# Patient Record
Sex: Male | Born: 1935
Health system: Southern US, Community
[De-identification: ages and names within clinical notes are randomized; demographics above are authoritative.]

## PROBLEM LIST (undated history)

## (undated) DIAGNOSIS — I5022 Chronic systolic (congestive) heart failure: Secondary | ICD-10-CM

## (undated) DIAGNOSIS — E785 Hyperlipidemia, unspecified: Secondary | ICD-10-CM

## (undated) DIAGNOSIS — C679 Malignant neoplasm of bladder, unspecified: Secondary | ICD-10-CM

## (undated) DIAGNOSIS — I959 Hypotension, unspecified: Secondary | ICD-10-CM

## (undated) DIAGNOSIS — R001 Bradycardia, unspecified: Secondary | ICD-10-CM

## (undated) DIAGNOSIS — I10 Essential (primary) hypertension: Secondary | ICD-10-CM

## (undated) DIAGNOSIS — I2109 ST elevation (STEMI) myocardial infarction involving other coronary artery of anterior wall: Secondary | ICD-10-CM

## (undated) DIAGNOSIS — R739 Hyperglycemia, unspecified: Secondary | ICD-10-CM

## (undated) DIAGNOSIS — I480 Paroxysmal atrial fibrillation: Secondary | ICD-10-CM

## (undated) DIAGNOSIS — M1990 Unspecified osteoarthritis, unspecified site: Secondary | ICD-10-CM

## (undated) DIAGNOSIS — I255 Ischemic cardiomyopathy: Secondary | ICD-10-CM

## (undated) DIAGNOSIS — I2510 Atherosclerotic heart disease of native coronary artery without angina pectoris: Secondary | ICD-10-CM

## (undated) DIAGNOSIS — F0390 Unspecified dementia without behavioral disturbance: Secondary | ICD-10-CM

## (undated) HISTORY — DX: Hypotension, unspecified: I95.9

## (undated) HISTORY — DX: Malignant neoplasm of bladder, unspecified: C67.9

## (undated) HISTORY — DX: Hyperlipidemia, unspecified: E78.5

## (undated) HISTORY — DX: Ischemic cardiomyopathy: I25.5

## (undated) HISTORY — DX: Chronic systolic (congestive) heart failure: I50.22

## (undated) HISTORY — PX: JOINT REPLACEMENT: SHX530

## (undated) HISTORY — DX: Bradycardia, unspecified: R00.1

## (undated) HISTORY — DX: Essential (primary) hypertension: I10

## (undated) HISTORY — PX: CORONARY ANGIOPLASTY WITH STENT PLACEMENT: SHX49

## (undated) HISTORY — PX: CORONARY ARTERY BYPASS GRAFT: SHX141

## (undated) HISTORY — DX: Paroxysmal atrial fibrillation: I48.0

## (undated) HISTORY — DX: Hyperglycemia, unspecified: R73.9

## (undated) HISTORY — PX: TOTAL HIP ARTHROPLASTY: SHX124

---
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
## 1988-10-18 HISTORY — PX: CATARACT EXTRACTION W/ INTRAOCULAR LENS  IMPLANT, BILATERAL: SHX1307

## 1990-02-17 HISTORY — PX: TOTAL HIP ARTHROPLASTY: SHX124

## 1997-12-30 ENCOUNTER — Inpatient Hospital Stay (HOSPITAL_COMMUNITY): Admission: EM | Admit: 1997-12-30 | Discharge: 1998-01-09 | Payer: Self-pay | Admitting: Emergency Medicine

## 1998-01-31 ENCOUNTER — Encounter: Admission: RE | Admit: 1998-01-31 | Discharge: 1998-01-31 | Payer: Self-pay | Admitting: Infectious Diseases

## 1998-03-05 ENCOUNTER — Encounter: Admission: RE | Admit: 1998-03-05 | Discharge: 1998-03-05 | Payer: Self-pay | Admitting: Infectious Diseases

## 1998-03-12 ENCOUNTER — Encounter: Admission: RE | Admit: 1998-03-12 | Discharge: 1998-03-28 | Payer: Self-pay | Admitting: Neurosurgery

## 1998-05-07 ENCOUNTER — Encounter: Admission: RE | Admit: 1998-05-07 | Discharge: 1998-05-07 | Payer: Self-pay | Admitting: Infectious Diseases

## 2001-09-20 ENCOUNTER — Encounter: Admission: RE | Admit: 2001-09-20 | Discharge: 2001-09-20 | Payer: Self-pay | Admitting: Orthopedic Surgery

## 2001-09-20 ENCOUNTER — Encounter: Payer: Self-pay | Admitting: Orthopedic Surgery

## 2002-03-21 ENCOUNTER — Encounter: Payer: Self-pay | Admitting: Orthopedic Surgery

## 2002-03-21 ENCOUNTER — Encounter: Admission: RE | Admit: 2002-03-21 | Discharge: 2002-03-21 | Payer: Self-pay | Admitting: Orthopedic Surgery

## 2002-07-19 ENCOUNTER — Encounter: Payer: Self-pay | Admitting: Orthopedic Surgery

## 2002-07-19 ENCOUNTER — Encounter: Admission: RE | Admit: 2002-07-19 | Discharge: 2002-07-19 | Payer: Self-pay | Admitting: Orthopedic Surgery

## 2002-08-30 ENCOUNTER — Encounter: Payer: Self-pay | Admitting: Emergency Medicine

## 2002-08-30 ENCOUNTER — Inpatient Hospital Stay (HOSPITAL_COMMUNITY): Admission: EM | Admit: 2002-08-30 | Discharge: 2002-09-02 | Payer: Self-pay | Admitting: Unknown Physician Specialty

## 2002-12-05 ENCOUNTER — Encounter: Admission: RE | Admit: 2002-12-05 | Discharge: 2002-12-05 | Payer: Self-pay | Admitting: Orthopedic Surgery

## 2002-12-05 ENCOUNTER — Encounter: Payer: Self-pay | Admitting: Orthopedic Surgery

## 2003-12-25 ENCOUNTER — Inpatient Hospital Stay (HOSPITAL_COMMUNITY): Admission: EM | Admit: 2003-12-25 | Discharge: 2003-12-27 | Payer: Self-pay | Admitting: Emergency Medicine

## 2005-02-17 ENCOUNTER — Inpatient Hospital Stay (HOSPITAL_COMMUNITY): Admission: EM | Admit: 2005-02-17 | Discharge: 2005-02-20 | Payer: Self-pay | Admitting: Emergency Medicine

## 2005-02-17 DIAGNOSIS — C679 Malignant neoplasm of bladder, unspecified: Secondary | ICD-10-CM

## 2005-02-17 HISTORY — DX: Malignant neoplasm of bladder, unspecified: C67.9

## 2005-11-25 ENCOUNTER — Encounter (INDEPENDENT_AMBULATORY_CARE_PROVIDER_SITE_OTHER): Payer: Self-pay | Admitting: *Deleted

## 2005-11-25 ENCOUNTER — Ambulatory Visit (HOSPITAL_BASED_OUTPATIENT_CLINIC_OR_DEPARTMENT_OTHER): Admission: RE | Admit: 2005-11-25 | Discharge: 2005-11-25 | Payer: Self-pay | Admitting: Urology

## 2005-12-04 ENCOUNTER — Encounter: Admission: RE | Admit: 2005-12-04 | Discharge: 2005-12-04 | Payer: Self-pay | Admitting: Orthopedic Surgery

## 2006-02-23 ENCOUNTER — Inpatient Hospital Stay (HOSPITAL_COMMUNITY): Admission: RE | Admit: 2006-02-23 | Discharge: 2006-02-26 | Payer: Self-pay | Admitting: Orthopedic Surgery

## 2006-09-18 HISTORY — PX: CARDIAC CATHETERIZATION: SHX172

## 2006-11-06 ENCOUNTER — Inpatient Hospital Stay (HOSPITAL_COMMUNITY): Admission: EM | Admit: 2006-11-06 | Discharge: 2006-11-07 | Payer: Self-pay | Admitting: Emergency Medicine

## 2006-11-18 ENCOUNTER — Inpatient Hospital Stay (HOSPITAL_COMMUNITY): Admission: EM | Admit: 2006-11-18 | Discharge: 2006-11-19 | Payer: Self-pay | Admitting: Emergency Medicine

## 2007-10-09 ENCOUNTER — Emergency Department (HOSPITAL_COMMUNITY): Admission: EM | Admit: 2007-10-09 | Discharge: 2007-10-09 | Payer: Self-pay | Admitting: Emergency Medicine

## 2007-11-16 ENCOUNTER — Inpatient Hospital Stay (HOSPITAL_COMMUNITY): Admission: EM | Admit: 2007-11-16 | Discharge: 2007-11-17 | Payer: Self-pay | Admitting: Emergency Medicine

## 2009-09-17 DIAGNOSIS — I2109 ST elevation (STEMI) myocardial infarction involving other coronary artery of anterior wall: Secondary | ICD-10-CM

## 2009-09-17 HISTORY — DX: ST elevation (STEMI) myocardial infarction involving other coronary artery of anterior wall: I21.09

## 2010-04-18 ENCOUNTER — Inpatient Hospital Stay (HOSPITAL_BASED_OUTPATIENT_CLINIC_OR_DEPARTMENT_OTHER)
Admission: RE | Admit: 2010-04-18 | Discharge: 2010-04-18 | Disposition: A | Discharge status: Home / Self Care | Payer: Medicare Other | Source: Ambulatory Visit | Attending: Interventional Cardiology | Admitting: Interventional Cardiology

## 2010-04-18 DIAGNOSIS — I25810 Atherosclerosis of coronary artery bypass graft(s) without angina pectoris: Secondary | ICD-10-CM | POA: Insufficient documentation

## 2010-04-18 DIAGNOSIS — T82897A Other specified complication of cardiac prosthetic devices, implants and grafts, initial encounter: Secondary | ICD-10-CM | POA: Insufficient documentation

## 2010-04-18 DIAGNOSIS — Y831 Surgical operation with implant of artificial internal device as the cause of abnormal reaction of the patient, or of later complication, without mention of misadventure at the time of the procedure: Secondary | ICD-10-CM | POA: Insufficient documentation

## 2010-04-18 DIAGNOSIS — I2582 Chronic total occlusion of coronary artery: Secondary | ICD-10-CM | POA: Insufficient documentation

## 2010-04-23 ENCOUNTER — Encounter (INDEPENDENT_AMBULATORY_CARE_PROVIDER_SITE_OTHER): Payer: Medicare Other | Admitting: Surgery

## 2010-04-23 DIAGNOSIS — I2510 Atherosclerotic heart disease of native coronary artery without angina pectoris: Secondary | ICD-10-CM

## 2010-04-25 ENCOUNTER — Telehealth: Payer: Self-pay | Admitting: Surgery

## 2010-04-26 NOTE — Consult Note (Signed)
NEW PATIENT CONSULTATION  Shawn [name], Shawn [name] DOB:  [birth year]                                        April 25, 2010 CHART #:  [id number]  REFERRING PHYSICIAN:  Lyn Records, M.D.  REASON FOR CONSULTATION:  Severe multivessel native coronary artery disease and saphenous vein graft disease status post coronary artery bypass graft surgery in 1992.  CLINICAL HISTORY:  I was asked to by Dr. Katrinka Blazing to evaluate the patient for consideration of redo coronary artery bypass graft surgery.  He is a 75 year old gentleman who underwent coronary artery bypass graft surgery in April 07, 1990, by Dr. Particia Lather.  He had a left internal mammary graft to the obtuse marginal as well as saphenous vein graft to the LAD and a saphenous vein graft to the right coronary artery.  I reviewed his previous operative note and it did not completely explain why his left internal mammary graft was placed to the obtuse marginal instead of the LAD, but I suspect it was due to an acute occlusion of the left circumflex and the fact that the LAD and right coronary artery has only had about 60% stenosis in them.  The patient subsequently had multiple further procedures on his heart.  He was noted to have an occluded left internal mammary graft to the obtuse marginal as well as an occluded saphenous vein graft to the right coronary patent IMA.  He underwent stenting of his left circumflex with a Cypher stent in 2004 and 2005.  He had a bare metal stent to the saphenous vein graft to the LAD in 2008.  He presented to [facility] in [place] in August 2011 with myocardial infarction and had 2 further stents placed in the saphenous vein graft to the LAD.  He was seen by Dr. Katrinka Blazing on April 11, 2010, for routine followup and it was felt that he should undergo repeat catheterization to reevaluate his coronaries and saphenous vein graft given his intervention in August and his  prior problems.  The patient denied any symptoms.  He did undergo cardiac catheterization on April 18, 2010.  The native coronary showed that the left main was widely patent.  The LAD was totally occluded after a large branching first septal perforator.  The left circumflex was a dominant vessel that trifurcated on the left lateral wall.  It contains stents in the mid body of the left circumflex as well as the obtuse marginal. There was diffuse disease throughout the entire midregion including a moderate degree of in-stent restenosis with obstruction of the 50%.  The right coronary artery was totally occluded in midvessel with some collaterals to the distal vessel.  The distal right coronary artery appeared fairly small in all shots.  It was unclear if this was due to underfilling.  The saphenous vein graft to the right coronary artery was totally occluded.  Saphenous vein graft to the LAD had multiple stents including overlapping stents and a segmental long stent in the distal graft.  Distally at the anastomosis, there was diffuse in-stent restenosis up to 70% and a focal eccentric 90% plus stenosis in the midbody of the saphenous vein graft in a previously unstented segment. The right internal mammary artery was widely patent.  REVIEW OF SYSTEMS:  GENERAL:  He denies any fever or chills.  He had no recent weight changes.  He denies fatigue and cough at least three times per week. EYES:  Negative. ENT:  He is hard of hearing.  He wears bilateral hearing aids. ENDOCRINE:  He denies diabetes and hypothyroidism. CARDIOVASCULAR:  He denies any chest pain or pressure.  He has had no PND or orthopnea.  He denies exertional dyspnea.  He does have a history of atrial flutter that he says he was diagnosed about 8 months ago and he has been on Coumadin since.  He denies peripheral edema. RESPIRATORY:  Denies cough and sputum production. GI:  He has had no nausea or vomiting.  Denies melena and  bright red blood per rectum. GU:  He denies dysuria and hematuria. MUSCULOSKELETAL:  He does have arthralgias. VASCULAR:  He denies claudication or phlebitis. NEUROLOGICAL:  He denies any focal weakness or numbness.  He denies dizziness and syncope.  He has never had TIA or stroke. HEMATOLOGICAL:  Negative.  ALLERGIES:  Penicillin, which causes a rash.  MEDICATIONS:  Colchicine 0.6 mg p.r.n., vitamin D 2000 units daily, Zocor 80 mg daily, atenolol 25 mg daily, Coumadin 5 mg daily, Plavix 75 mg daily, sublingual nitroglycerin p.r.n., and aspirin 81 mg daily.  PAST MEDICAL HISTORY:  Hypertension, hyperlipidemia.  He has history of coronary artery disease status post coronary artery bypass graft surgery in 1992.  As mentioned above, he is status post multiple percutaneous interventions.  He has history of bladder cancer in 2007 with no recurrence.  He has a history of vertebral osteomyelitis and diskitis in the past.  He has a history of impaired fasting glucose.  He has a history of hearing loss.  He has a history of bilateral total hip replacement.  He has a history of cataracts.  He is status post appendectomy.  FAMILY HISTORY:  His father died of stroke and high blood pressure. Mother died after surgery for brain tumor.  He has a brother who is alive with leukemia and congestive heart failure.  SOCIAL HISTORY:  He is married and retired.  He has 3 adult children. He is a previous smoker who quit age 87.  He denies alcohol use.  PHYSICAL EXAMINATION:  General:  He is a well-developed, elderly white male in no distress.  Vital Signs:  Blood pressure is 122/75, pulse is 45 and regular, respiratory rate is 16 and unlabored, oxygen saturation on room air is 99%.  HEENT:  Normocephalic and atraumatic.  Pupils are equal and reactive to light and accommodation.  Extraocular muscles are intact.  Oropharynx is clear.  Neck:  Normal carotid pulses bilaterally. There are bruits.  There is  no adenopathy or thyromegaly.  Cardiac: Regular rate and rhythm with normal S1 and S2.  There is no murmur, rub, or gallop.  Lungs:  Clear.  There is well-healed sternotomy incision. Sternum is stable.  Abdomen:  Active bowel sounds.  Abdomen is soft, flat, and nontender.  There are no palpable masses or organomegaly. Extremity:  No peripheral edema.  There is a right lower leg saphenous vein harvest incision from the ankle to the knee.  Pedal pulses are palpable bilaterally.  Skin:  Warm and dry.  Neurologic:  Shows him to be alert and oriented x3.  Motor and sensory exam is grossly normal.  IMPRESSION:  The patient has severe native three-vessel coronary disease with a highly diseased saphenous vein graft supplying the left anterior descending and diagonal system.  This vein graft has had multiple percutaneous interventions and currently has a 90% midgraft stenosis in  the unstented segment.  I agree that his best long-term prognosis is going to be with redo coronary artery bypass graft surgery.  I discussed the operative procedure with him including alternatives, benefits, and risks including but not limited to bleeding, blood transfusion, infection, stroke, myocardial infarction, graft failure, and death.  He understands all this and agrees to proceed.  He is going to talk this over with his wife and then will call our office to schedule surgery in the near future.  Evelene Croon, M.D. Electronically Signed  BB/MEDQ  D:  04/25/2010  T:  04/26/2010  Job:  [id number]  cc:   Theressa Millard, M.D. Lyn Records, M.D.

## 2010-04-26 NOTE — Procedures (Signed)
Shawn [name], PAYSON NO.:  [id number]  MEDICAL RECORD NO.:  [id number]          PATIENT TYPE:  LOCATION:                                 FACILITY:  PHYSICIAN:  Lyn Records, M.D.   DATE OF BIRTH:  12/09/1935  DATE OF PROCEDURE:  04/18/2010 DATE OF DISCHARGE:                           CARDIAC CATHETERIZATION   INDICATIONS FOR PROCEDURE:  The patient has a history of coronary artery disease.  He had bypass surgery in 1992 by Dr. Particia Lather.  At that time, he received a left internal mammary graft to the obtuse marginal, saphenous vein graft to the LAD, and saphenous vein graft to the RCA. Subsequent catheterizations have identified atresia of the LIMA to the obtuse marginal requiring stenting with drug-eluting stent in 2004 and 2005.  Those stents have remained patent.  He has subsequently been demonstrated to have occlusion of the saphenous vein graft to the RCA and has had recurrent occlusion with 3 separate episodes of acute coronary syndrome or acute infarction involving the saphenous vein graft to the LAD.  He received a bare metal stent to the SVG in 2008.  He required restenting during an acute anterior MI in August 2011 where a 32-mm long bare metal stent was again placed in this graft.  The patient is asymptomatic at this time, but is at high risk for restenosis.  This cath is being done to rule out restenosis in the saphenous vein graft and to consider preemptive alternative revascularization.  PROCEDURE PERFORMED: 1. Left heart catheterization. 2. Selective coronary angiography. 3. Left ventriculography. 4. Right internal mammary graft angiography. 5. Saphenous vein graft angiography.  DESCRIPTION:  [name] was sterilely prepped and draped.  A time-out was confirmed.  He has understanding of the indication for the procedure.  He was given Versed and fentanyl for sedation.  We then performed heart catheterization using 4-French  equipment from the right femoral artery.  The 4-French sheath was placed by the modified Seldinger technique.  A 4-French A2 multipurpose catheter was used for hemodynamic recordings, left ventriculography hand injection, native right coronary angiography, and saphenous vein graft angiography.  A 4- [place] #4 left Judkins catheter was used for left coronary angiography. A 4-French internal mammary catheter was used for right internal mammary artery angiography.  The patient tolerated the procedure without complications.  RESULTS: 1. Hemodynamic data:     a.     Aortic pressure 108/50.     b.     Left ventricular pressure 110/14. 2. Left ventriculography:  Left ventricle is mildly dilated.  Anterior     and inferior wall hypokinesis is noted.  EF is 35% to 40%.  No     obvious mitral regurgitation is seen. 3. Coronary angiography.     a.     Left main coronary artery:  Widely patent.     b.     Left anterior descending coronary artery:  LAD is totally      occluded after a large branching first septal perforator.  30% to      40% narrowing is noted in the proximal LAD.  The septal perforator  and its branches supply collaterals to the distal RCA in the AV      groove.     c.     Circumflex coronary artery:  The circumflex is dominant      vessel that trifurcates on the left lateral wall.  It contains      stents in the midbody of the circumflex/obtuse marginal.  Diffuse      disease throughout the entire mid region is noted including      moderate degree of in-stent restenosis with obstruction up to 50%.     d.     Right coronary artery:  Totally occluded in the midvessel      also giving formal collaterals to the distal right coronary      artery.  The distal right coronary artery bed appears relatively      small in all shots potentially because of underfilling due to      multiple sources of collateral flow. 4. Saphenous vein graft angiography:     a.     SVG to RCA:  Totally  occluded.     b.     Saphenous vein graft to LAD:  Multiple stents including      overlapping stents in the proximal LAD is noted and segmental long      stent is noted in the distal graft extending into the LAD.      Distally at the anastomosis, there is diffuse in-stent restenosis      with up to 70% narrowing.  There is a focal eccentric 90 plus      percent stenosis in the midbody of the saphenous vein graft in a      previously unstented segment. 5. RIMA:  The right internal mammary is widely patent.  CONCLUSIONS: 1. Saphenous vein graft failure with restenosis and high-grade new     native vessel disease in the SVG to the LAD.  This graft contains     multiple bare metal stents and has been the source of recurring     episodes of ACS including more recently had an acute anterior     infarct in August 2011. 2. Patent native circumflex with generalized 50% obstruction     throughout the mid segment of this vessel.  Patent drug-eluting     stents were present.  The right coronary artery is totally     occluded.  The native LAD is totally occluded. 3. Total occlusion of saphenous vein graft to the right coronary     artery. 4. Widely patent right internal mammary. 5. Mild-to-moderate left ventricular dysfunction.  PLAN:  Referral to [facility] for consideration of right internal mammary grafting to the LAD diagonal versus continued restenting of this 73 year old graft, which seems unreasonable.  The patient is currently asymptomatic so we have a little time, but I will try to get him in to see a surgeon as soon as possible.     Lyn Records, M.D.     HWS/MEDQ  D:  04/18/2010  T:  04/18/2010  Job:  [id number]  cc:   [facility]  Electronically Signed by Verdis Prime M.D. on 04/25/2010 56:21:30 AM

## 2010-04-29 NOTE — Telephone Encounter (Signed)
Spoke with patient on 04/26/10 to provide him with instructions for discontinuance of his Plavix and Coumadin in preparation for his 05/02/10 surgery with Dr. Laneta Simmers.

## 2010-04-30 ENCOUNTER — Ambulatory Visit (HOSPITAL_COMMUNITY)
Admission: RE | Admit: 2010-04-30 | Discharge: 2010-04-30 | Disposition: A | Discharge status: Home / Self Care | Payer: Medicare Other | Source: Ambulatory Visit | Attending: Surgery | Admitting: Surgery

## 2010-04-30 ENCOUNTER — Encounter (HOSPITAL_COMMUNITY)
Admission: RE | Admit: 2010-04-30 | Discharge: 2010-04-30 | Disposition: A | Discharge status: Home / Self Care | Payer: Medicare Other | Source: Ambulatory Visit | Attending: Surgery | Admitting: Surgery

## 2010-04-30 ENCOUNTER — Other Ambulatory Visit: Payer: Self-pay | Admitting: Surgery

## 2010-04-30 DIAGNOSIS — Z01812 Encounter for preprocedural laboratory examination: Secondary | ICD-10-CM | POA: Insufficient documentation

## 2010-04-30 DIAGNOSIS — I2510 Atherosclerotic heart disease of native coronary artery without angina pectoris: Secondary | ICD-10-CM

## 2010-04-30 DIAGNOSIS — Z01811 Encounter for preprocedural respiratory examination: Secondary | ICD-10-CM | POA: Insufficient documentation

## 2010-04-30 DIAGNOSIS — Z01810 Encounter for preprocedural cardiovascular examination: Secondary | ICD-10-CM | POA: Insufficient documentation

## 2010-04-30 DIAGNOSIS — Z01818 Encounter for other preprocedural examination: Secondary | ICD-10-CM | POA: Insufficient documentation

## 2010-04-30 LAB — COMPREHENSIVE METABOLIC PANEL
Albumin: 3.5 g/dL (ref 3.5–5.2)
BUN: 16 mg/dL (ref 6–23)
Creatinine, Ser: 0.93 mg/dL (ref 0.4–1.5)
Glucose, Bld: 129 mg/dL — ABNORMAL HIGH (ref 70–99)
Total Bilirubin: 1 mg/dL (ref 0.3–1.2)
Total Protein: 6.1 g/dL (ref 6.0–8.3)

## 2010-04-30 LAB — BLOOD GAS, ARTERIAL
Acid-Base Excess: 2.4 mmol/L — ABNORMAL HIGH (ref 0.0–2.0)
Drawn by: 181601
pCO2 arterial: 38.6 mmHg (ref 35.0–45.0)
pH, Arterial: 7.446 (ref 7.350–7.450)
pO2, Arterial: 117 mmHg — ABNORMAL HIGH (ref 80.0–100.0)

## 2010-04-30 LAB — URINALYSIS, ROUTINE W REFLEX MICROSCOPIC
Nitrite: NEGATIVE
Specific Gravity, Urine: 1.022 (ref 1.005–1.030)
Urobilinogen, UA: 0.2 mg/dL (ref 0.0–1.0)
pH: 6.5 (ref 5.0–8.0)

## 2010-04-30 LAB — CBC
Platelets: 209 10*3/uL (ref 150–400)
RBC: 4.61 MIL/uL (ref 4.22–5.81)
WBC: 8.2 10*3/uL (ref 4.0–10.5)

## 2010-04-30 LAB — APTT: aPTT: 34 seconds (ref 24–37)

## 2010-04-30 LAB — SURGICAL PCR SCREEN: Staphylococcus aureus: NEGATIVE

## 2010-04-30 LAB — PROTIME-INR: INR: 1.49 (ref 0.00–1.49)

## 2010-05-01 LAB — HEMOGLOBIN A1C: Mean Plasma Glucose: 148 mg/dL — ABNORMAL HIGH (ref <117)

## 2010-05-02 ENCOUNTER — Inpatient Hospital Stay (HOSPITAL_COMMUNITY): Payer: Medicare Other

## 2010-05-02 ENCOUNTER — Inpatient Hospital Stay (HOSPITAL_COMMUNITY)
Admission: RE | Admit: 2010-05-02 | Discharge: 2010-05-06 | DRG: 236 | Disposition: A | Discharge status: Home / Self Care | Payer: Medicare Other | Source: Ambulatory Visit | Attending: Surgery | Admitting: Surgery

## 2010-05-02 DIAGNOSIS — I4892 Unspecified atrial flutter: Secondary | ICD-10-CM | POA: Diagnosis present

## 2010-05-02 DIAGNOSIS — I4891 Unspecified atrial fibrillation: Secondary | ICD-10-CM | POA: Diagnosis present

## 2010-05-02 DIAGNOSIS — Z96649 Presence of unspecified artificial hip joint: Secondary | ICD-10-CM

## 2010-05-02 DIAGNOSIS — H9190 Unspecified hearing loss, unspecified ear: Secondary | ICD-10-CM | POA: Diagnosis present

## 2010-05-02 DIAGNOSIS — Z880 Allergy status to penicillin: Secondary | ICD-10-CM

## 2010-05-02 DIAGNOSIS — Z7982 Long term (current) use of aspirin: Secondary | ICD-10-CM

## 2010-05-02 DIAGNOSIS — I10 Essential (primary) hypertension: Secondary | ICD-10-CM | POA: Diagnosis present

## 2010-05-02 DIAGNOSIS — Z951 Presence of aortocoronary bypass graft: Secondary | ICD-10-CM

## 2010-05-02 DIAGNOSIS — I2510 Atherosclerotic heart disease of native coronary artery without angina pectoris: Secondary | ICD-10-CM

## 2010-05-02 DIAGNOSIS — Z87891 Personal history of nicotine dependence: Secondary | ICD-10-CM

## 2010-05-02 DIAGNOSIS — E785 Hyperlipidemia, unspecified: Secondary | ICD-10-CM | POA: Diagnosis present

## 2010-05-02 DIAGNOSIS — Z7901 Long term (current) use of anticoagulants: Secondary | ICD-10-CM

## 2010-05-02 DIAGNOSIS — Z8551 Personal history of malignant neoplasm of bladder: Secondary | ICD-10-CM

## 2010-05-02 LAB — POCT I-STAT 4, (NA,K, GLUC, HGB,HCT)
Glucose, Bld: 120 mg/dL — ABNORMAL HIGH (ref 70–99)
Glucose, Bld: 90 mg/dL (ref 70–99)
Glucose, Bld: 107 mg/dL — ABNORMAL HIGH (ref 70–99)
Glucose, Bld: 95 mg/dL (ref 70–99)
HCT: 37 % — ABNORMAL LOW (ref 39.0–52.0)
HCT: 30 % — ABNORMAL LOW (ref 39.0–52.0)
HCT: 28 % — ABNORMAL LOW (ref 39.0–52.0)
HCT: 31 % — ABNORMAL LOW (ref 39.0–52.0)
Hemoglobin: 12.9 g/dL — ABNORMAL LOW (ref 13.0–17.0)
Hemoglobin: 12.6 g/dL — ABNORMAL LOW (ref 13.0–17.0)
Hemoglobin: 10.5 g/dL — ABNORMAL LOW (ref 13.0–17.0)
Potassium: 4.1 mEq/L (ref 3.5–5.1)
Potassium: 4.2 mEq/L (ref 3.5–5.1)
Potassium: 3.9 mEq/L (ref 3.5–5.1)
Potassium: 4 mEq/L (ref 3.5–5.1)
Sodium: 143 mEq/L (ref 135–145)
Sodium: 143 mEq/L (ref 135–145)
Sodium: 139 mEq/L (ref 135–145)
Sodium: 142 mEq/L (ref 135–145)

## 2010-05-02 LAB — HEMOGLOBIN AND HEMATOCRIT, BLOOD: Hemoglobin: 9.4 g/dL — ABNORMAL LOW (ref 13.0–17.0)

## 2010-05-02 LAB — CREATININE, SERUM
GFR calc Af Amer: >60 mL/min (ref >60)
GFR calc non Af Amer: >60 mL/min (ref >60)

## 2010-05-02 LAB — POCT I-STAT, CHEM 8
BUN: 15 mg/dL (ref 6–23)
Hemoglobin: 11.2 g/dL — ABNORMAL LOW (ref 13.0–17.0)
Potassium: 5.2 mEq/L — ABNORMAL HIGH (ref 3.5–5.1)
Sodium: 142 mEq/L (ref 135–145)
TCO2: 22 mmol/L (ref 0–100)

## 2010-05-02 LAB — MAGNESIUM: Magnesium: 2.8 mg/dL — ABNORMAL HIGH (ref 1.5–2.5)

## 2010-05-02 LAB — CBC
HCT: 30.4 % — ABNORMAL LOW (ref 39.0–52.0)
Hemoglobin: 10 g/dL — ABNORMAL LOW (ref 13.0–17.0)
Hemoglobin: 11.1 g/dL — ABNORMAL LOW (ref 13.0–17.0)
MCHC: 32.9 g/dL (ref 30.0–36.0)
MCV: 93.1 fL (ref 78.0–100.0)
Platelets: 133 10*3/uL — ABNORMAL LOW (ref 150–400)
RBC: 3.25 MIL/uL — ABNORMAL LOW (ref 4.22–5.81)
RBC: 3.6 MIL/uL — ABNORMAL LOW (ref 4.22–5.81)
WBC: 13.4 10*3/uL — ABNORMAL HIGH (ref 4.0–10.5)
WBC: 18.4 10*3/uL — ABNORMAL HIGH (ref 4.0–10.5)

## 2010-05-02 LAB — POCT I-STAT 3, ART BLOOD GAS (G3+)
Acid-Base Excess: 1 mmol/L (ref 0.0–2.0)
Acid-base deficit: 1 mmol/L (ref 0.0–2.0)
Bicarbonate: 27.6 mEq/L — ABNORMAL HIGH (ref 20.0–24.0)
Bicarbonate: 26.3 mEq/L — ABNORMAL HIGH (ref 20.0–24.0)
O2 Saturation: 98 %
pCO2 arterial: 54.3 mmHg — ABNORMAL HIGH (ref 35.0–45.0)
pCO2 arterial: 42.9 mmHg (ref 35.0–45.0)
pH, Arterial: 7.333 — ABNORMAL LOW (ref 7.350–7.450)
pH, Arterial: 7.286 — ABNORMAL LOW (ref 7.350–7.450)
pO2, Arterial: 92 mmHg (ref 80.0–100.0)

## 2010-05-02 LAB — GLUCOSE, CAPILLARY
Glucose-Capillary: 117 mg/dL — ABNORMAL HIGH (ref 70–99)
Glucose-Capillary: 144 mg/dL — ABNORMAL HIGH (ref 70–99)

## 2010-05-02 LAB — PROTIME-INR
INR: 1.75 — ABNORMAL HIGH (ref 0.00–1.49)
Prothrombin Time: 20.6 seconds — ABNORMAL HIGH (ref 11.6–15.2)

## 2010-05-02 LAB — APTT: aPTT: 36 seconds (ref 24–37)

## 2010-05-02 LAB — PLATELET COUNT: Platelets: 104 10*3/uL — ABNORMAL LOW (ref 150–400)

## 2010-05-03 ENCOUNTER — Inpatient Hospital Stay (HOSPITAL_COMMUNITY): Payer: Medicare Other

## 2010-05-03 LAB — CBC
MCV: 93.8 fL (ref 78.0–100.0)
Platelets: 140 10*3/uL — ABNORMAL LOW (ref 150–400)
Platelets: 119 10*3/uL — ABNORMAL LOW (ref 150–400)
RDW: 15 % (ref 11.5–15.5)
RDW: 15.5 % (ref 11.5–15.5)
WBC: 22.3 10*3/uL — ABNORMAL HIGH (ref 4.0–10.5)
WBC: 14.5 10*3/uL — ABNORMAL HIGH (ref 4.0–10.5)

## 2010-05-03 LAB — BASIC METABOLIC PANEL
GFR calc Af Amer: >60 mL/min (ref >60)
GFR calc non Af Amer: >60 mL/min (ref >60)
Potassium: 4.7 mEq/L (ref 3.5–5.1)
Sodium: 143 mEq/L (ref 135–145)

## 2010-05-03 LAB — GLUCOSE, CAPILLARY
Glucose-Capillary: 163 mg/dL — ABNORMAL HIGH (ref 70–99)
Glucose-Capillary: 190 mg/dL — ABNORMAL HIGH (ref 70–99)

## 2010-05-03 LAB — MAGNESIUM: Magnesium: 2 mg/dL (ref 1.5–2.5)

## 2010-05-03 LAB — CREATININE, SERUM: GFR calc non Af Amer: >60 mL/min (ref >60)

## 2010-05-04 ENCOUNTER — Inpatient Hospital Stay (HOSPITAL_COMMUNITY): Payer: Medicare Other

## 2010-05-04 LAB — TYPE AND SCREEN
ABO/RH(D): O POS
Antibody Screen: NEGATIVE
Unit division: 0
Unit division: 0

## 2010-05-04 LAB — BASIC METABOLIC PANEL
CO2: 29 mEq/L (ref 19–32)
Glucose, Bld: 139 mg/dL — ABNORMAL HIGH (ref 70–99)
Potassium: 4.7 mEq/L (ref 3.5–5.1)
Sodium: 140 mEq/L (ref 135–145)

## 2010-05-04 LAB — GLUCOSE, CAPILLARY
Glucose-Capillary: 123 mg/dL — ABNORMAL HIGH (ref 70–99)
Glucose-Capillary: 119 mg/dL — ABNORMAL HIGH (ref 70–99)

## 2010-05-04 LAB — CBC
HCT: 31.8 % — ABNORMAL LOW (ref 39.0–52.0)
Hemoglobin: 10.5 g/dL — ABNORMAL LOW (ref 13.0–17.0)
MCHC: 33 g/dL (ref 30.0–36.0)
WBC: 12.1 10*3/uL — ABNORMAL HIGH (ref 4.0–10.5)

## 2010-05-05 LAB — GLUCOSE, CAPILLARY: Glucose-Capillary: 178 mg/dL — ABNORMAL HIGH (ref 70–99)

## 2010-05-06 LAB — BASIC METABOLIC PANEL
BUN: 20 mg/dL (ref 6–23)
CO2: 29 mEq/L (ref 19–32)
Calcium: 8 mg/dL — ABNORMAL LOW (ref 8.4–10.5)
Chloride: 105 mEq/L (ref 96–112)
Creatinine, Ser: 0.96 mg/dL (ref 0.4–1.5)
GFR calc Af Amer: >60 mL/min (ref >60)
GFR calc non Af Amer: >60 mL/min (ref >60)
Glucose, Bld: 97 mg/dL (ref 70–99)
Potassium: 3.8 mEq/L (ref 3.5–5.1)
Sodium: 138 mEq/L (ref 135–145)

## 2010-05-06 LAB — PROTIME-INR
INR: 1.2 (ref 0.00–1.49)
Prothrombin Time: 15.4 seconds — ABNORMAL HIGH (ref 11.6–15.2)

## 2010-05-06 LAB — GLUCOSE, CAPILLARY
Glucose-Capillary: 98 mg/dL (ref 70–99)
Glucose-Capillary: 106 mg/dL — ABNORMAL HIGH (ref 70–99)

## 2010-05-08 NOTE — Op Note (Signed)
NAMEEWARD, RUTIGLIANO             ACCOUNT NO.:  [id number]  MEDICAL RECORD NO.:  [id number]           PATIENT TYPE:  I  LOCATION:  2304                         FACILITY:  [facility]  PHYSICIAN:  Evelene Croon, M.D.     DATE OF BIRTH:  1935-02-18  DATE OF PROCEDURE:  05/02/2010 DATE OF DISCHARGE:                              OPERATIVE REPORT   PREOPERATIVE DIAGNOSIS:  Severe native three-vessel coronary disease with severe saphenous vein graft disease status post coronary bypass graft surgery in 1992.  POSTOPERATIVE DIAGNOSIS:  Severe native three-vessel coronary disease with severe saphenous vein graft disease status post coronary bypass graft surgery in 1992.  OPERATIVE PROCEDURES:  Redo median sternotomy, extracorporeal circulation, redo coronary artery bypass graft surgery x2 using a right internal mammary artery graft to the left anterior descending coronary, and a saphenous vein graft to the obtuse marginal branch of the left circumflex coronary artery.  Endoscopic vein harvesting from the left leg.  SURGEON:  Evelene Croon, MD  ASSISTANT:  Rowe Clack, PA-C  ANESTHESIA:  General endotracheal.  CLINICAL HISTORY:  This patient is a 75 year old gentleman who underwent coronary bypass surgery in February 1992 by Dr. Particia Lather.  He had a left internal mammary graft to the obtuse marginal as well as saphenous vein graft to the LAD and saphenous vein graft to the right coronary artery.  On review of his previous operative note, it sounded like his left internal mammary graft was placed to the obtuse marginal instead of the LAD since it was a culprit vessel with an acute occlusion and the LAD and right coronary artery only had about 60% stenosis in them.  He has subsequently had multiple further procedures on his heart. He was found to have an occluded left internal mammary graft to the obtuse marginal as well as an occluded saphenous vein graft to the right coronary  artery.  He underwent stenting of his left circumflex with a Cypher stent in 2004 and 2005.  He had a bare-metal stent placed in the saphenous vein graft to the LAD in 2008.  He was treated at [facility] in [place] in August 2011 after myocardial infarction and had two further stents placed in the saphenous vein graft to the LAD.  He was seen in February 2012 by Dr. Katrinka Blazing for routine followup and it was felt that he should undergo repeat catheterization to reevaluate his coronaries and grafts given the multiple prior problems with the saphenous vein graft to the LAD.  He denied any symptoms.  Cardiac catheterization on April 18, 2010, showed that the left main was widely patent.  The LAD was totally occluded after a large branching first septal perforator.  The left circumflex was A dominant vessel that trifurcation on the left lateral wall.  It contained stents in the midbody as well as in the obtuse marginal branch.  There is diffuse disease throughout the entire mid region including moderate degree of the in-stent restenosis with obstruction of about 50%.  The right coronary artery was totally occluded in the mid vessel with some collaterals to a tiny thread-like distal vessel.  The saphenous  vein graft to the right coronary artery was totally occluded.  The saphenous vein graft to the LAD had multiple stents including overlapping stents and a segmental long stent in the distal graft.  Distally, at the anastomosis, there was diffuse in-stent restenosis of about 70% and a focal eccentric 90% plus stenosis in the midbody of the saphenous vein graft in a previously unstented segment.  The right internal mammary artery was widely patent.  There was a diagonal branch that was relatively small and had ostial stenosis.  It came off the LAD just beyond the insertion of the vein graft into the LAD.  After review of these films, it was felt that redo coronary bypass to the LAD  was indicated to prevent further ischemia and infarction.  It was not felt that further percutaneous intervention on this vein graft was advised. I agree the best treatment would be a right internal mammary graft to the LAD as well as grafting of the obtuse marginal branch on the lateral wall.  I was not sure whether diagonal or right coronary artery would be graftable.  I discussed the operative procedure with the patient including alternatives, benefits, and risks including but not limited to bleeding, blood transfusion, infection, stroke, myocardial infarction, graft failure, and death.  He understood all this and agreed to proceed.  OPERATIVE PROCEDURE:  The patient was taken to the operating room and placed on the table in supine position.  After induction of general endotracheal anesthesia, a Foley catheter was placed in bladder using sterile technique.  Then, the chest was opened through the previous median sternotomy incision.  Sternal wires were removed, and the sternum opened using oscillating saw without difficulty.  Bone hooks were used to retract sternal edges and the mediastinal structures were dissected from the back of the sternum.  The patient's heart was lying towards the left side and the right lung was crossing over to the left of the midline.  Then, the right internal mammary artery was harvested from the chest wall as a pedicle graft.  This was a large-caliber vessel with excellent blood flow through it.  It was fairly long vessel since the sternum was long.  I felt it would easily cross over to the LAD.  At the same time, a segment of greater saphenous vein was harvested from the left leg using endoscopic vein harvest technique.  This vein was of large caliber and fair quality.  Then dissection was performed to expose the right atrium and ascending aorta.  The patient was then heparinized and when an adequate ACT was obtained, the distal ascending aorta was  cannulated using a 20-French aortic cannula for arterial inflow.  Venous outflow was achieved using a two-stage venous cannula for the right atrial appendage.  Antegrade cardioplegia and vent cannula was inserted in aortic root.  The patient was placed on cardiopulmonary bypass and the remainder of the heart was dissected from the pericardium.  I was not able expose the lateral wall until the heart was stopped.  Then the aorta was crossclamped and 800 mL of cold blood antegrade cardioplegia was administered in the aortic root with quick arrest of the heart. Systemic hypothermia to 32 degrees centigrade and topical hypothermic iced saline was used.  A temperature probe was placed in the septum and insulating pad in the pericardium.  The remainder of the heart was dissected from the pericardium. Examination of the vessels showed that the obtuse marginal was a large vessel even distally.  It was  heavily diseased throughout its proximal and midportions.  The diagonal branch was relatively small and not felt to be graftable with the size of the vein that we had to use.  I felt that using this large vein with a small diagonal, the graft was certainly closed.  The LAD was a large vessel that looked better than it did on catheterization, had no distal disease in it.  The diagonal vein graft was diffusely diseased.  The right coronary artery was identified in its midportion around the area of the previous anastomosis and traced distally.  This was a small thread-like vessel with diffuse disease, and I could never find a visible lumen in the vessel.  I did not feel this would be graftable.  Then, the first distal anastomosis was performed to the obtuse marginal branch.  The internal diameter distally was about 2 mm.  The conduit used was a segment of greater saphenous vein and anastomosis was formed in end-to-side manner using continuous 7-0 Prolene suture.  Flow was noted through the graft and  was excellent.  Then, another dose of cardioplegia was given down the vein graft and in aortic root.  The second distal anastomosis was performed to the mid to distal LAD. The internal diameter of this vessel was 1.75 mm.  Conduit used was the right internal mammary graft and this was brought across the midline over the aorta and anastomosis to the LAD in end-to-side manner using continuous 8-0 Prolene suture.  The pedicle was sutured to the epicardium with 6-0 Prolene sutures.  Then, the single proximal vein graft anastomosis was performed to the mid ascending aorta in end-to- side manner using continuous 6-0 Prolene suture.  The clamp was then removed from mammary pedicle.  There was rapid warming of the ventricular septum and return of spontaneous ventricular fibrillation. Crossclamp was removed with time 69 minutes, and the patient was spontaneously converted to sinus rhythm.  The proximal and distal anastomoses appeared hemostatic and while the grafts were satisfactory. Graft markers were placed around the proximal anastomoses.  Two temporary right ventricular and right atrial pacing wires were placed and brought out through the skin.  When the patient rewarmed to 37 degrees centigrade, he was weaned from cardiopulmonary bypass on no inotropic agents.  Total bypass time was 88 minutes.  Cardiac function appeared excellent with cardiac output of 5.5 liters per minute.  Protamine was given and the venous and aortic clamps were removed without difficulty.  Hemostasis was achieved.  Three chest tubes were placed with tube in the posterior pericardium, one in the right pleural space, and one in the anterior mediastinum.  Sternum was closed with double #6 stainless steel wires.  Fascia was closed with continuous #1 Vicryl suture.  Subcutaneous tissue was closed with continuous 2-0 Vicryl and the skin with 3-0 Vicryl subcuticular closure. The lower extremity vein harvest site was closed  in layers in similar manner.  The sponge, needle, and instrument counts were correct according to the scrub nurse.  Dry sterile dressing were applied over the incisions around the chest tubes which were hooked to Pleur-Evac suction.  The patient remained hemodynamically stable and transferred to the SICU in guarded but stable condition.     Evelene Croon, M.D.     BB/MEDQ  D:  05/02/2010  T:  05/03/2010  Job:  [id number]  cc:   Lyn Records, M.D.  Electronically Signed by Evelene Croon M.D. on 05/08/2010 09:57:39 AM

## 2010-05-23 ENCOUNTER — Other Ambulatory Visit: Payer: Self-pay | Admitting: Surgery

## 2010-05-23 DIAGNOSIS — I2510 Atherosclerotic heart disease of native coronary artery without angina pectoris: Secondary | ICD-10-CM

## 2010-05-27 ENCOUNTER — Ambulatory Visit
Admission: RE | Admit: 2010-05-27 | Discharge: 2010-05-27 | Disposition: A | Discharge status: Home / Self Care | Payer: Medicare Other | Source: Ambulatory Visit | Attending: Surgery | Admitting: Surgery

## 2010-05-27 ENCOUNTER — Encounter (INDEPENDENT_AMBULATORY_CARE_PROVIDER_SITE_OTHER): Payer: Self-pay

## 2010-05-27 DIAGNOSIS — I2510 Atherosclerotic heart disease of native coronary artery without angina pectoris: Secondary | ICD-10-CM

## 2010-05-28 NOTE — Assessment & Plan Note (Signed)
OFFICE VISIT  FERRY, MATTHIS DOB:  15-Oct-1935                                        May 27, 2010 CHART #:  [id number]  HISTORY:  The patient is a 75 year old gentleman with long history of coronary artery disease who was recently admitted and underwent redo coronary artery bypass grafting x2 with a right internal mammary artery graft to the LAD and a saphenous vein graft to the obtuse marginal branch of the circumflex coronary artery.  Postoperatively, he did well. He does have a history of atrial fibrillation and has been continued on amiodarone and Coumadin during the postoperative period.  Currently, he reports that he is doing well with his ambulation.  He is having no significant difficulties related to shortness of breath.  He does have some slight numbness in the left fifth finger, which is improving with time.  There was no motor loss associated with this.  He is having no fevers, chills or other constitutional symptoms.  He is being followed and managed through the [facility] at Dr. Michaelle Copas office.  He is scheduled to stop his amiodarone when the current medications run out. Overall, he feels quite well.  Chest x-ray was obtained on today's date, reveals clear lung fields with no evidence of new or acute findings.  There are no significant effusions.  There are no significant infiltrates.  There is no evidence of congestive failure.  PHYSICAL EXAMINATION:  Vital Signs:  Blood pressure 114/68, pulse 54, respirations 16, oxygen saturation is 98% on room air.  General Appearance:  This a well-developed adult male in no acute distress. Pulmonary:  Reveals clear lungs bilaterally.  Cardiac:  Regular rate and rhythm.  Normal S1 and S2.  No murmurs, gallops, or rubs.  Abdomen: Benign.  Extremity:  Reveals minimal edema on the left lower extremity. Incisions are healing well without evidence of infection.  ASSESSMENT:  Mr. Shawn [name] has  made very quick recovery from his redo surgical revascularization as described above.  I related that his left fifth finger numbness may be related to brachial plexus stretch injury and as it is improving over time, it is felt that it probably will continue to improve on its own.  If he has ongoing difficulty, there may be indication for some occupational therapy down the road.  He will be continued to managed through Dr. Michaelle Copas office regarding his Coumadin and his cardiology issues.  We will see him again on a p.r.n. basis for any new issues related to the surgery or at request.  Rowe Clack, P.A.-C.  Sherryll Burger  D:  05/27/2010  T:  05/28/2010  Job:  [id number]  cc:   Lyn Records, M.D. Theressa Millard, M.D.

## 2010-06-18 NOTE — Discharge Summary (Signed)
Shawn [name], Shawn [name]             ACCOUNT NO.:  [id number]  MEDICAL RECORD NO.:  [id number]           PATIENT TYPE:  I  LOCATION:  2028                         FACILITY:  [facility]  PHYSICIAN:  Evelene Croon, M.D.     DATE OF BIRTH:  [birth year]  DATE OF ADMISSION:  05/02/2010 DATE OF DISCHARGE:  05/06/2010                              DISCHARGE SUMMARY   PRIMARY ADMITTING DIAGNOSES: 1. Severe multivessel native coronary artery disease. 2. Severe saphenous vein graft disease, status post previous coronary     artery bypass grafting in 1992.  ADDITIONAL/DISCHARGE DIAGNOSES: 1. Severe native three-vessel coronary artery disease. 2. Severe saphenous vein graft disease, status post coronary artery     bypass grafting in 1992. 3. History of atrial fibrillation/atrial flutter, on chronic Coumadin. 4. Hypertension. 5. Hyperlipidemia. 6. History of bladder cancer in 2007. 7. History of vertebral osteomyelitis and diskitis. 8. Hearing loss. 9. Status post bilateral total hip replacement. 10.History of cataract. 11.Remote history of tobacco abuse.  PROCEDURES PERFORMED: 1. Redo coronary artery bypass grafting x2 (right internal mammary     artery to the LAD, saphenous vein graft to the obtuse marginal). 2. Endoscopic vein harvest, left leg.  HISTORY OF PRESENT ILLNESS:  The patient is a 75 year old male who is status post previous coronary artery bypass grafting in 1992 by Dr. Andrey Campanile.  Since that time, he has had recurrent disease and has undergone stenting of the left circumflex in 2004 and 2005 with a Cypher stent. He had a bare-metal stent placed in the saphenous vein graft to the LAD in 2008.  He also suffered a myocardial infarction in August 2011 and was treated at [facility] in [place] with placement of 2 further stents in the saphenous vein graft to the LAD.  He was seen in routine followup in February 2012 by Dr. Katrinka Blazing and it was felt that he should undergo  repeat catheterization at that time to reevaluate his coronaries and previous graft given his multiple prior interventions to the saphenous vein graft.  He denied any symptoms of chest pain or shortness of breath.  He subsequently underwent cardiac catheterization on April 18, 2010, which showed widely patent left main.  The LAD was totally occluded after all large branching septal perforator.  The circumflex was a dominant vessel that trifurcated on the left lateral wall and contained stents in the mid body as well as in the obtuse marginal branch.  There was diffuse disease throughout the entire mid region including a moderate degree of in-stent restenosis with obstruction of about 50%.  The right coronary artery was totally occluded and the mid vessel with some collaterals to a tiny thread-like distal vessel.  The saphenous vein graft to the right was totally occluded.  The saphenous vein graft to the LAD had multiple stents including overlapping stents in the segmental long and distal graft. Distally at the anastomosis, there was diffuse in-stent restenosis of about 70% and a focal eccentric 90% plus stenosis in the mid body of the saphenous vein graft in the previously stented segment.  After review of his films, it was felt that he would benefit  from redo coronary artery bypass grafting and that further percutaneous intervention was not advisable.  He was then referred to Dr. Evelene Croon for consideration of surgical revascularization.  Dr. Laneta Simmers agreed with the need for CABG and felt that the best treatment would be to proceed with a right internal mammary artery graft to the LAD as well as a saphenous vein graft to the obtuse marginal.  Also, he felt that the diagonal and the right coronary should be evaluated intraoperatively for possible grafting.  All risks, benefits, and alternatives of surgery were explained to the patient and he did agree to proceed.  HOSPITAL COURSE:  Mr.  Castell was admitted to [facility] on May 02, 2010, and was taken to the operating room where he underwent redo CABG x2 by Dr. Laneta Simmers.  Please see previously dictated operative report for complete details of surgery.  Intraoperatively, the diagonal and the right coronary artery were both observed and were noted to be diffusely diseased and were felt to not be graftable.  The patient tolerated the procedure well and was transferred to the SICU in stable condition.  Postoperatively, he has done well.  He was extubated shortly after surgery.  He was hemodynamically stable and doing well on postop day #1.  He was then transferred to the Step-Down Unit.  His rhythm has generally remained stable and in sinus postoperatively.  He was kept on amiodarone perioperatively and has been restarted on Coumadin.  He has otherwise remained stable.  His incisions are all healing well.  He is ambulating in the halls with cardiac rehab phase 1 and is progressing well.  He has remained afebrile and his vital signs have been stable. He is tolerating a regular diet and is having normal bowel and bladder function.  His labs on the day of discharge show sodium 138, potassium 3.8, BUN 20, creatinine 0.96, PT of 15.4, INR 1.2, white count 12.1, hemoglobin 10.5, hematocrit 31.8, and platelets 102.  His most recent chest x-ray shows atelectasis in the bases bilaterally with small bilateral pleural effusions.  He has been seen and evaluated by Dr. Laneta Simmers and at this time is ready for discharge home on postop day #4.  DISCHARGE MEDICATIONS: 1. Amiodarone 200 mg b.i.d. 2. Potassium 20 mEq daily x5 days. 3. Lasix 40 mg daily x5 days. 4. Oxycodone IR 5-10 mg q.4-6 h. p.r.n. for pain. 5. Coumadin 2.5 mg daily or as directed. 6. Enteric-coated aspirin 81 mg daily. 7. Atenolol 25 mg daily. 8. Colchicine 0.6 mg b.i.d. p.r.n. 9. Simvastatin 80 mg daily. 10.Vitamin D 2000 units daily.  DISCHARGE  INSTRUCTIONS:  He is asked to refrain from driving, heavy lifting, or strenuous activity.  He may continue ambulating daily and using his incentive spirometer.  He may shower daily and clean his incisions with soap and water.  He will continue his same preoperative diet.  DISCHARGE FOLLOWUP:  He will need to make an appointment to see Dr. Katrinka Blazing in 2 weeks.  He will then follow up with Dr. Laneta Simmers in 3 weeks with a chest x-ray.  He will follow up with Dr. Michaelle Copas office on Thursday, May 09, 2010, for PT and INR and monitoring of his Coumadin. In the interim, if he experiences any problems or has questions, he is asked to contact our office immediately.     Coral Ceo, P.A.   ______________________________ Evelene Croon, M.D.    GC/MEDQ  D:  05/06/2010  T:  05/07/2010  Job:  [id number]  cc:  Lyn Records, M.D. [facility] office Theressa Millard, M.D.  Electronically Signed by Weldon Inches. on 05/17/2010 04:24:39 PM Electronically Signed by Evelene Croon M.D. on 06/18/2010 11:23:18 AM

## 2010-07-02 NOTE — H&P (Signed)
Shawn [name], HACKLER NO.:  [id number]   MEDICAL RECORD NO.:  [id number]          PATIENT TYPE:  OBV   LOCATION:  2013                         FACILITY:  [facility]   PHYSICIAN:  Shawn [name], M.D.   DATE OF BIRTH:  October 06, 1935   DATE OF ADMISSION:  11/16/2007  DATE OF DISCHARGE:                              HISTORY & PHYSICAL   CHIEF COMPLAINT:  Presyncope.   HISTORY OF PRESENT ILLNESS:  Shawn [name] is a 75 year old male patient  with known coronary artery disease.  He underwent bypass surgery in 1992  with the following graft:  LIMA to the OM, saphenous vein graft to the  LAD, saphenous vein graft to the right coronary artery.  In September  2008, he had a bare metal stent placed through the saphenous vein graft  to the LAD.  [place], [place], he started feeling dizzy  and faint.  He states he never passed out.  However, EMF states that  the man's friend said he passed out for 3-4 minutes.   The patient denies any chest pain, palpitations, PND, orthopnea,  dizziness, or post syncope.  He is supposed to see Dr. Katrinka [name] in 2 weeks  for his annual checkup.  He has felt fine during this entire year.  He  states he had a Cardiolite done several years ago and passed it.   PAST MEDICAL HISTORY:  Coronary artery disease as described above,  hyperlipidemia, osteoporosis, and long-term medication use.   SOCIAL HISTORY:  Married.  Denies any tobacco or illicit drug use.  He  drinks 3 glasses of wine per week.   FAMILY HISTORY:  Mother had brain tumor.  Father had a CVA and  hypertension.   ALLERGIES:  PENICILLIN.   MEDICATIONS:  1. Plavix 75 mg a day.  2. Simvastatin 80 mg a day.  3. Enteric-coated aspirin 325 mg a day.  4. Fosamax daily.   PHYSICAL EXAMINATION:  VITAL SIGNS:  Temperature 97.5, pulse 80,  respirations 18, blood pressure initially 88/50, but after fluid bolus  it went up to 116/65, and O2 saturations 97% on room air.  GENERAL:  He is  in no acute distress.  HEENT:  Grossly normal.  No carotids or subclavian bruits.  No JVD or  thyromegaly.  Sclerae clear.  Conjunctivae normal.  Nares without  drainage.  CHEST:  Clear to auscultation bilaterally.  No wheezing or rhonchi.  HEART:  Regular rate and rhythm.  No evidence of murmur.  ABDOMEN:  Good bowel sounds, nontender, and nondistended.  No masses.  No bruits.  LOWER EXTREMITIES:  No peripheral edema.  SKIN:  Warm and dry.  There is a well healed midline sternotomy scar.  NEUROLOGIC:  Cranial nerves II through XII grossly intact.  PSYCH:  He has normal mood and affect.   Chest x-ray and labs at this time are being drawn and are still pending.  His EKG shows normal sinus rhythm with J-point elevation in V2, V3, and  a ventricular conduction delay.   ASSESSMENT AND PLAN:  1. Presyncope.  2. Coronary artery disease.  3. Hyperlipidemia.  4. Hypertension.  5. Osteoporosis.  6. Long-term medication use.   We will place him on telemetry and monitor him for dysrhythmia.  Given  him IV fluid bolus.  It seems as if this presyncopal event was more  likely due to hypotensive episode.  Cycle cardiac enzymes and check  electrolytes.  The patient was seen and examined  by Shawn [name].      Shawn [name], P.A.      Shawn [name], M.D.  Electronically Signed    LB/MEDQ  D:  11/16/2007  T:  11/17/2007  Job:  [id number]   cc:   Shawn [name], M.D.  Shawn [name], M.D.

## 2010-07-02 NOTE — Cardiovascular Report (Signed)
NAMESORIN, FRIMPONG NO.:  [id number]   MEDICAL RECORD NO.:  [id number]          PATIENT TYPE:  INP   LOCATION:  2807                         FACILITY:  [facility]   PHYSICIAN:  Corky Crafts, MDDATE OF BIRTH:  08-20-1935   DATE OF PROCEDURE:  DATE OF DISCHARGE:                            CARDIAC CATHETERIZATION   PRIMARY CARDIOLOGIST:  Dr. Verdis Prime   PROCEDURE:  PCI of the SVG to the LAD.   OPERATOR:  Dr. Eldridge Dace   INDICATIONS:  Unstable angina   FINDINGS:  The diagnostic catheterization was performed by Dr. Anne Fu.  There is a 90% proximal lesion in the SVG to LAD graft.   PROCEDURE NARRATIVE:  Angiomax was used for anticoagulation.  A JR-4  guiding catheter was placed into the ostium of the SVG to LAD. The  filter wire would not cross because of lack of guide support.  The guide  was changed out to an AL-1 guide.  A Prowater wire was placed across the  lesion then a filter wire did successfully cross the lesion.  A 2.0 x 12  Maverick was used to predilate the lesion. It was inflated to 6  atmospheres for 13 seconds and then to 8 atmospheres for 14 seconds.  A  2.5 x 18 mini Vision stent was then placed across the lesion and  deployed at 10 atmospheres for 17 seconds.  The tightest portion of the  lesion was postdilated with a 2.5 x 15-mm Quantum Maverick balloon  inflated to 18 atmospheres for 22 seconds.  After the post dilation  there was sluggish flow noted in the graft.  A basket from the filter  wire was retrieved and subsequent angiography showed the flow improved  greatly.  There was no residual stenosis.  TIMI III flow  was the  resulting flow after the stent procedure.  When examined after the  procedure, significant debris was noted inside the filter basket.   IMPRESSION:  1. Successful SVG to LAD stent placement with a 2.5 x 18-mm mini      Vision stent which was postdilated to 0.7 mm in diameter.  2. Filter wire was used with  successful retrieval of debris  3. Bare metal stent was used because of upcoming hip surgery.   RECOMMENDATIONS:  The patient should continue on aspirin and Plavix for  at least 30 days and continue with  aggressive secondary prevention.  He  will be watched overnight and his sheath will be removed using manual  compression.  Assuming no complications he will be discharged tomorrow.      Corky Crafts, MD  Electronically Signed    JSV/MEDQ  D:  11/06/2006  T:  11/06/2006  Job:  [id number]

## 2010-07-02 NOTE — Cardiovascular Report (Signed)
Shawn [name], DIB NO.:  [id number]   MEDICAL RECORD NO.:  [id number]          PATIENT TYPE:  INP   LOCATION:  6524                         FACILITY:  [facility]   PHYSICIAN:  Lyn Records, M.D.   DATE OF BIRTH:  1935-04-09   DATE OF PROCEDURE:  DATE OF DISCHARGE:                            CARDIAC CATHETERIZATION   INDICATION FOR PROCEDURE:  Recurrent angina following recent saphenous  vein graft intervention, October 06, 2006.   PROCEDURES PERFORMED:  1. Diagnostic left heart cath.  2. Selective coronary angiography.  3. Bypass graft angiography.  4. Left ventriculography.   DESCRIPTION:  After informed consent, a 6-French sheath was inserted  into the right femoral artery using the modified Seldinger technique.  A  6-French A2 multipurpose catheter was then used for hemodynamic  recordings, selective left and right coronary angiography, and hand  injection left ventriculography.  This catheter was also used for  selective bypass graft angiography.  Angio-Seal was used postprocedure  to achieve hemostasis without complications.   RESULTS:  1. Hemodynamic data:      a.     Aortic pressure 120/66.      b.     Left ventricular pressure 120/5 mmHg.  2. Left ventriculography:  The left ventricle is a V-Tachogram.      Dyssynergy is noted due to ventricular ectopy.  Overall, LV size      appears normal and function appears normal.  3. Selective coronary angiography.      a.     Left main coronary:  Widely patent.      b.     Left anterior descending coronary:  LAD is totally occluded       after the first septal perforator and the small first diagonal.       The LAD is filled by saphenous vein graft, previously       demonstrated.      c.     Circumflex artery:  This vessel is large, trifurcates on the       left lateral wall, contains mid and proximal vessel stents.       Irregularities obstructing the vessel up to 30% are noted.  There       is no change  in the vessel appearance since the prior study.      d.     Right coronary:  The right coronary artery is totally       occluded distally.  There is severe segmental disease in the mid       vessel.  There are right to right and left to right collaterals       noted.  4. Bypass graft angiography.      a.     Saphenous vein graft to the right coronary:  Totally       occluded/atretic.      b.     Saphenous vein graft to the left anterior descending       coronary artery:  This graft is patent.  The stent area is patent.       The proximal part of this  graft appears smaller than the mid part       of the graft, which is 3 to 3.5 mm in diameter.  The distal graft       contains an eccentric 50% to 70% stenosis unchanged from the prior       study.  The distal anastomosis is clean.  The LAD is widely patent       in the mid vessel and beyond out to the apex.  5. Left internal mammary graft to the circumflex:  This graft has been      demonstrated on previous caths to be occluded.   CONCLUSIONS:  1. Recurrent angina after recent unstable anginal episode that      resulted in stent implantation in the proximal portion of the      saphenous vein graft to the LAD.  This is a bare metal stent, 2.5      mm in diameter, within a saphenous vein graft that also has      eccentric 50% to 70% distal stenosis.  The graft is patent and I      see no significant obstructive lesions and no change in appearance      since the prior intervention 2 weeks ago.  2. Patent native circumflex, totally occluded proximal LAD, totally      occluded distal right, unchanged from prior.   PLAN:  Continue Plavix beyond 1 month.  Use nitro for recurrent angina.  If continuing episodes of angina, we will need to intervene upon the  distal saphenous vein graft lesion and also consider intravascular  ultrasound to determine if there is adequate stent apposition  proximally.      Lyn Records, M.D.  Electronically  Signed     HWS/MEDQ  D:  11/19/2006  T:  11/19/2006  Job:  [id number]   cc:   Theressa Millard, M.D.

## 2010-07-02 NOTE — Cardiovascular Report (Signed)
Shawn [name], Shawn [name]             ACCOUNT NO.:  [id number]   MEDICAL RECORD NO.:  [id number]          PATIENT TYPE:  INP   LOCATION:  2807                         FACILITY:  [facility]   PHYSICIAN:  Jake Bathe, MD      DATE OF BIRTH:  03-Sep-1935   DATE OF PROCEDURE:  11/06/2006  DATE OF DISCHARGE:                            CARDIAC CATHETERIZATION   PROCEDURES:  1. Left heart catheterization.  2. Left ventriculogram.  3. Selective coronary angiography.  4. Selective bypass angiography.   PRIMARY OPERATOR:  Mark C. Anne Fu, M.D.   INDICATIONS:  Mr. Bentler is a 75 year old male with coronary artery  disease status post coronary artery bypass graft x3 in 1992, presenting  to the [facility] Emergency Department with unstable angina.  He was  taken urgently to the catheterization lab.   PROCEDURE:  The patient was consented.  Risks and benefits explained.  The patient was prepped in a sterile fashion.  Lidocaine 1% was used to  infiltrate the right groin for local anesthesia.  Versed 1 mg, 25 mcg of  fentanyl was utilized for conscious sedation.  Using the modified  Seldinger technique, the right femoral artery was cannulated with a 6-  French arterial sheath.  Under fluoroscopic guidance, the Judkins left  #4 catheter was used to selectively engage the left coronary system.  Multiple views with hand injection of Omnipaque were performed.  Under  fluoroscopic guidance, the catheter was then exchanged for a Judkins  right #4 catheter.  This was used to selectively engage the right  coronary artery.  Multiple views with hand injection were performed.  This catheter was also used to selectively engage the SVG vein graft to  PDA as well as the SVG vein graft to the LAD.  Multiple views with hand  injection were performed on these vessels.  The LIMA to circumflex was  not engaged due to prior knowledge of it being atretic.  The Judkins  right catheter was then exchanged for an angled  pigtail which was used  to cross the aortic valve into the left ventricle.  LV hemodynamics were  obtained.  In the RAO position utilizing power injection of 25 mL of  contrast at 20 mL/second, left ventriculography was performed.  Aortic  valve pullback was then performed.   Following this procedure, the findings were discussed with Dr. Eldridge Dace  who will then attempt a PCI of SVG to LAD proximal vein graft.   FINDINGS:  1. Left Main:  No significant coronary artery disease.  2. Left anterior descending artery:  No significant change from prior      cath.  It is 100% occluded just distal to the first septal      perforator.  3. Left circumflex artery:  The previously placed distal left      circumflex stents are widely patent.  The circumflex artery is      diffusely diseased in the proximal mid segment with up to 50%      stenosis which is no change from prior catheterization.  4. Right coronary artery:  Proximal segment of the right coronary  artery has 50 to 70% ulcerative plaque/stenosis, which has no      significant change from prior.  This vessel is diffusely diseased      throughout and then eventually is 100% occluded distally but fills      the right to right and left to right collaterals.  This is a      dominant vessel.   BYPASS GRAFT ANATOMY:  1. SVG to LAD:  There is a proximal 95% stenosis just after the aortic      anastomosis site.  This is new from prior catheterization.  There      is also residual up to 50% stenosis just prior to LAD touchdown at      anastomotic site.  This is unchanged.  2. SVG to PDA:  Small atretic artery.  Worse since prior      catheterization.  3. LIMA to left circumflex:  This artery was not visualized secondary      to prior knowledge of being atretic.   LEFT VENTRICULOGRAPHY:  Normal left ventricular ejection fraction  estimated at 55% with no significant wall motion abnormalities noted.  There was no MR.   HEMODYNAMICS:  Left  ventricle systolic pressure was 128 with a left  ventricular end diastolic pressure of 12.  Aortic pressure was 128/54  with a mean of 84.  There was no hemodynamic gradient across the aortic  valve.   IMPRESSION:  1. Severe native vessel coronary artery disease as described above.  2. New proximal saphenous vein graft to left anterior descending graft      lesion of 95%.  3. Patent circumflex artery stents.  4. Atretic left internal mammary artery to circumflex and atretic      saphenous vein graft to posterior descending artery.  5. Normal left ventricular ejection fraction of 55% with no regional      wall motion abnormalities.   Findings were discussed with family and Dr. Eldridge Dace who will plan on  performing percutaneous intervention to SVG to LAD.  Patient was kept on  the catheterization table and 3,000 units of heparin was given with  sheath in place.      Jake Bathe, MD  Electronically Signed     MCS/MEDQ  D:  11/06/2006  T:  11/06/2006  Job:  [id number]   cc:   Lyn Records, M.D.

## 2010-07-02 NOTE — H&P (Signed)
Shawn [name], SEVERS NO.:  [id number]   MEDICAL RECORD NO.:  [id number]          PATIENT TYPE:  INP   LOCATION:  1827                         FACILITY:  [facility]   PHYSICIAN:  Jake Bathe, MD      DATE OF BIRTH:  1935/06/25   DATE OF ADMISSION:  11/06/2006  DATE OF DISCHARGE:                              HISTORY & PHYSICAL   PRIMARY CARDIOLOGIST:  Lyn Records, M.D.   CHIEF COMPLAINT:  Chest pain.   HISTORY OF PRESENT ILLNESS:  Mr. Runnion is a 75 year old male known  well to Dr. Katrinka Blazing with known coronary artery disease, history of CABG,  SVG to PDA, LIMA to  circumflex and SVG to LAD (LIMA to circumflex is  atretic, SVG to PDA is also atretic and prior circumflex stents have  been placed) with last cath in January 2007, showing no significant  change from prior, who reports to the [facility] Emergency Department  with development of substernal chest pain intermittent over the past 2  to 3 days, described as a pressure 5 to 7 out of 10, which was  responsive to nitroglycerin.  He had similar pain in the past.  The pain  occurred also during the night.   ALLERGIES:  PENICILLIN.   MEDICATIONS:  1. Atenolol 25 mg once a day.  2. Aspirin 325 mg once a day.  3. Vytorin 10/40 once a day.  4. Fosamax weekly.   FAMILY HISTORY:  No early family history of coronary artery disease.   SOCIAL HISTORY:  He drinks occasional red wine.  No tobacco or illicit  drug use.   PAST MEDICAL HISTORY:  1. Coronary artery disease, status post bypass as described above.  2. Hyperlipidemia.  3. Prior ECG demonstrating J point elevation in V2, V3 with incomplete      right bundle branch block.   REVIEW OF SYSTEMS:  He denies any syncope, palpitations, edema,  orthopnea.  Some increase in fatigue.  No significant shortness of  breath noted.  Unless specified above, all 12 review of systems  negative.   PHYSICAL EXAMINATION:  VITAL SIGNS:  In the emergency department heart  rate 47, blood pressure 122/78, respirations 18, saturating 100% on 2  liters O2.  GENERAL:  Alert and oriented times 3 in no acute distress.  Pleasant,  here with his wife at bedside.  HEENT:  Eyes:  Well perfused conjunctivae.  Nonicteric sclerae.  NECK:  Supple.  No lymphadenopathy.  No thyromegaly, no carotid bruits.  CARDIOVASCULAR:  Bradycardic regular rhythm with no murmurs, rubs or  gallops appreciated, 2+ femoral pulse, right groin.  No bruits.  LUNGS: Clear to auscultation bilaterally.  Normal respiratory effort.  ABDOMEN:  Soft, nontender, normoactive bowel sounds.  No  hepatosplenomegaly.  EXTREMITIES:  No clubbing, cyanosis or edema.  SKIN:  Warm, dry and intact.  No lesions, no rashes.  NEUROLOGIC:  Nonfocal.  No tremors noted.   DATA:  ECG in the emergency department demonstrates sinus bradycardia  with J point elevation V3, V2 which is unchanged from prior ECG.  There  is a suggestion of  incomplete right bundle branch block.  QRS duration  is still less than 120 msec.  Labs are currently pending.  Chest x-ray  currently pending.   ASSESSMENT/PLAN:  A 75 year old male with coronary artery disease status  post bypass graft with hyperlipidemia, osteoporosis here with unstable  angina.  1. Unstable angina.  We will take urgently to the cath lab for further      to assess graft anatomy and native coronary anatomy.  Currently      hemodynamically stable, chest pain free following nitroglycerin.      Avoid beta blocker currently due to bradycardia.  We will bolus      heparin once in the cath lab if needed. Oxygen.  We will assess      left ventricular function in the cath lab. Give aspirin.  2. Hyperlipidemia.  On Vytorin as above.  Check fasting lipid profile.  3. Osteoporosis. On Fosamax weekly.      Jake Bathe, MD  Electronically Signed     MCS/MEDQ  D:  11/06/2006  T:  11/07/2006  Job:  [id number]   cc:   Lyn Records, M.D.

## 2010-07-02 NOTE — H&P (Signed)
NAMEPRASHANT, GLOSSER NO.:  [id number]   MEDICAL RECORD NO.:  [id number]          PATIENT TYPE:  INP   LOCATION:  6524                         FACILITY:  [facility]   PHYSICIAN:  Lyn Records, M.D.   DATE OF BIRTH:  [birth year]   DATE OF ADMISSION:  11/18/2006  DATE OF DISCHARGE:                              HISTORY & PHYSICAL   CHIEF COMPLAINT:  Chest pain.   HISTORY OF PRESENT ILLNESS:  Shawn [name] is a pleasant 75 year old  gentleman with known history of coronary artery disease.  He presents to  the clinic today as a work-in for chest pain.  Symptoms began around  midnight last evening.  He describes it as a tightness in the midsternal  area that would radiate up into the lower portion of the neck.  There  was no shortness of breath, diaphoresis, nausea or vomiting.  He took  one nitroglycerin and when that did not relieve it took another one then  his symptoms were relieved.  He has had no chest pain since, but does  feel fatigued and washed out.  He has had no dyspnea.   The patient had a cardiac catheterization on November 06, 2006 which  revealed 95% stenosis in the previous bypass SVG to LAD graft.  This  then led to PCI of the SVG to the LAD with a bare-metal stent.  The  patient had done well since discharge from the hospital after this  procedure until last evening.   PAST MEDICAL HISTORY:  1. Coronary artery disease.  2. Myocardial infarction, 1991.  3. CABG, 1992, LIMA to the OM, SVG to the LAD, SVG to the RCA.  4. New stent placement to the SVG to RCA previous graft, September 19.  5. Hyperlipidemia.  6. Osteoporosis.   PAST SURGICAL HISTORY:  Bilateral hip replacement.   REVIEW OF SYSTEMS:  No fever of chills.  HEENT:  No problems with vision  or hearing.  CHEST:  As above.  He has had no cough, dyspnea, wheezing,  PND or orthopnea.  Denies any pleuritic chest discomfort.  ABDOMEN:  No  nausea, vomiting or abdominal pain.  No black stools  or hematochezia.  GU:  Denies dysuria, urinary frequency, hematuria, low back pain.  EXTREMITIES:  Denies problems with prior cath site in the right groin.  No edema.  NEURO:  Denies any numbness, tingling or headache.   MEDICATIONS:  1. Atenolol 25 mg daily.  2. Aspirin 325 mg daily.  3. Vytorin 10/40 one daily.  4. Fosamax 75 mg weekly.  5. Plavix 75 mg daily.   ALLERGIES:  PENICILLIN.   FAMILY HISTORY:  Mom died after operation for brain tumor; father died  from CVA.  He had hypertension.   SOCIAL HISTORY:  Quit smoking in 1991.  One to two glasses of wine per  day.  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, now  retired.   OBJECTIVE DATA:  VITAL SIGNS:  His weight is 219.0, pulse is 74, blood  pressure is 140/72.  GENERAL:  Very pleasant, no acute distress.  HEENT:  Eyes:  PRLA.  EOMs intact.  Ear, nose and throat unremarkable.  NECK:  No JVD.  Supple without adenopathy.  No carotid bruits  auscultated.  The thyroid gland is not enlarged.  No masses palpated.  CHEST:  Bradycardia with normal S1, S2.  No murmur, S3, S4, rub, click  or gallop appreciated.  LUNGS:  Clear throughout.  Normal respiratory effort.  Normal excursion.  ABDOMEN:  Soft, nontender.  Normal bowel sounds.  There are no masses or  hepatosplenomegaly.  EXTREMITIES:  No clubbing, cyanosis or edema.  He has +2 distal pulses  in the upper and lower extremities bilaterally.  Previous right groin  cath site reveals no evidence of hematoma, edema.  It has healed nicely.  GU:  Deferred.  NEURO:  Cranial nerves II-XII intact.  Gait steady.  Speech appropriate.   EKG reveals sinus bradycardia, ventricular rate 49.  There are no ST  changes.  No change from prior.   IMPRESSION:  1. Coronary artery disease, status post recent bare-metal stent      placement to the SVG to LAD graft, November 06, 2006.  2. Coronary artery bypass grafting x3, 1992.  3. Hyperlipidemia.  4. Osteoporosis.    PLAN:  Will admit for cardiac catheterization to check patency of new  stent.  The patient has been on Plavix and aspirin since discharge from  the hospital with the stent.  Will use nitroglycerin for pain if needed  and start IV heparin.  Patient will be n.p.o. for cardiac  catheterization in the morning.      Tamera C. Lewis, N.P.      Lyn Records, M.D.  Electronically Signed    TCL/MEDQ  D:  11/18/2006  T:  11/18/2006  Job:  [id number]

## 2010-07-05 NOTE — Cardiovascular Report (Signed)
NAME:  Shawn [name], Shawn [name]                       ACCOUNT NO.:  [id number]   MEDICAL RECORD NO.:  [id number]                   PATIENT TYPE:  INP   LOCATION:  3712                                 FACILITY:  [facility]   PHYSICIAN:  Lesleigh Noe, M.D.            DATE OF BIRTH:  [birth year]   DATE OF PROCEDURE:  09/01/2002  DATE OF DISCHARGE:                              CARDIAC CATHETERIZATION   INDICATIONS FOR PROCEDURE:  Acute coronary syndrome.  New high-grade distal  circumflex lesion.  Borderline saphenous venous graft lesion to the LAD.   PROCEDURE PERFORMED:  1. Double wire PCI on distal circumflex bifurcation lesion with cutting     balloon angioplasty, followed by CYPHER drug-eluting stent deployment.   DESCRIPTION:  After informed consent the right femoral artery was cannulated  via the modified Seldinger technique with a #7 [place] sheath.  The #5 French  3.5 Voda guide catheter was used to obtain guiding shots.  A Duostat  hemostatic device was used and 2 BMW wires were used.  One wire was used to  protect the smaller branch of the bifurcation.  Another BMW wire was used to  protect the main channel of the bifurcation.  Cutting balloon angioplasty  with a 6-mm long 2.5 mm diameter cutting balloon was performed to 8  atmospheres.  Stenting was then performed with a 13-mm x 2.5 mm CYPHER drug-  eluting stent.  Two balloon inflations to a peak pressure of 14 atmospheres  were performed.  The wire int he side branch was then removed.  The side  branch remained patent.  There was TIMI grade 3 flow noted.  The case was  terminated.   The patient came to the lab on IV Integrilin.  He was given 4500 units of IV  heparin.  ACT prior to starting the procedure was documented greater than  300 seconds.  ACT post-procedure 185 seconds and an additional 1500 units of  heparin was administered.  The patient came to the Laboratory on Plavix 75  mg a day.   CONCLUSION:  Successful  percutaneous coronary intervention on distal  circumflex bifurcational lesion with reduction in 95% stenosis to 0%  following cutting balloon angioplasty and CYPHER drug-eluting stent  implantation.  There was no difficulty with side branch occasional.    PLAN:  Aspirin and Plavix.  Plavix should be given for at least 6 months.  Stress Cardiolite will be performed to rule out evidence of antral wall  ischemia, as the saphenous venous graft to the LAD contains a 50% distal  stenosis.                                                Lesleigh Noe, M.D.    HWS/MEDQ  D:  09/01/2002  T:  09/02/2002  Job:  [id number]  Theressa Millard, M.D.  [address] [place]  [place] [address]  Fax: [phone]   cc:   Theressa Millard, M.D.  [address] [place]  [place] [address]  Fax: [phone]

## 2010-07-05 NOTE — Discharge Summary (Signed)
NAME:  Shawn [name], Shawn [name]                       ACCOUNT NO.:  [id number]   MEDICAL RECORD NO.:  [id number]                   PATIENT TYPE:  INP   LOCATION:  6525                                 FACILITY:  [facility]   PHYSICIAN:  Lyn Records, M.D.                DATE OF BIRTH:  [birth year]   DATE OF ADMISSION:  08/30/2002  DATE OF DISCHARGE:  09/02/2002                                 DISCHARGE SUMMARY   PRIMARY CARE Shawn [name]:  Shawn [name], M.D.   PROCEDURES:  1. (August 31, 2002) Cypher stent distal circumflex at bifurcation.   DISCHARGE DIAGNOSES:  1. Unstable angina pectoris:  Peak CK 135, MB of 5.8, troponin I of 0.39.     a. (August 31, 2002) Cardiac catheterization by Dr. Verdis [name] revealing        both native and bypass graft occlusive disease.  Native vessels:  100%        LAD, 100% RCA, and new 98% distal CFX.  Bypass grafting:  100% SVG to        RCA, 100% LIMA to OM, 50% SVG to LAD.  Subsequent successful stent        distal native CFX at bifurcation with reduction of 95% stenosis to 0%        with TIMI-3 flow.  Cypher drug-eluting stent placed.  Plan outpatient        stress Cardiolite to assess if threatened anterior wall from LAD        territory stenosis.  2. Ongoing tobacco abuse:  Smoking cessation discussed.  3. Incomplete right bundle branch block.  4. History of dyslipidemia in the setting of known coronary artery disease,     on Lipitor.  5. History of bilateral hip replacement.   PLAN:  1. Patient is discharged home in stable and improved condition.  2. Discharge medications:     a. Plavix 75 mg 1 p.o. q.d. for 6 months.     b. Enteric-coated aspirin 325 mg p.o. q.d.     c. Nitroglycerin tablets 0.4 mg sublingual p.r.n. chest pain.     d. Lipitor 40 mg p.o. q.d.     e. Atenolol 25 mg p.o. q.d.  3. Activity:  As tolerated.  4. Diet:  Low salt, low fat.  5. Followup:  Patient to call for followup office visit to be seen by Dr.     Katrinka [name] in 7 to 10 days  post discharge [phone]).   HISTORY OF PRESENT ILLNESS:  Shawn [name] is a 75 year old gentleman; history  of prior cardiac disease with previous CABG in 1992 by Dr. Particia [name].  Also, ongoing tobacco use.  History of dyslipidemia.   On day of admission, patient developed post exercise (his usual swim and  some yard work) substernal chest pain, nonradiating, and no relief after  sublingual nitroglycerin (probably outdated).  Discomfort exacerbated with  reclining.  Patient presented to [facility]  Emergency Room where IV  nitroglycerin was started with subsequent resolution of chest pain.  He is  continued on IV nitrates, subq Lovenox, and aspirin with continuation of his  beta-blocker and Lipitor.  Cardiac enzymes were borderline elevated with  troponin I 0.36 and 0.39.  CK less than 136 with positive relative index as  high as 4.8.  Patient experienced __________ chest pain relieved with  increase of IV nitroglycerin.  On August 31 2002, patient was taken for  coronary angiography with the following results:  1. LV gram:  Overall normal LV function with EF 45-55%.  2. Coronary angiography:  Left main:  25%.  LAD:  100% after ST #1.  CFX:     Diffuse disease.  Prior PTCA site was patent.  New 95% stenosis distal to     bifurcation.  RCA:  100% mid.  3. Bypass angiography:  Saphenous vein graft to RCA 100%.  Saphenous vein     graft to LAD 50% distal.  4. LIMA to circumflex 100%.  Comment:  It was the impression of Dr. Katrinka [name]     that patient had 100% LAD, RCA, and new 98% distal CFX stenosis.  These     were of his native arteries.  Bypass graft, occlusive disease with 100%     saphenous vein graft to RCA, 100% LIMA to OM, and 50% saphenous vein     graft to LAD.  Mild decreased LV function.   Patient was placed IV Integrilin.  On September 01, 2002, he returned on cath lab  where he underwent deployment, by Dr. Verdis [name], successfully of Cypher  drug-eluting stent placed to native  distal CFX.  TIMI flow grade 3 post-  procedure.   Patient continued course of IV Integrilin.  Anticipated he will complete a 6-  month course of Plavix with planned outpatient stress Cardiolite in the  future to evaluate if significant ischemia to LAD territory.   LABORATORY DATA:  WBC 9.6, hemoglobin 15.5, hematocrit 45.8, platelets 208.  Differential within normal range.  Admission coags were within normal range.  Admission sodium 140, potassium 4.4, chloride 106, glucose ranging from 92  to 168.  BUN 18, creatinine 1.3; 9 and 1 at time of discharge.  LFTs within  normal range.  Total protein slightly decreased at 5.5 and 5.7.  Albumin  slightly decreased at 3.2 and 3.1.  Cardiac enzymes:  First CK 135 MB  fraction 5.8 relative index 4.3, troponin I 0.36.  Second CK 129, MB 6.2,  relative index 4.8, troponin I 0.39.  Third CK 104, MB 4.1, relative index  3.9.  TSH within normal range at 1.552.  Point of care testing in the  emergency room revealed slightly elevated myoglobin at 210, 182, and 176  respectively (reference range 12 to 149).  Point of care CK-MB and troponin  I were normal.      Shawn [name], N.P.                       Lyn Records, M.D.    MES/MEDQ  D:  10/03/2002  T:  10/03/2002  Job:  [id number]   cc:   Shawn [name], M.D.  [address] [place]  [place] [address]  Fax: [phone]

## 2010-07-05 NOTE — Discharge Summary (Signed)
[name], [name] NO.:  [id number]   MEDICAL RECORD NO.:  [id number]          PATIENT TYPE:  INP   LOCATION:  3735                         FACILITY:  [facility]   PHYSICIAN:  Lyn Records, M.D.   DATE OF BIRTH:  1935/06/19   DATE OF ADMISSION:  02/17/2005  DATE OF DISCHARGE:  02/20/2005                                 DISCHARGE SUMMARY   ADMISSION DIAGNOSIS:  Recurring chest discomfort responsive to  nitroglycerin.   DISCHARGE DIAGNOSES:  1.  Coronary atherosclerotic heart disease.      1.  Prior history of coronary artery bypass graft in 1992.      2.  Distal circumflex stent insertion July of 2004 and repeat stenting          in November of 2005 because of restenosis.      3.  Recent reoccurring episodes of chest discomfort responsive to          nitroglycerin, felt to be probably secondary to distal right          coronary related ischemia.      4.  Cardiac catheterization demonstrated widely patent distal circumflex          without evidence of restenosis and severe native vessel disease as          outlined in the catheterization report and a stable 40-50% lesion in          the distal saphenous vein graft to the left anterior descending          artery.  2.  Hypercholesterolemia.   PROCEDURE:  1.  Coronary angiography, February 19, 2005.  2.  Serial blood work.   RECOMMENDATIONS:  1.  Continue same medical regimen as on admission to the hospital.  2.  Use nitroglycerin if recurrent episode of chest discomfort.  3.  Notify Dr. Katrinka Blazing if continued episodes of chest discomfort.  4.  Office appointment with Dr. Katrinka Blazing in two to four weeks or early if      recurring chest discomfort.  5.  Activity can be back to usual starting on February 21, 2005.   CONDITION ON DISCHARGE:  Improved.   HISTORY AND HOSPITAL COURSE:  The patient is 73 and has history of coronary  artery disease. He underwent coronary artery bypass grafting in 1992 after  multiple  recurrences of restenosis in the circumflex coronary artery. This  occurred prior to stenting. In 2004, the patient presented with chest  discomfort and was found to have an atretic LIMA graft to the circumflex,  totally occluded saphenous vein graft to the right coronary, and saphenous  vein graft to the LAD was patent. He underwent stenting in 2004. He had  recurrence of chest discomfort in 2005, some 12 months after his initial  circumflex stent and underwent restenting with the drug-eluting stent. He  presents now with two to three episodes of recurring chest discomfort over  the past week prior to admission, the most recent episode responsive to  sublingual nitroglycerin. Please see the history and admission for the  details upon admission.   The patient  was admitted to the hospital and had normal vital signs. Serial  enzymes and EKG's did not document evidence of myocardial infarction.   The patient had recently had a stress Cardiolite within the past two to  three months that did not demonstrate evidence of ischemia.   Diagnostic coronary angiography was performed on February 19, 2005 and  essentially showed no significant change from the angiogram performed on  November 2005. It did not vary from the appearance following stenting in the  circumflex. The stented region was widely patent. No significant change in  the other anatomy was noted.   The AngioSeal arteriotomy site did not allow bleeding. The patient ambulated  and it is anticipated he will be discharged this evening or early in the  a.m. of February 20, 2005.      Lyn Records, M.D.  Electronically Signed     HWS/MEDQ  D:  02/19/2005  T:  02/20/2005  Job:  [id number]   cc:   Theressa Millard, M.D.  Fax: [phone]

## 2010-07-05 NOTE — H&P (Signed)
NAMESAM, OVERBECK             ACCOUNT NO.:  [id number]   MEDICAL RECORD NO.:  [id number]          PATIENT TYPE:  INP   LOCATION:  1846                         FACILITY:  [facility]   PHYSICIAN:  Francisca December, M.D.  DATE OF BIRTH:  [birth year]   DATE OF ADMISSION:  12/25/2003  DATE OF DISCHARGE:                                HISTORY & PHYSICAL   REASON FOR ADMISSION:  Chest pain.   HISTORY OF PRESENT ILLNESS:  Shawn [name] is a pleasant 75 year old man who  developed this morning the spontaneous onset of substernal chest discomfort  which is difficult for him to describe, but he uses the words bad  indigestion. First it occurred more on the right and then on the left upper  chest. It then radiated to the neck and then to the right jaw. It did not  worsen when he went out walking, but did persist for several hours. A  sublingual nitroglycerin resolved the jaw and neck pain, but not the chest  discomfort. He finally presented to the emergency room about four hours into  the discomfort and it was relieved completely with nasal cannula O2. He has  not had chest discomfort since his stent was placed in July 2004. This was  in the native circumflex. The symptoms he had with that occurrence were  worse and not particularly similar to what he is describing today based on  that report. That discomfort was more severe across the upper chest,  radiating to the back of the neck and axilla bilaterally, and required IV  nitroglycerin for resolution.   PAST MEDICAL HISTORY:  1.  Coronary artery disease, S/P CABG in 1992.  2.  Dyslipidemia.  3.  Bilateral hip replacement.  4.  LCX stent implantation, drug eluting, July 2004.   CURRENT MEDICATIONS:  1.  Atenolol ? dose, he thinks 25 mg daily.  2.  Lipitor 40 mg p.o. daily.  3.  Aspirin 81 mg p.o. daily.  4.  Zetia 10 mg p.o. daily.   DRUG ALLERGIES:  None known.   SOCIAL HISTORY:  He is married and retired from the [employer]. He  smokes an occasional cigar. No ethanol.   REVIEW OF SYSTEMS:  Negative except as mentioned above.   PHYSICAL EXAMINATION:  VITAL SIGNS: Blood pressure 106/51, pulse 50 and  regular, respiratory rate 18, and temperature 98.2.  GENERAL: This is a well-appearing 75 year old man in no acute distress.  HEENT: Unremarkable. The head is normocephalic and atraumatic. The pupils  are equal, round, and reactive to light and accommodation. The extraocular  movements are intact. The oral mucosa is pink and moist. The tongue is not  coated.  NECK: Supple without thyromegaly or masses. Carotid upstrokes are normal.  There are no bruits or jugular venous distention.  CHEST: Clear with adequate excursion.  HEART: Regular rate and rhythm with normal S1 and S2. No S3, S4, murmur,  click, or rub noted.  ABDOMEN: Soft, flat, and nontender with no hepatosplenomegaly or midline  pulsatile mass. Bowel sounds present in all quadrants.  GENITALIA: The external genitalia reveal normal male phallus, descended  testicles, no lesions.  RECTAL EXAM: Not performed.  EXTREMITIES: Full range of motion with no edema. Intact distal pulses.  NEUROLOGIC: Cranial nerves II-XII intact. Motor and sensory grossly intact.  Gait not tested.  SKIN: Warm, dry, and clear.   ACCESSORY CLINICAL DATA:  The admission hemogram is pending. Serum  electrolytes, BUN, creatinine, and glucose are within normal limits. Point  of care cardiac enzymes, myoglobin 93.6, CK-MB 2.1, troponin less than 0.05.   Electrocardiogram reveals sinus bradycardia, incomplete right bundle branch  block.   I have not reviewed the chest x-ray, but by report is unremarkable.   IMPRESSION:  1.  Chest discomfort. This is unstable angina until proven otherwise.  2.  Remote history of coronary artery bypass graft.  3.  Status post LCX stent implantation in July 2004.  4.  Hyperlipidemia.   PLAN:  1.  The patient is admitted for telemetry monitoring, nasal  cannula O2, and      a saline lock will be placed.  2.  Obtain serial CK-MB and troponin enzymes. Repeat ECG in the a.m.  3.  Nitroglycerin p.r.n. Continue home medications.  4.  Subcutaneous Lovenox, pharmacy to dose. Aspirin 325 mg p.o. daily.  5.  Further evaluation per Dr. Lyn Records. Of note, he did have a      normal exercise Cardiolite in the office in September 2005.       JHE/MEDQ  D:  12/25/2003  T:  12/25/2003  Job:  [id number]   cc:   Lesleigh Noe, M.D.  [address]  [place]  [place] [address]  Fax: [phone]   Theressa Millard, M.D.  [address] [place]  [place] [address]  Fax: [phone]

## 2010-07-05 NOTE — Cardiovascular Report (Signed)
NAME:  Shawn [name], Shawn [name]                       ACCOUNT NO.:  [id number]   MEDICAL RECORD NO.:  [id number]                   PATIENT TYPE:  INP   LOCATION:  3712                                 FACILITY:  [facility]   PHYSICIAN:  Lesleigh Noe, M.D.            DATE OF BIRTH:  02-Oct-1935   DATE OF PROCEDURE:  08/31/2002  DATE OF DISCHARGE:                              CARDIAC CATHETERIZATION   INDICATIONS FOR PROCEDURE:  Acute coronary syndrome.   PROCEDURE PERFORMED:  1. Left heart catheterization.  2. Selective coronary angiography.  3. Left ventriculography.  4. Bypass graft angiography.  5. Internal mammographed angiography.   DESCRIPTION:  After informed consent a #6 French sheath was placed in the  right femoral artery using the modified Seldinger technique.  A #6 [place] AT  multipurpose catheter was used for hemodynamic recordings, ventriculography  by hand injection, and selective saphenous venous graft angiography.  A #6  [place]  internal mammary catheter was used for right native coronary  angiography and for left internal mammary angiography.  A #6 [place] #4 left  Judkins catheter was used for left coronary angiography.  The patient  tolerated the procedure without complications.   RESULTS:   I. HEMODYNAMIC DATA:  A.  Aortic pressure 129/73.  B.  Left ventricular pressure 133/8 mmHg.   II. LEFT VENTRICULOGRAPHY:  The left ventricle was normal in size and  demonstrated its normal contractility.  EF of 45-55%.   III. CORONARY ANGIOGRAPHY:  A.  Left main coronary:  The left main coronary  artery contains an eccentric 30% narrowing.  B.  Left anterior descending coronary:  The LAD is totally occluded after  the first septal perforator.  The septal perforators supply collaterals to  the PDA of the right coronary.  C.  Circumflex artery:  Circumflex coronary artery is a large vessel that  bifurcates on the left lateral wall.  The circumflex contains multiple  luminal  irregularities in the proximal and mid vessel and just beyond the  bifurcation in the main branch there is a new 99% stenosis that appears to  be somewhat thrombus containing.  It appears that the distal part of this  vessel also supplies collaterals to the distal right coronary.  Surprisingly, this is a vessel that required coronary artery bypass grafting  in 1992, but is now widely patent, and the LIMA to this vessel is occluded.  D.  Right coronary:  The right coronary artery is totally occluded in the  mid vessel.  The conus branch and acute marginal branches provided  collaterals to the PDA.  The PDA appears to have competitive flow.   IV. SAPHENOUS VENOUS GRAFT TO THE RIGHT CORONARY:  A.  Totally occluded.  B.  Saphenous venous graft to the LAD:  Distal 50% eccentric stenosis.  The  distal LAD contains luminal irregularities, but no high-grade obstruction.   V. LIMA TO THE OBTUSE MARGINAL:  Totally occluded  in the mid LIMA.   CONCLUSION:  1. Severe native vessel coronary disease with total occlusion of the mid     right, total occlusion of the mid LAD, and new distal 99% obtuse marginal     stenosis.  Previous angioplasty site in the proximal circumflex was     widely patent.  Despite this vessel needing relative emergent surgery in     1992.  2. Bypass graft occlusive disease with total occlusion of the graft to the     right coronary and occlusion of the LIMA to the obtuse marginal.  There     is 50% stenosis in the saphenous venous graft to the LAD.  3. Low-normal left ventricular function.   PLAN:  1. PCI on the distal circumflex.  2. Consider PCI on the distal saphenous venous graft if we can demonstrate     inducible ischemia by Cardiolite scintigraphy in several weeks.                                               Lesleigh Noe, M.D.    HWS/MEDQ  D:  08/31/2002  T:  09/01/2002  Job:  [id number]

## 2010-07-05 NOTE — H&P (Signed)
NAMEKOLLYN, LINGAFELTER NO.:  [id number]   MEDICAL RECORD NO.:  [id number]          PATIENT TYPE:  INP   LOCATION:  3735                         FACILITY:  [facility]   PHYSICIAN:  Lyn Records, M.D.   DATE OF BIRTH:  12-13-1935   DATE OF ADMISSION:  02/17/2005  DATE OF DISCHARGE:                                HISTORY & PHYSICAL   REASON FOR ADMISSION:  Recurring chest pain.   SUBJECTIVE:  Shawn [name] is a 75 year old retired gentleman who has an  extensive coronary disease history.  He is status post coronary artery  bypass grafting in the early 1990s.  He received a saphenous vein graft to  the LAD, saphenous vein graft to the right coronary and LIMA to the obtuse  marginal.  The obtuse marginal/circumflex had been a recurring symptom  producing vessel and was treated with PCI on 3 or 4 occasions but continued  to have restenosis, thus requiring surgery.  The LIMA was placed to the  vessel.  He represented in 2004 and was noted to have bypass graft failure  and native circumflex artery disease.  The LIMA to the circumflex is  atretic, the saphenous vein graft to the LAD is patent, saphenous vein graft  to the right coronary is occluded.  The native right coronary and LAD are  occluded and he has CYPHER stents in the mid to distal circumflex.  Restenosis in this region was treated last year with a repeat CYPHER stent  implant.   On this occasion, on each of the two days prior to admission, he had the  onset of chest pressure spontaneously without physical provocation.  Each  episode was relieved by sublingual nitroglycerin.  There was radiation of  the discomfort into the shoulder and into the neck.  In the emergency room,  there were no acute EKG changes and no ongoing symptoms.   FAMILY HISTORY:  Positive for coronary artery disease in a brother.   PAST MEDICAL HISTORY:  1.  Bilateral hip prostheses.  2.  History of coronary artery bypass grafting and  circumflex coronary      artery stents.  CABG in 1992, stents most recently November of 2005.  3.  Hypercholesterolemia.   ALLERGIES:  PENICILLIN.   MEDICATIONS:  1.  Vytorin 10/40 mg one per day.  2.  Atenolol 25 mg per day.  3.  Plavix 75 mg per day.  4.  Aspirin 81 mg per day.   REVIEW OF SYSTEMS:  Unremarkable.   HABITS:  Prior cigar smoker, states he does not do this anymore.  Has  occasional glass of wine.   OBJECTIVE:  On exam, blood pressure 142/70, heart rate 70, respirations 16  and nonlabored.  Skin was unremarkable, no cyanosis.  HEENT EXAM:  Unremarkable.  Extraocular movements were full, no carotid  bruits are heard.  LUNGS:  Clear to auscultation and percussion.  CARDIAC EXAM:  Reveals an S4 gallop.  ABDOMEN:  Soft, no abdominal bruits.  EXTREMITIES:  Reveal no edema, femoral pulses 2+, dorsalis pedis pulses 2+,  radial pulses 2+.  NEURO EXAM:  No  motor or sensory deficits noted.   Chest x-ray pending.  EKG:  Right bundle, no acute changes.   LABS:  Reveal two sets of cardiac markers that are negative.   ASSESSMENT:  Coronary atherosclerotic heart disease in a patient with known  bypass graft failure and native vessel disease.  He has undergone stent x2  in the native circumflex, to which there is a failed LIMA graft.  The second  stent procedure was due to focal restenosis in a previously placed stent a  year prior to that.  He is being admitted at this time to rule out  progression of native coronary disease, restenosis, or saphenous vein graft  failure to the LAD.   PLAN:  1.  Admit.  2.  Lovenox.  3.  Aspirin.  4.  Rule out MI with serial enzymes.  5.  Will need recath.      Lyn Records, M.D.  Electronically Signed     HWS/MEDQ  D:  02/17/2005  T:  02/17/2005  Job:  [id number]   cc:   Theressa Millard, M.D.  Fax: [phone]

## 2010-07-05 NOTE — Op Note (Signed)
Shawn [name], Shawn [name]             ACCOUNT NO.:  [id number]   MEDICAL RECORD NO.:  [id number]          PATIENT TYPE:  INP   LOCATION:  0001                         FACILITY:  [facility]   PHYSICIAN:  John L. Rendall, M.D.  DATE OF BIRTH:  Jun 25, 1935   DATE OF PROCEDURE:  02/23/2006  DATE OF DISCHARGE:                               OPERATIVE REPORT   PREOPERATIVE DIAGNOSIS:  Acetabular failure right total hip with large  cyst formation.   SURGICAL PROCEDURES:  Removal of acetabular polyethylene, extensive bone  grafting and reinsertion of new acetabular poly and hip ball.   POSTOPERATIVE DIAGNOSES:  1. Acetabular failure right total hip with large cyst formation.  2. Major ostial lysis behind acetabulum requiring 55 cc of volume.   SURGEON:  John L. Rendall, M.D.   ASSISTANT:  Duffy, PA-C.   ANESTHESIA:  General.   PATHOLOGY:  The patient has an approximate 39 to 73 year old right total  hip which has begun to ache.  He has radiographic evidence of wearing of  the acetabular polyethylene with thinning superiorly, but more  troublesome he has lysis of the posterior acetabular bone with a very  gigantic cyst there that is beginning to erode the medial wall of the  ileum.   PROCEDURE:  Under general anesthesia the patient is placed in the left  lateral decubitus position and the right hip is prepared with DuraPrep  and draped in a sterile field.  The previous posterior incision is  reopened and dissection is carried down to the IT band.  There the line  of previous sutures is identified and that suture line is reopened.  The  short Charnley retractor is inserted.  By taking down the short external  rotators and hip capsule from bone, the posterior femoral neck is then  exposed and Homan retractor is inserted.  Carefully dissection is done  around the posterior acetabulum.  The sciatic nerve is palpated over an  inch and half away from all this scar tissue and foreign body  reaction.  Once this area is debrided, the hip was dislocated.  The hip ball is  removed.  It is necessary to do an anterior capsular release from the  femur in order to move the large femoral trunnion out of the way.  With  this out of the way, the acetabulum was exposed and the worn  polyethylene was removed.  It appeared to be a Hylamer liner.  It had  worn about 50%, but then more troublesome was the foreign body reaction  behind the multihole acetabular component.  Once the polyethylene was  removed, multiple plugs were seen in the whole of synovial tissue.  These were cultured both aerobic and anaerobic, but did not appear  infectious.  This whole area was washed out.  By palpation the center of  the iliac bone behind the acetabulum was deficient at a level of an inch  and a half whereas most of the rest of it had bone behind it anywhere  from 1/2 inch to 3/4 inches away.  Great care was done to remove the  cyst liner from  behind the acetabulum pulsating saline to force up globs  of this tissue and fishing it out with the curette.  Once this was  completed, __________ bone graft AC chamber 45 cc was packed through the  holes into the ileum behind the acetabulum.  When still deficiency was  encountered, another 30 cc of large bone cancellous croutons were  obtained and those were forced behind.  Altogether approximately 55 cc  of bone graft were pushed behind the acetabulum.  Once this was  completed, a new cross linked polyethylene was inserted with a +11 36 mm  hip ball.  This was an upsizing from the previous 32 mm hip ball.  It  was taken through range of motion with the trial before adding the  permanent.  The hip ball was stable through full flexion, extension,  internal rotation to 45 degrees showed it to be stable, but beyond that  in flexion it would sublux posteriorly and dislocate.  The hip was  checked for leg length and felt to be equal to the other.  It was a +4  poly  so it lateralized the hip slightly.  At this point, what was left  of the posterior hip capsule was reconstructed with #1 Tycron.  Short  external rotators were reattached with #1 Tycron.  IT band closed with  the same and subcu in two layers of #1 and 2-0 Vicryl and skin with  clips.  It should be noted that over 1 hour was spent with both Arnoldo Morale, PA-C and myself cleaning out synovial debris and packing graft  behind the acetabulum.  This is a much larger defect than normal and  required considerably more time and effort than usual to get it right.  Once completed, however, it was felt to be appropriately what this  patient needed.  At the end of the case the patient returned to recovery  in good condition using a knee immobilizer on the leg.  Will allow  weightbearing as tolerated.  PA-C Arnoldo Morale was there and participated  in the entire case.      John L. Rendall, M.D.  Electronically Signed     JLR/MEDQ  D:  02/23/2006  T:  02/23/2006  Job:  [id number]

## 2010-07-05 NOTE — Op Note (Signed)
NAMEJEHU, MCCAUSLIN             ACCOUNT NO.:  [id number]   MEDICAL RECORD NO.:  [id number]          PATIENT TYPE:  AMB   LOCATION:  NESC                         FACILITY:  [facility]   PHYSICIAN:  Boston Service, M.D.DATE OF BIRTH:  08/28/1935   DATE OF PROCEDURE:  DATE OF DISCHARGE:                                 OPERATIVE REPORT   PREOPERATIVE DIAGNOSIS:  A 75 year old male, nontender nonnodular prostate,  PSA 0.27, distant history of papillary noninvasive TCCa,  last tumor was in  March 1995, cystoscopy at this point is on an annual basis.  Recent cysto  showed papillary tumor behind the right ureteral orifice.   POSTOPERATIVE DIAGNOSIS:  A 75 year old male, nontender nonnodular prostate,  PSA 0.27, distant history of papillary noninvasive TCCa,  last tumor was in  March 1995, cystoscopy at this point is on an annual basis.  Recent cysto  showed papillary tumor behind the right ureteral orifice.   PROCEDURE:  Cystoscopy, retrograde transurethral resection of bladder tumor,  TUR VaporTrode.   SURGEON:  Boston Service, M.D.   ASSISTANT:  None.   ANESTHESIA:  General.   SPECIMENS:  Bladder tumor.   DRAINS:  20-French Foley.   ESTIMATED BLOOD LOSS:  Minimal.   COMPLICATIONS:  None obvious.   DESCRIPTION OF PROCEDURE:  The patient was prepped and draped in the dorsal  lithotomy position after institution of an adequate level of general  anesthesia.  A well lubricated 21-French panendoscope was gently inserted at  the urethral meatus, normal urethra and sphincter, coapting lateral lobes of  the prostate.  Prostatic urethra was fairly short.  Clear efflux right and  left orifice, papillary tumor behind the right ureteral orifice.  No other  suspicious urothelium identified using the 12 and 70 degrees lenses.  Once  appropriate endoscopic photographs had been taken, retrogrades were  performed.   Blocking catheter was selected, positioned at the right ureteral  orifice  then at the left ureteral orifice.  Similar technique was used with gentle  injection of 4 to 8 mL of contrast.  Ureter was carefully outlined.  There  was physiologic dilation at it crossed the femoral vessels but no evidence  of filling defect or obstruction.  There was prompt drainage at 3 to 5  minutes on both sides.   Once retrograde films had been obtained, biopsy forceps and then the TUR  loop was used to excise papillary tumor.  Care was taken to include  underlying muscular tissue.  Once all suspicious urothelium and underlying  muscular tissue had been removed.  VaporTrode element was inserted.  Adequate hemostasis was obtained.  Bladder was filled with about 150 mL of  sterile water.  Resectoscope sheath was withdrawn.  20-French Foley inserted  easily into  the bladder.  15 mL in a 5 mL balloon.  The bladder was then patiently  irrigated with sterile water for period of about 20 minutes.  Catheter was  left to straight drain and the patient was returned to recovery in  satisfactory condition.           ______________________________  Boston Service, M.D.  RH/MEDQ  D:  11/25/2005  T:  11/26/2005  Job:  [id number]   cc:   Theressa Millard, M.D.  Fax: [phone]

## 2010-07-05 NOTE — Cardiovascular Report (Signed)
Shawn [name], MULLENS NO.:  [id number]   MEDICAL RECORD NO.:  [id number]          PATIENT TYPE:  INP   LOCATION:  3735                         FACILITY:  [facility]   PHYSICIAN:  Lyn Records, M.D.   DATE OF BIRTH:  Jan 10, 1936   DATE OF PROCEDURE:  02/19/2005  DATE OF DISCHARGE:                              CARDIAC CATHETERIZATION   INDICATIONS:  Recent episodes of chest discomfort responsive to sublingual  nitroglycerin in this patient with known prior coronary artery bypass  grafting, coronary stent restenosis and restenting one year ago. Study is  being done to rule out progression of native and/or bypass graft disease and  rule out stent restenosis.   PROCEDURES:  1.  Left heart.  2.  Selective coronary angiography.  3.  Left ventriculography.  4.  Saphenous vein graft angiography.  5.  Internal mammary artery angiography not performed.  6.  AngioSeal arteriotomy closure.   DESCRIPTION:  After informed consent, a 6-French sheath was placed in the  right femoral using modified Seldinger technique. A 6-French A2 [place]  scientific multipurpose catheter was used for hemodynamic recordings, left  ventriculography by hand injection, and selective saphenous vein graft  angiography. We used a Medtronic Judkins #4 left coronary diagnostic  catheter and a Judkins #4 right coronary diagnostic catheter for native  coronary angiography. Internal mammary artery angiography was performed. The  patient tolerated the procedure without complications. Angio-Seal  arteriotomy closure was performed with good hemostasis after demonstrating  appropriate anatomy.   RESULTS:  1.  Hemodynamic data:      1.  Aortic pressure 138/64.      2.  Left ventricular pressure 138/15.  2.  Left ventriculography: LV cavity size is normal. LV function appears      normal. EF is 60%. No MR.  3.  Coronary angiography:      1.  Left main coronary:  Widely patent.      2.  Left anterior  descending coronary: Totally occluded after septal          perforator #1.      3.  Circumflex coronary artery:  Circumflex coronary artery is diffusely          diseased in the proximal and mid-segment with irregularities up to          50% in multiple spots. No high-grade obstructions is noted.          Distally, at the site of previous stenting x2 it is widely patent.          No evidence of in-stent restenosis noted. The distal branches from          within the stented site and beyond the stent are widely patent.      4.  Right coronary:  Right coronary is severely and diffusely diseased.          There is 50-70% proximal segmental stenosis. A moderate-sized acute          marginal branch arises distal to this disease. The RCA beyond the          acute marginal  branch is severely and diffusely disease and          terminates on an even smaller acute marginal branch. The distal          right coronary fills late by left-to-right and right-to-right          collaterals.  4.  Bypass graft angiography.      1.  Saphenous vein graft to the PDA:  Totally occluded.      2.  Saphenous vein graft to the LAD: The saphenous vein graft to the LAD          contains a distal 40-60% stenosis unchanged from prior cath. This          graft is basically widely patent.  5.  Left internal mammary graft to the circumflex: Previously documented to      be atretic.   CONCLUSION:  1.  Widely patent distal circumflex at the site of previous stenting without      evidence of restenosis.  2.  A 50% sounds vein graft distal stenosis near the anastomosis site with      the left anterior descending artery, unchanged from prior cath.  3.  Total occlusion of the mid-left anterior descending artery and the      distal right coronary artery native vessels.  4.  Patent native circumflex with diffuse proximal disease but no high-grade      obstruction.  5.  Occluded left internal mammary artery to the circumflex.   6.  Normal left ventricular function.  7.  Successful AngioSeal.   PLAN:  Home later this evening or in a.m.      Lyn Records, M.D.  Electronically Signed     HWS/MEDQ  D:  02/19/2005  T:  02/19/2005  Job:  [id number]   cc:   Theressa Millard, M.D.  Fax: [phone]

## 2010-07-05 NOTE — H&P (Signed)
NAME:  Shawn [name], Shawn [name]                       ACCOUNT NO.:  [id number]   MEDICAL RECORD NO.:  [id number]                   PATIENT TYPE:  EMS   LOCATION:  MAJO                                 FACILITY:  [facility]   PHYSICIAN:  Lyn Records, M.D.                DATE OF BIRTH:  11-29-1935   DATE OF ADMISSION:  08/30/2002  DATE OF DISCHARGE:                                HISTORY & PHYSICAL   ADMISSION DIAGNOSES:  1. Unstable angina, rule out myocardial infarction.  2. Known coronary artery disease status post coronary artery bypass grafting     greater than 12 years ago.  3. Hyperlipidemia.   CHIEF COMPLAINT:  Chest pain x 2 hours.   HISTORY OF PRESENT ILLNESS:  Shawn [name] is a 75 year old married white  male, remote patient of Dr. Katrinka Blazing, followed by Dr. Earl Gala for his primary  care.  Shawn [name] had open heart surgery back in 1992 by Kennon Portela.  Apparently he saw Dr. Katrinka Blazing in the postoperative course but has not had any  followup recently as he has been stable from a cardiovascular standpoint.  Apparently Dr. Earl Gala has been running regular treadmills on a routine  basis.   The patient states he had been in his usual state of health until this  afternoon around 1 o'clock.  Earlier today, he went for his usual swim and  did some yard work without any problems.  After going into the house, he  developed sudden onset of  substernal pain in the center of his chest, rated  at about 6/10 without associated symptoms and no radiation.  Around 1:30,  after trying to rest and getting no relief, he took one sublingual  nitroglycerin without improvement (dated nitroglycerin).  Discomfort was  slightly worse with lying down.  He decided to come on to the emergency  room.   Upon arrival, he continued to have 6/10 chest discomfort and developed some  radiation to the base of the neck and axillae bilaterally.  IV nitroglycerin  was started in the emergency room, and he had complete  resolution of his  symptoms.  Prior to today, he has felt about his usual self.   ALLERGIES:  PENICILLIN.   MEDICATIONS:  1. Lipitor 40 mg a day.  2. Atenolol 25 mg a day.  3. Aspirin.   PAST MEDICAL HISTORY:  1. Coronary artery disease.  2. Status post CABG in 1992 by Dr. Kennon Portela. Last ultrasound years ago     per the patient.  Last stress test about three years ago with Dr.     Earl Gala.  3. Hyperlipidemia.  4. Bilateral hip replacement   Essentially denies diabetes, hypertension, or CVA.   FAMILY HISTORY:  Positive for coronary disease in a brother.   SOCIAL HISTORY:  The patient is married.  Father and great grandfather of  seven.  He is retired.  Exercises regularly including walking, golfing,  and  swimming six days a week.  Red wine.  One cigar a day.   REVIEW OF SYSTEMS:  Denies fevers, chills, cough.  Coffee gives him  heartburn, but if he belches, the discomfort is relieved.  No genitourinary  complaints or peptic ulcer disease.  No edema.  No presyncope or syncope.   PHYSICAL EXAMINATION:  VITAL SIGNS:  Temperature 98.3, blood pressure  125/76, pulse 70, respirations 18, SIO2 97% on room air.  GENERAL:  Alert and oriented x 3.  No acute distress.  HEENT:  Normocephalic and atraumatic.  PERRLA.  EOMI.  NECK:  Supple without bruits or masses.  LUNGS:  Clear to auscultation.  COR:  Regular rate and rhythm without murmur, gallop, or rub.  Sternal  incision is healed.  ABDOMEN:  Soft, nontender, nondistended.  Normal bowel sounds.  EXTREMITIES:  2+ pulses bilaterally, no bruits;  2+ distal pulses  bilaterally without edema.  NEUROLOGIC:  Nonfocal, mentation intact.   LABORATORY DATA:  EKG shows normal sinus rhythm, nonspecific ST abnormality  in V3 through V5 with Q wave inversion in aVL.  Right bundle branch block.  No significant change from EKG 12/30/1997.   Chest x-ray results pending.   MB 3.0, troponin I less than 0.05, and myoglobin 210 which is  elevated.  WBC  9.6, hemoglobin 15.5, and platelets 208.  INR 0.9.   IMPRESSION:  1. Unstable angina, rule out myocardial infarction.  2. Coronary artery disease status post coronary artery bypass grafting     greater than 12 years ago.  3. Hyperlipidemia.   PLAN:  Admit.  Rule out MI.  Vital signs, O2 saturation nasal cannula at 2  liters.  Beta blocker, aspirin, and proton pump inhibitor.  Will keep n.p.o.  after midnight in case Dr. Katrinka Blazing wants to proceed with catheterization or  Cardiolite with Dr. Katrinka Blazing.     Georgiann Cocker Jernejcic, P.A.                   Lyn Records, M.D.    TCJ/MEDQ  D:  08/30/2002  T:  08/30/2002  Job:  [id number]

## 2010-07-05 NOTE — Discharge Summary (Signed)
[name], [name]             ACCOUNT NO.:  [id number]   MEDICAL RECORD NO.:  [id number]          PATIENT TYPE:  INP   LOCATION:  1510                         FACILITY:  [facility]   PHYSICIAN:  John L. Rendall, M.D.  DATE OF BIRTH:  02-Dec-1935   DATE OF ADMISSION:  02/23/2006  DATE OF DISCHARGE:  02/26/2006                               DISCHARGE SUMMARY   ADMISSION DIAGNOSES:  1. 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  2. Coronary artery disease, status post coronary artery bypass graft.  3. Hypercholesterolemia.   DISCHARGE DIAGNOSES:  1. 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  2. Right leg peroneal versus sciatic weakness, improving.  3. Acute blood loss anemia secondary to surgery.  4. Constipation, now resolved.  5. Coronary artery disease with history of coronary artery bypass      grafting.  6. Hypercholesterolemia.   SURGICAL PROCEDURES:  On February 23, 2006, Shawn [name] underwent a  revision of his acetabular component of his right total hip arthroplasty  with bone grafting by Dr. Jonny Ruiz L.  Rendall, assisted by Arnoldo Morale, PA-  C.  He had a metal-on-metal femoral head placed, 36 mm +11 neck length  with a Duraloc dynamic locking ring and a Duraloc Marathon acetabular  liner, 10-degree angle, 36 mm inner diameter, 66 mm outer diameter.   COMPLICATIONS:  None.   CONSULTATIONS:  Case management consult and physical therapy consult  February 24, 2006.   HISTORY OF PRESENT ILLNESS:  This 75 year old white male patient  presented to Dr. Priscille Kluver with a history of a right hip replacement done  by him in 1992.  He has had recent pain in his right hip and x-rays that  showed osteolysis of the acetabular component.  CT scan had then  subsequently revealed a moderate amount of lucency behind the cup and  because of his pain and the cyst, he is being admitted for revision of  his right hip  replacement.   HOSPITAL COURSE:  Shawn [name] tolerated his surgical procedure well  without immediate postoperative complications.  He was transferred to [address].  On postop day #1 he was afebrile, vitals were stable.  Pain was  well controlled.  He was noted to have some weakness of that right foot  with dorsiflexion, inversion and eversion.  This was monitored.  Knee  immobilizer was discontinued in case it was due to the peroneal nerve  compression at the knee.  Vitals were stable.  Hemoglobin 11.8,  hematocrit 34.6.  Wound cultures were subsequently negative.  He was  started on therapy per protocol.   He continued to make good progress over the next several days.  He  remained basically afebrile, vitals stable.  Hemoglobin/hematocrit were  stable.  The weakness of his right foot improved every single day.  He  did have a little bit of paresthesias in that right foot but that also  seemed to be getting better.  He made good progress with therapy and on  January 10, he is ready for  discharge home.  His T-max at this time is  99.1, hemoglobin 11.1, hematocrit 32.3.  Strength of the right foot  dorsiflexion, inversion and eversion now is about 4 to 4+/5 versus  probably 3/5 the first day postop.  Tingling is noted right now just at  his ankle.  He is doing well with therapy and is felt to be ready for  discharge home and will be discharged home later today.   DISCHARGE INSTRUCTIONS:   DIET:  He can resume his regular prehospitalization diet.   MEDICATIONS:  He may resume his regular prehospitalization medications  except no aspirin while he is on Arixtra.  Home medications included:   1. Fosamax 70 mg p.o. every Monday.  2. Atenolol 25 mg p.o. q.a.m.  3. Vytorin 10/40 mg one tablet p.o. q.a.m.  4. Aspirin 325 mg one tablet p.o. q.a.m.   Additional medications at this time include:  1. Arixtra 2.5 mg subcutaneously at 8 p.m., last dose March 04, 2006.  2. Percocet 5/325  mg one to two tablets p.o. q.4h. p.r.n. for pain, 50      with no refill.  3. Robaxin 500 mg one to two tablets p.o. q.6h. p.r.n. for spasms, 40      with no refill.   ACTIVITY:  He can be out of bed weightbearing as tolerated on the right  leg with the use of a walker.  He is to have home health PT per [facility].  Please see the blue total hip discharge sheet for  further activity instructions.   WOUND CARE:  He may shower after no drainage from the wound for 2 days.  Please see the blue total hip discharge sheet for further wound care  instructions.   FOLLOW-UP:  He needs to follow up with Dr. Priscille Kluver in our office on  Tuesday, March 10, 2006.  He needs to call [phone] for that  appointment.   LABORATORY DATA:  Chest x-ray taken on February 19, 2006, showed chronic  postop changes.  No active disease.  X-ray taken of his right hip on  January 7 showed satisfactory position of the femoral and acetabular  prosthesis on the right with no acute bony abnormalities.   White counts have ranged from 9.1 on January 3 to 10.2 on January 10.  Hemoglobin/hematocrit ranged from 15.4 and 45.7 on January 3 to 11.1 and  32.3 on January 10.  Platelets have ranged from 182 on the 3rd to 119 on  the 9th to 133 on the 10th.   Glucose ranged from 121 on January 3 to 149 on January 9.  Calcium  ranged from 9.1 on the 3rd to 8.1 on the 8th.  Wound cultures so far  have shown no growth and no organisms.  All other laboratory studies are  within normal limits.      Shawn [name], P.A.      John L. Rendall, M.D.  Electronically Signed    KED/MEDQ  D:  02/26/2006  T:  02/26/2006  Job:  [id number]   cc:   Theressa Millard, M.D.  Fax: [phone]

## 2010-07-05 NOTE — Cardiovascular Report (Signed)
NAMEJAQUARIOUS, Shawn [name] NO.:  [id number]   MEDICAL RECORD NO.:  [id number]          PATIENT TYPE:  INP   LOCATION:  6526                         FACILITY:  [facility]   PHYSICIAN:  Lyn Records III, M.D.DATE OF BIRTH:  Sep 12, 1935   DATE OF PROCEDURE:  12/26/2003  DATE OF DISCHARGE:                              CARDIAC CATHETERIZATION   INDICATION:  Possible acute coronary syndrome in a patient who is status  post coronary bypass surgery, status post failed angioplasty due to  restenosis in the 1990s, status post recent stent implantation within the  past year and a half.   PROCEDURE PERFORMED:  1.  Left heart catheterization.  2.  Selective coronary angiography.  3.  Left ventriculography.  4.  Bypass graft angiography.  5.  Stent, distal native circumflex using a 2-wire technique and balloon of      jailed side branch.   DESCRIPTION:  After informed consent, a 6-French sheath was placed in the  right femoral artery using a modified Seldinger technique.  A 6-French A2  multipurpose catheter was used for hemodynamic recordings, left  ventriculography by hand injection and saphenous vein graft angiography.  A  #4 left Judkins and #4 right Judkins 6-French catheter was used for native  coronary angiography.  The patient tolerated the diagnostic procedure  without complications.  Investigation of the previously stented distal  circumflex revealed high-grade stenosis in a jailed  side branch as well a  70% to 80% eccentric stenosis in the proximal stent margin.   After reviewing the cineangiograms, it was felt that reintervention on this  area was necessary.  We changed to a 6-French 3.5 XP catheter, left  coronary, and gave 5500 units of IV heparin, started an Integrilin infusion  after a double bolus and performed percutaneous intervention.  We used 2 BMW  wires.  We were able to redilate the side branch with a 2.25-mm balloon; 3  balloon inflations were performed.   We then placed a 13-mm-long 3.0 stent  within the proximal two-thirds of the previously placed stent and extending  out into the slightly more proximal circumflex; 14 atmospheres were used.  A  good angiographic result was noted.  The side branch remained patent.   After the procedure, AngioSeal arteriotomy closure was performed with no  complications.   RESULTS:   I. HEMODYNAMIC DATA:  A.  Aortic pressure 134/63.  B.  Left ventricular pressure 114/15.   II. LEFT VENTRICULOGRAPHY:  The left ventricle is faintly opacified.  There  is inferior wall hypokinesis.  Overall EF is 50%.   III. CORONARY ANGIOGRAPHY:  A.  Left main coronary:  The left main contains  no significant obstruction.  B.  The LAD is totally occluded in the mid-vessel.  A large septal  perforator arises proximal to the occlusion of the LAD.  The mid and distal  LAD is supplied by saphenous vein graft.  C.  Circumflex artery:  The circumflex is large and bifurcates along the  inferolateral wall.  The proximal margin of the previously placed stent  contains an eccentric 70% to 80% stenosis.  The jailed side branch contains  an 80% narrowing.  Collaterals to the distal right coronary fill off of the  septal perforators and the distal circumflex.  D.  Right coronary:  The right coronary artery is free of any significant  proximal narrowing, but is totally occluded in the mid-vessel.  The acute  marginal branch supplies right-to-right collaterals.   IV. SAPHENOUS VEIN GRAFT ANGIOGRAPHY:  A.  Saphenous vein graft to the LAD  is widely patent.  The LAD native vessel of the distal anastomosis site  contains 50% narrowing.  B.  Saphenous vein graft to the right coronary.  This graft is atretic and  functionally occluded.   V. LEFT INTERNAL MAMMARY ARTERY TO THE CIRCUMFLEX:  LIMA to the circumflex  This graft was not reinjected.  This graft had been demonstrated to be  totally occluded.   VI. PERCUTANEOUS CORONARY  INTERVENTION:  The restenotic lesion within the  proximal margin of the previously placed stent was reduced from 80% to 0%  after restenting.  The jailed side branch was redilated and the 90% stenosis  was reduced to 80%.  TIMI grade 3 flow was noted.   CONCLUSIONS:  1.  Bypass graft failure with total occlusion of the saphenous vein graft to      the right coronary and atresia of the left internal mammary artery to      the distal circumflex.  2.  Widely patent saphenous vein graft to the left anterior descending.  3.  Restenosis of the distal CYPHER stent in the circumflex with an 80% in-      stent restenosis.  4.  Total occlusion of the native right coronary and native left anterior      descending.  5.  Low-normal left ventricular function.  6.  Successful stenting of the in-stent restenosis with a CYPHER 3.0 x 13.0-      mm-long stent, also with maintenance of patency in the side branch.   PLAN:  Integrilin x18 hours, Plavix daily for 12 months, hopeful discharge,  December 27, 2003.       HWS/MEDQ  D:  12/26/2003  T:  12/26/2003  Job:  [id number]

## 2010-11-18 LAB — CARDIAC PANEL(CRET KIN+CKTOT+MB+TROPI)
CK, MB: 3.1
Relative Index: 2.3
Troponin I: 0.01

## 2010-11-18 LAB — COMPREHENSIVE METABOLIC PANEL
Alkaline Phosphatase: 80
BUN: 7
CO2: 30
GFR calc non Af Amer: >60
Glucose, Bld: 144 — ABNORMAL HIGH
Potassium: 4.1
Total Bilirubin: 0.7
Total Protein: 5.9 — ABNORMAL LOW

## 2010-11-18 LAB — DIFFERENTIAL
Basophils Absolute: 0
Basophils Relative: 0
Eosinophils Absolute: 0.1
Monocytes Relative: 4
Neutro Abs: 14.2 — ABNORMAL HIGH
Neutrophils Relative %: 90 — ABNORMAL HIGH

## 2010-11-18 LAB — CBC
Hemoglobin: 14.6
MCHC: 33.1
MCV: 95.5
RDW: 14.2

## 2010-11-18 LAB — CK TOTAL AND CKMB (NOT AT ARMC): CK, MB: 4.2 — ABNORMAL HIGH

## 2010-11-18 LAB — D-DIMER, QUANTITATIVE: D-Dimer, Quant: 6.69 — ABNORMAL HIGH

## 2010-11-28 LAB — BASIC METABOLIC PANEL
CO2: 28
Calcium: 8.2 — ABNORMAL LOW
Creatinine, Ser: 0.97
GFR calc Af Amer: >60
GFR calc non Af Amer: >60
Glucose, Bld: 99
Sodium: 142

## 2010-11-28 LAB — CBC
HCT: 40.7
Hemoglobin: 13.5
Hemoglobin: 13.6
Hemoglobin: 12.9 — ABNORMAL LOW
MCHC: 33.4
MCHC: 33.5
MCHC: 33.4
MCHC: 33.5
MCV: 94
MCV: 93.9
Platelets: 181
RBC: 4.1 — ABNORMAL LOW
RDW: 13.5
RDW: 13.8
RDW: 13.6
RDW: 13.8
WBC: 7.6

## 2010-11-28 LAB — PROTIME-INR
INR: 1.1
INR: 1.1
INR: 1
Prothrombin Time: 13.9
Prothrombin Time: 13.1

## 2010-11-28 LAB — CARDIAC PANEL(CRET KIN+CKTOT+MB+TROPI)
CK, MB: 5.4 — ABNORMAL HIGH
CK, MB: 5.5 — ABNORMAL HIGH
Relative Index: 3.9 — ABNORMAL HIGH
Total CK: 105
Troponin I: 0.28 — ABNORMAL HIGH

## 2010-11-28 LAB — COMPREHENSIVE METABOLIC PANEL
Albumin: 3.4 — ABNORMAL LOW
Alkaline Phosphatase: 70
BUN: 15
BUN: 14
CO2: 26
Calcium: 9.4
Chloride: 104
Creatinine, Ser: 0.96
GFR calc non Af Amer: >60
Glucose, Bld: 135 — ABNORMAL HIGH
Glucose, Bld: 120 — ABNORMAL HIGH
Potassium: 3.7
Total Bilirubin: 0.7

## 2010-11-28 LAB — DIFFERENTIAL
Basophils Absolute: 0
Lymphocytes Relative: 18
Lymphs Abs: 1.4
Neutrophils Relative %: 71

## 2010-11-28 LAB — HEPARIN LEVEL (UNFRACTIONATED): Heparin Unfractionated: 0.11 — ABNORMAL LOW

## 2010-11-28 LAB — CK TOTAL AND CKMB (NOT AT ARMC): Total CK: 152

## 2010-11-28 LAB — TROPONIN I: Troponin I: 0.01

## 2011-02-25 DIAGNOSIS — I4892 Unspecified atrial flutter: Secondary | ICD-10-CM | POA: Diagnosis not present

## 2011-02-25 DIAGNOSIS — Z7901 Long term (current) use of anticoagulants: Secondary | ICD-10-CM | POA: Diagnosis not present

## 2011-03-24 DIAGNOSIS — D4959 Neoplasm of unspecified behavior of other genitourinary organ: Secondary | ICD-10-CM | POA: Diagnosis not present

## 2011-03-31 DIAGNOSIS — C679 Malignant neoplasm of bladder, unspecified: Secondary | ICD-10-CM | POA: Diagnosis not present

## 2011-03-31 DIAGNOSIS — N400 Enlarged prostate without lower urinary tract symptoms: Secondary | ICD-10-CM | POA: Diagnosis not present

## 2011-04-08 DIAGNOSIS — I4892 Unspecified atrial flutter: Secondary | ICD-10-CM | POA: Diagnosis not present

## 2011-04-08 DIAGNOSIS — Z7901 Long term (current) use of anticoagulants: Secondary | ICD-10-CM | POA: Diagnosis not present

## 2011-04-17 DIAGNOSIS — J069 Acute upper respiratory infection, unspecified: Secondary | ICD-10-CM | POA: Diagnosis not present

## 2011-04-28 DIAGNOSIS — I4892 Unspecified atrial flutter: Secondary | ICD-10-CM | POA: Diagnosis not present

## 2011-04-28 DIAGNOSIS — Z7901 Long term (current) use of anticoagulants: Secondary | ICD-10-CM | POA: Diagnosis not present

## 2011-05-01 DIAGNOSIS — C679 Malignant neoplasm of bladder, unspecified: Secondary | ICD-10-CM | POA: Diagnosis not present

## 2011-06-03 DIAGNOSIS — N529 Male erectile dysfunction, unspecified: Secondary | ICD-10-CM | POA: Diagnosis not present

## 2011-06-03 DIAGNOSIS — I10 Essential (primary) hypertension: Secondary | ICD-10-CM | POA: Diagnosis not present

## 2011-06-03 DIAGNOSIS — E291 Testicular hypofunction: Secondary | ICD-10-CM | POA: Diagnosis not present

## 2011-06-05 DIAGNOSIS — I10 Essential (primary) hypertension: Secondary | ICD-10-CM | POA: Diagnosis not present

## 2011-06-05 DIAGNOSIS — E785 Hyperlipidemia, unspecified: Secondary | ICD-10-CM | POA: Diagnosis not present

## 2011-06-05 DIAGNOSIS — I4891 Unspecified atrial fibrillation: Secondary | ICD-10-CM | POA: Diagnosis not present

## 2011-06-05 DIAGNOSIS — I2510 Atherosclerotic heart disease of native coronary artery without angina pectoris: Secondary | ICD-10-CM | POA: Diagnosis not present

## 2011-06-05 DIAGNOSIS — Z7901 Long term (current) use of anticoagulants: Secondary | ICD-10-CM | POA: Diagnosis not present

## 2011-06-09 DIAGNOSIS — I4892 Unspecified atrial flutter: Secondary | ICD-10-CM | POA: Diagnosis not present

## 2011-06-09 DIAGNOSIS — Z7901 Long term (current) use of anticoagulants: Secondary | ICD-10-CM | POA: Diagnosis not present

## 2011-06-24 DIAGNOSIS — E291 Testicular hypofunction: Secondary | ICD-10-CM | POA: Diagnosis not present

## 2011-06-30 DIAGNOSIS — M5412 Radiculopathy, cervical region: Secondary | ICD-10-CM | POA: Diagnosis not present

## 2011-06-30 DIAGNOSIS — I4892 Unspecified atrial flutter: Secondary | ICD-10-CM | POA: Diagnosis not present

## 2011-06-30 DIAGNOSIS — Z7901 Long term (current) use of anticoagulants: Secondary | ICD-10-CM | POA: Diagnosis not present

## 2011-06-30 DIAGNOSIS — M47812 Spondylosis without myelopathy or radiculopathy, cervical region: Secondary | ICD-10-CM | POA: Diagnosis not present

## 2011-07-15 DIAGNOSIS — E291 Testicular hypofunction: Secondary | ICD-10-CM | POA: Diagnosis not present

## 2011-07-15 DIAGNOSIS — Z7901 Long term (current) use of anticoagulants: Secondary | ICD-10-CM | POA: Diagnosis not present

## 2011-07-15 DIAGNOSIS — I4892 Unspecified atrial flutter: Secondary | ICD-10-CM | POA: Diagnosis not present

## 2011-07-29 DIAGNOSIS — Z7901 Long term (current) use of anticoagulants: Secondary | ICD-10-CM | POA: Diagnosis not present

## 2011-07-29 DIAGNOSIS — I4892 Unspecified atrial flutter: Secondary | ICD-10-CM | POA: Diagnosis not present

## 2011-08-14 DIAGNOSIS — I4891 Unspecified atrial fibrillation: Secondary | ICD-10-CM | POA: Diagnosis not present

## 2011-08-14 DIAGNOSIS — Z7901 Long term (current) use of anticoagulants: Secondary | ICD-10-CM | POA: Diagnosis not present

## 2011-08-19 DIAGNOSIS — Z7901 Long term (current) use of anticoagulants: Secondary | ICD-10-CM | POA: Diagnosis not present

## 2011-08-19 DIAGNOSIS — I4892 Unspecified atrial flutter: Secondary | ICD-10-CM | POA: Diagnosis not present

## 2011-09-02 DIAGNOSIS — Z7901 Long term (current) use of anticoagulants: Secondary | ICD-10-CM | POA: Diagnosis not present

## 2011-09-02 DIAGNOSIS — I4892 Unspecified atrial flutter: Secondary | ICD-10-CM | POA: Diagnosis not present

## 2011-09-30 DIAGNOSIS — Z7901 Long term (current) use of anticoagulants: Secondary | ICD-10-CM | POA: Diagnosis not present

## 2011-09-30 DIAGNOSIS — I4891 Unspecified atrial fibrillation: Secondary | ICD-10-CM | POA: Diagnosis not present

## 2011-10-14 DIAGNOSIS — I4891 Unspecified atrial fibrillation: Secondary | ICD-10-CM | POA: Diagnosis not present

## 2011-10-14 DIAGNOSIS — Z7901 Long term (current) use of anticoagulants: Secondary | ICD-10-CM | POA: Diagnosis not present

## 2011-10-15 DIAGNOSIS — H903 Sensorineural hearing loss, bilateral: Secondary | ICD-10-CM | POA: Diagnosis not present

## 2011-10-15 DIAGNOSIS — H905 Unspecified sensorineural hearing loss: Secondary | ICD-10-CM | POA: Diagnosis not present

## 2011-10-23 DIAGNOSIS — M25559 Pain in unspecified hip: Secondary | ICD-10-CM | POA: Diagnosis not present

## 2011-11-11 DIAGNOSIS — Z7901 Long term (current) use of anticoagulants: Secondary | ICD-10-CM | POA: Diagnosis not present

## 2011-11-11 DIAGNOSIS — I4892 Unspecified atrial flutter: Secondary | ICD-10-CM | POA: Diagnosis not present

## 2011-11-24 IMAGING — CR DG CHEST 1V PORT
1 series · 1 of 1 positions shown · non-contrast
Comparison: Chest x-ray of 04/30/2010

CLINICAL DATA: CABG redo

PORTABLE CHEST - 1 VIEW

[view not recorded]
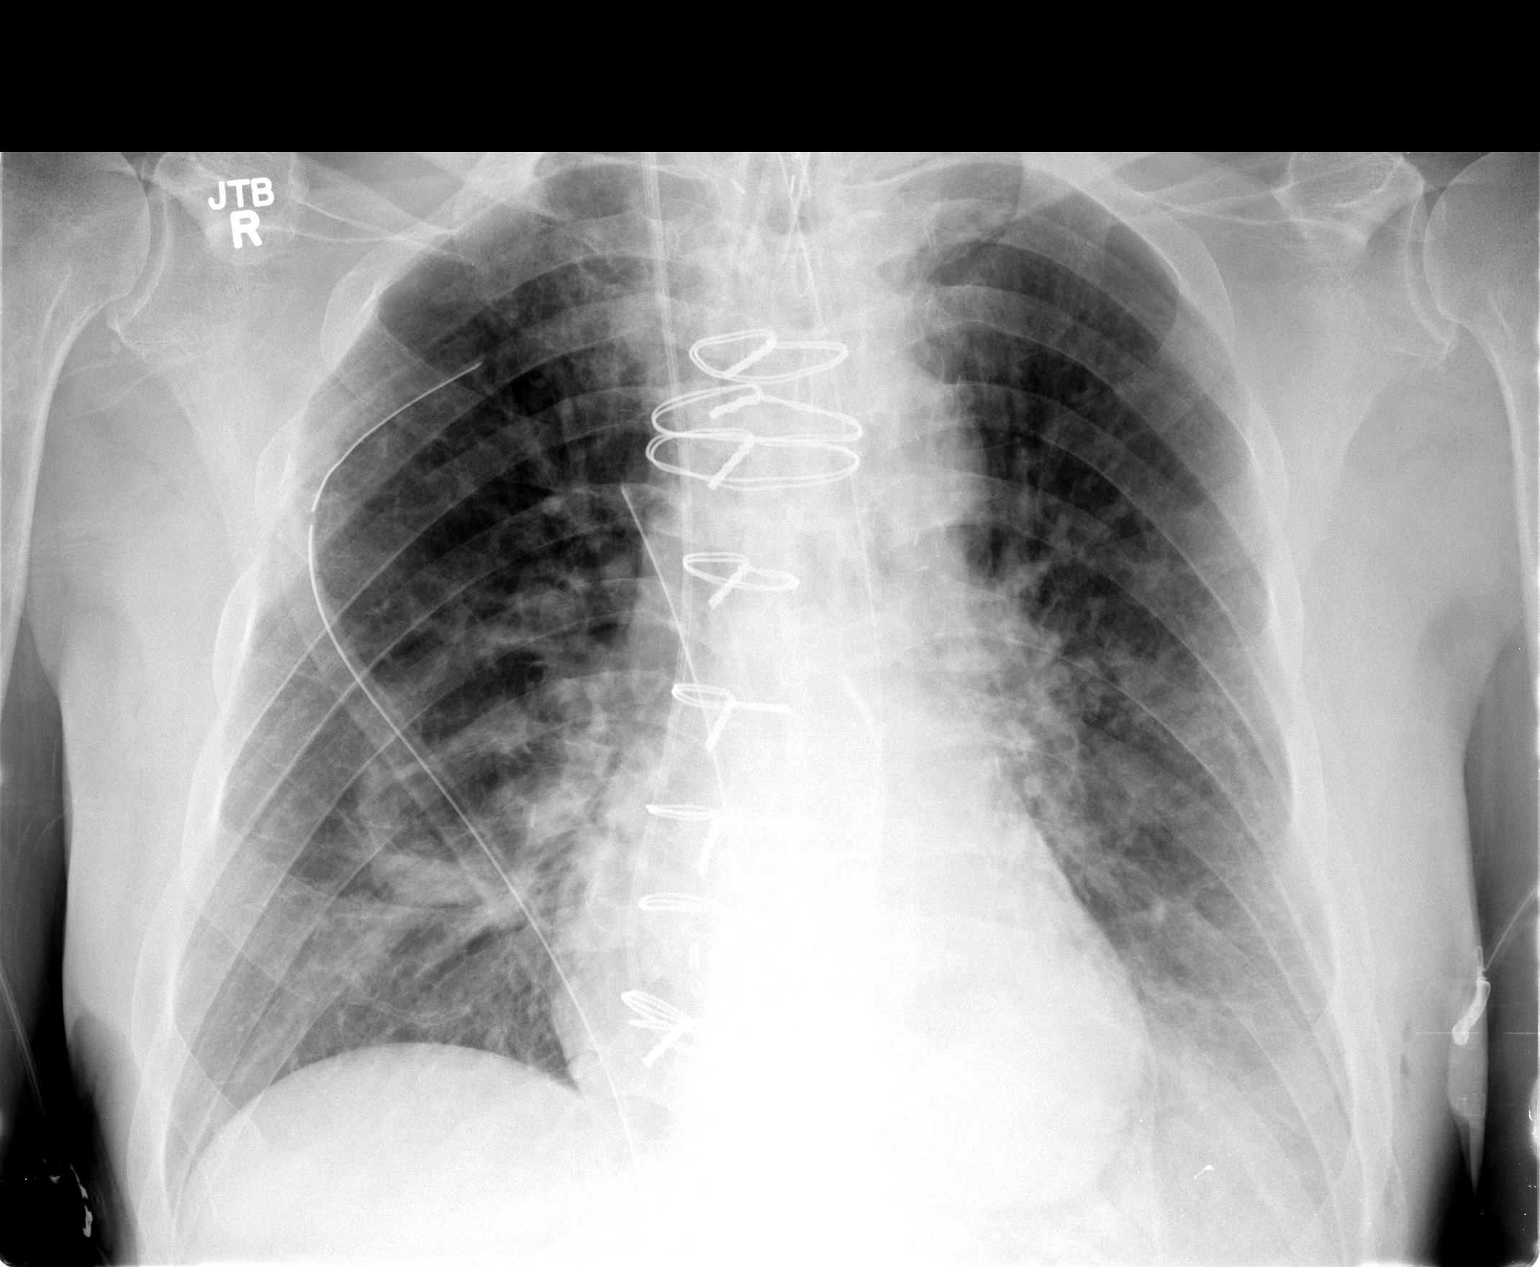

[1 of 1 positions shown; findings below may reference images not displayed]

FINDINGS: Endotracheal tube is in good position approximately
cm above the carina.  Swan-Ganz catheter is noted with the tip in
the left main pulmonary artery.  Right chest tube is present and no
pneumothorax is seen.  Mild basilar atelectasis and probable small
left effusion is present.  Heart size is stable.
IMPRESSION: 1.  Endotracheal tube tip approximately 8.2 cm above the carina.
2.  Right chest tube present.  No pneumothorax.
3.  Basilar atelectasis and probable small left effusion.

## 2011-12-01 DIAGNOSIS — Z23 Encounter for immunization: Secondary | ICD-10-CM | POA: Diagnosis not present

## 2011-12-09 DIAGNOSIS — Z7901 Long term (current) use of anticoagulants: Secondary | ICD-10-CM | POA: Diagnosis not present

## 2011-12-09 DIAGNOSIS — I4891 Unspecified atrial fibrillation: Secondary | ICD-10-CM | POA: Diagnosis not present

## 2011-12-22 DIAGNOSIS — R7301 Impaired fasting glucose: Secondary | ICD-10-CM | POA: Diagnosis not present

## 2011-12-22 DIAGNOSIS — E7800 Pure hypercholesterolemia, unspecified: Secondary | ICD-10-CM | POA: Diagnosis not present

## 2011-12-22 DIAGNOSIS — Z79899 Other long term (current) drug therapy: Secondary | ICD-10-CM | POA: Diagnosis not present

## 2011-12-22 DIAGNOSIS — I2510 Atherosclerotic heart disease of native coronary artery without angina pectoris: Secondary | ICD-10-CM | POA: Diagnosis not present

## 2011-12-22 DIAGNOSIS — Z0000 Encounter for general adult medical examination without abnormal findings: Secondary | ICD-10-CM | POA: Diagnosis not present

## 2011-12-22 DIAGNOSIS — I10 Essential (primary) hypertension: Secondary | ICD-10-CM | POA: Diagnosis not present

## 2011-12-22 DIAGNOSIS — Z1331 Encounter for screening for depression: Secondary | ICD-10-CM | POA: Diagnosis not present

## 2011-12-30 DIAGNOSIS — Z7901 Long term (current) use of anticoagulants: Secondary | ICD-10-CM | POA: Diagnosis not present

## 2011-12-30 DIAGNOSIS — I4892 Unspecified atrial flutter: Secondary | ICD-10-CM | POA: Diagnosis not present

## 2012-02-03 DIAGNOSIS — Z7901 Long term (current) use of anticoagulants: Secondary | ICD-10-CM | POA: Diagnosis not present

## 2012-02-03 DIAGNOSIS — I4891 Unspecified atrial fibrillation: Secondary | ICD-10-CM | POA: Diagnosis not present

## 2012-03-02 DIAGNOSIS — Z7901 Long term (current) use of anticoagulants: Secondary | ICD-10-CM | POA: Diagnosis not present

## 2012-03-02 DIAGNOSIS — I4892 Unspecified atrial flutter: Secondary | ICD-10-CM | POA: Diagnosis not present

## 2012-03-30 DIAGNOSIS — Z7901 Long term (current) use of anticoagulants: Secondary | ICD-10-CM | POA: Diagnosis not present

## 2012-03-30 DIAGNOSIS — I4892 Unspecified atrial flutter: Secondary | ICD-10-CM | POA: Diagnosis not present

## 2012-04-05 DIAGNOSIS — Z961 Presence of intraocular lens: Secondary | ICD-10-CM | POA: Diagnosis not present

## 2012-04-20 DIAGNOSIS — I4892 Unspecified atrial flutter: Secondary | ICD-10-CM | POA: Diagnosis not present

## 2012-04-20 DIAGNOSIS — Z7901 Long term (current) use of anticoagulants: Secondary | ICD-10-CM | POA: Diagnosis not present

## 2012-04-29 DIAGNOSIS — C679 Malignant neoplasm of bladder, unspecified: Secondary | ICD-10-CM | POA: Diagnosis not present

## 2012-04-29 DIAGNOSIS — Z125 Encounter for screening for malignant neoplasm of prostate: Secondary | ICD-10-CM | POA: Diagnosis not present

## 2012-06-01 DIAGNOSIS — I4892 Unspecified atrial flutter: Secondary | ICD-10-CM | POA: Diagnosis not present

## 2012-06-01 DIAGNOSIS — Z7901 Long term (current) use of anticoagulants: Secondary | ICD-10-CM | POA: Diagnosis not present

## 2012-06-21 DIAGNOSIS — I2510 Atherosclerotic heart disease of native coronary artery without angina pectoris: Secondary | ICD-10-CM | POA: Diagnosis not present

## 2012-06-21 DIAGNOSIS — I4891 Unspecified atrial fibrillation: Secondary | ICD-10-CM | POA: Diagnosis not present

## 2012-06-21 DIAGNOSIS — Z7901 Long term (current) use of anticoagulants: Secondary | ICD-10-CM | POA: Diagnosis not present

## 2012-06-21 DIAGNOSIS — I10 Essential (primary) hypertension: Secondary | ICD-10-CM | POA: Diagnosis not present

## 2012-06-21 DIAGNOSIS — E785 Hyperlipidemia, unspecified: Secondary | ICD-10-CM | POA: Diagnosis not present

## 2012-06-28 DIAGNOSIS — I10 Essential (primary) hypertension: Secondary | ICD-10-CM | POA: Diagnosis not present

## 2012-07-28 DIAGNOSIS — M1710 Unilateral primary osteoarthritis, unspecified knee: Secondary | ICD-10-CM | POA: Diagnosis not present

## 2012-07-28 DIAGNOSIS — IMO0002 Reserved for concepts with insufficient information to code with codable children: Secondary | ICD-10-CM | POA: Diagnosis not present

## 2012-08-02 DIAGNOSIS — I4891 Unspecified atrial fibrillation: Secondary | ICD-10-CM | POA: Diagnosis not present

## 2012-08-02 DIAGNOSIS — Z7901 Long term (current) use of anticoagulants: Secondary | ICD-10-CM | POA: Diagnosis not present

## 2012-08-30 DIAGNOSIS — Z7901 Long term (current) use of anticoagulants: Secondary | ICD-10-CM | POA: Diagnosis not present

## 2012-08-30 DIAGNOSIS — I10 Essential (primary) hypertension: Secondary | ICD-10-CM | POA: Diagnosis not present

## 2012-08-30 DIAGNOSIS — I2510 Atherosclerotic heart disease of native coronary artery without angina pectoris: Secondary | ICD-10-CM | POA: Diagnosis not present

## 2012-08-30 DIAGNOSIS — I4891 Unspecified atrial fibrillation: Secondary | ICD-10-CM | POA: Diagnosis not present

## 2012-09-21 DIAGNOSIS — M1710 Unilateral primary osteoarthritis, unspecified knee: Secondary | ICD-10-CM | POA: Diagnosis not present

## 2012-09-21 DIAGNOSIS — IMO0002 Reserved for concepts with insufficient information to code with codable children: Secondary | ICD-10-CM | POA: Diagnosis not present

## 2012-10-27 DIAGNOSIS — IMO0002 Reserved for concepts with insufficient information to code with codable children: Secondary | ICD-10-CM | POA: Diagnosis not present

## 2012-10-27 DIAGNOSIS — M1710 Unilateral primary osteoarthritis, unspecified knee: Secondary | ICD-10-CM | POA: Diagnosis not present

## 2012-11-04 DIAGNOSIS — IMO0002 Reserved for concepts with insufficient information to code with codable children: Secondary | ICD-10-CM | POA: Diagnosis not present

## 2012-11-04 DIAGNOSIS — M1710 Unilateral primary osteoarthritis, unspecified knee: Secondary | ICD-10-CM | POA: Diagnosis not present

## 2012-11-11 DIAGNOSIS — M1710 Unilateral primary osteoarthritis, unspecified knee: Secondary | ICD-10-CM | POA: Diagnosis not present

## 2012-11-11 DIAGNOSIS — IMO0002 Reserved for concepts with insufficient information to code with codable children: Secondary | ICD-10-CM | POA: Diagnosis not present

## 2012-12-13 DIAGNOSIS — Z23 Encounter for immunization: Secondary | ICD-10-CM | POA: Diagnosis not present

## 2012-12-28 DIAGNOSIS — M1710 Unilateral primary osteoarthritis, unspecified knee: Secondary | ICD-10-CM | POA: Diagnosis not present

## 2012-12-28 DIAGNOSIS — IMO0002 Reserved for concepts with insufficient information to code with codable children: Secondary | ICD-10-CM | POA: Diagnosis not present

## 2013-01-03 DIAGNOSIS — Z0000 Encounter for general adult medical examination without abnormal findings: Secondary | ICD-10-CM | POA: Diagnosis not present

## 2013-01-03 DIAGNOSIS — I10 Essential (primary) hypertension: Secondary | ICD-10-CM | POA: Diagnosis not present

## 2013-01-03 DIAGNOSIS — I4891 Unspecified atrial fibrillation: Secondary | ICD-10-CM | POA: Diagnosis not present

## 2013-01-03 DIAGNOSIS — I2510 Atherosclerotic heart disease of native coronary artery without angina pectoris: Secondary | ICD-10-CM | POA: Diagnosis not present

## 2013-01-03 DIAGNOSIS — Z1331 Encounter for screening for depression: Secondary | ICD-10-CM | POA: Diagnosis not present

## 2013-01-03 DIAGNOSIS — I252 Old myocardial infarction: Secondary | ICD-10-CM | POA: Diagnosis not present

## 2013-02-17 ENCOUNTER — Other Ambulatory Visit: Payer: Self-pay | Admitting: *Deleted

## 2013-02-17 DIAGNOSIS — I4891 Unspecified atrial fibrillation: Secondary | ICD-10-CM

## 2013-03-07 ENCOUNTER — Other Ambulatory Visit (INDEPENDENT_AMBULATORY_CARE_PROVIDER_SITE_OTHER): Payer: Medicare Other

## 2013-03-07 DIAGNOSIS — I4891 Unspecified atrial fibrillation: Secondary | ICD-10-CM

## 2013-03-07 LAB — RENAL FUNCTION PANEL
Albumin: 3.6 g/dL (ref 3.5–5.2)
BUN: 18 mg/dL (ref 6–23)
CHLORIDE: 104 meq/L (ref 96–112)
CO2: 31 mEq/L (ref 19–32)
CREATININE: 0.9 mg/dL (ref 0.4–1.5)
Calcium: 9.5 mg/dL (ref 8.4–10.5)
GFR: 83.1 mL/min (ref >60.00)
GLUCOSE: 139 mg/dL — AB (ref 70–99)
PHOSPHORUS: 3.5 mg/dL (ref 2.3–4.6)
Potassium: 4.1 mEq/L (ref 3.5–5.1)
Sodium: 140 mEq/L (ref 135–145)

## 2013-03-07 LAB — HEMOGLOBIN AND HEMATOCRIT, BLOOD
HCT: 42 % (ref 39.0–52.0)
Hemoglobin: 13.9 g/dL (ref 13.0–17.0)

## 2013-03-15 ENCOUNTER — Telehealth: Payer: Self-pay

## 2013-03-15 NOTE — Telephone Encounter (Signed)
lmom.Labs are stable and require no change in therapy

## 2013-03-15 NOTE — Telephone Encounter (Signed)
Message copied by Lamar Laundry on Tue Mar 15, 2013  3:03 PM ------      Message from: Daneen Schick      Created: Tue Mar 08, 2013  7:25 AM       Labs are stable and require no change in therapy ------

## 2013-05-01 ENCOUNTER — Other Ambulatory Visit: Payer: Self-pay | Admitting: Interventional Cardiology

## 2013-05-02 DIAGNOSIS — Z8551 Personal history of malignant neoplasm of bladder: Secondary | ICD-10-CM | POA: Diagnosis not present

## 2013-05-27 DIAGNOSIS — M545 Low back pain, unspecified: Secondary | ICD-10-CM | POA: Diagnosis not present

## 2013-06-27 ENCOUNTER — Ambulatory Visit (INDEPENDENT_AMBULATORY_CARE_PROVIDER_SITE_OTHER): Payer: Medicare Other | Admitting: Interventional Cardiology

## 2013-06-27 ENCOUNTER — Encounter: Payer: Self-pay | Admitting: Interventional Cardiology

## 2013-06-27 ENCOUNTER — Encounter (INDEPENDENT_AMBULATORY_CARE_PROVIDER_SITE_OTHER): Payer: Self-pay

## 2013-06-27 VITALS — BP 136/80 | HR 79 | Ht 76.0 in | Wt 200.0 lb

## 2013-06-27 DIAGNOSIS — I25810 Atherosclerosis of coronary artery bypass graft(s) without angina pectoris: Secondary | ICD-10-CM | POA: Insufficient documentation

## 2013-06-27 DIAGNOSIS — I2510 Atherosclerotic heart disease of native coronary artery without angina pectoris: Secondary | ICD-10-CM | POA: Diagnosis not present

## 2013-06-27 DIAGNOSIS — Z7901 Long term (current) use of anticoagulants: Secondary | ICD-10-CM

## 2013-06-27 DIAGNOSIS — I4891 Unspecified atrial fibrillation: Secondary | ICD-10-CM

## 2013-06-27 DIAGNOSIS — I10 Essential (primary) hypertension: Secondary | ICD-10-CM | POA: Insufficient documentation

## 2013-06-27 DIAGNOSIS — E785 Hyperlipidemia, unspecified: Secondary | ICD-10-CM

## 2013-06-27 NOTE — Progress Notes (Signed)
Patient ID: Shawn [name], male   DOB: [birth year], 78 y.o.   MRN: [id number]    [address], [address], [place]  [address] Phone: [phone] Fax:  [phone]  Date:  06/27/2013   ID:  Shawn [name], DOB 10-28-1935, MRN [id number]  PCP:  No primary provider on file.   ASSESSMENT:  1. Coronary atherosclerotic heart disease, stable 2. Paroxysmal atrial fibrillation and flutter, in normal sinus rhythm today and asymptomatic 3. Hypertension 4. Chronic anticoagulation,Apixaban  PLAN:  1. Continue anticoagulation therapy 2. Plan to followup in one year 3. Point angina, bleeding, dyspnea, or prolonged palpitations   SUBJECTIVE: Shawn [name] is a 78 y.o. male who denies angina, bleeding, and transient neurological complaints. He continues to play golf. He continues to smoke a cigar while playing golf. He denies orthopnea, PND, claudication, and transient neurological symptoms appear   Wt Readings from Last 3 Encounters:  06/27/13 200 lb (90.719 kg)     No past medical history on file.  Current Outpatient Prescriptions  Medication Sig Dispense Refill  . atenolol (TENORMIN) 25 MG tablet Take 1 tablet by mouth daily.      Marland Kitchen ELIQUIS 5 MG TABS tablet take 1 tablet by mouth twice a day  60 tablet  5  . simvastatin (ZOCOR) 80 MG tablet Take 1 tablet by mouth every morning.       No current facility-administered medications for this visit.    Allergies:    Allergies  Allergen Reactions  . Penicillins Rash    Social History:  The patient  reports that he has been smoking Cigars.  He started smoking about 40 years ago. He does not have any smokeless tobacco history on file. He reports that he does not drink alcohol or use illicit drugs.   ROS:  Please see the history of present illness.   Weight is been stable. Appetite is good. Denies chills and fever.   All other systems reviewed and negative.   OBJECTIVE: VS:  BP 136/80  Pulse 79  Ht 6\' 4"  (1.93 m)   Wt 200 lb (90.719 kg)  BMI 24.35 kg/m2 Well nourished, well developed, in no acute distress, tall, appears stated age HEENT: normal Neck: JVD flat. Carotid bruit absent  Cardiac:  normal S1, S2; RRR; no murmur Lungs:  clear to auscultation bilaterally, no wheezing, rhonchi or rales Abd: soft, nontender, no hepatomegaly Ext: Edema absent. Pulses 2+ Skin: warm and dry Neuro:  CNs 2-12 intact, no focal abnormalities noted  EKG:  Normal sinus rhythm, incomplete right bundle branch block, QS pattern V1 through V3       Signed, Illene Labrador III, MD 06/27/2013 10:05 AM

## 2013-06-27 NOTE — Patient Instructions (Signed)
Your physician recommends that you continue on your current medications as directed. Please refer to the Current Medication list given to you today.  Your physician recommends that you return for lab work in: 6 months (Hemoglobin, Creatinine)  Your physician wants you to follow-up in: 1 year with Dr.Smith You will receive a reminder letter in the mail two months in advance. If you don't receive a letter, please call our office to schedule the follow-up appointment.   

## 2013-07-05 DIAGNOSIS — I10 Essential (primary) hypertension: Secondary | ICD-10-CM | POA: Diagnosis not present

## 2013-07-14 DIAGNOSIS — M1710 Unilateral primary osteoarthritis, unspecified knee: Secondary | ICD-10-CM | POA: Diagnosis not present

## 2013-07-21 DIAGNOSIS — M1710 Unilateral primary osteoarthritis, unspecified knee: Secondary | ICD-10-CM | POA: Diagnosis not present

## 2013-07-28 DIAGNOSIS — M1710 Unilateral primary osteoarthritis, unspecified knee: Secondary | ICD-10-CM | POA: Diagnosis not present

## 2013-08-15 ENCOUNTER — Telehealth: Payer: Self-pay

## 2013-08-15 NOTE — Telephone Encounter (Signed)
lmom on machine for pt to call the office.pt sch to see DrSmith on 08/30/13. pt seen in 06/2013 pt does not need to see Dr.Smith for f/u until 06/2014.will cancel pt appt?

## 2013-08-30 ENCOUNTER — Ambulatory Visit: Payer: Medicare Other | Admitting: Interventional Cardiology

## 2013-09-26 DIAGNOSIS — Z961 Presence of intraocular lens: Secondary | ICD-10-CM | POA: Diagnosis not present

## 2013-11-04 ENCOUNTER — Other Ambulatory Visit: Payer: Self-pay | Admitting: *Deleted

## 2013-11-04 MED ORDER — APIXABAN 5 MG PO TABS: ORAL_TABLET | ORAL | Status: DC

## 2013-11-29 ENCOUNTER — Encounter (HOSPITAL_COMMUNITY): Payer: Self-pay | Admitting: Emergency Medicine

## 2013-11-29 ENCOUNTER — Emergency Department (HOSPITAL_COMMUNITY): Payer: Medicare Other

## 2013-11-29 ENCOUNTER — Inpatient Hospital Stay (HOSPITAL_COMMUNITY)
Admission: EM | Admit: 2013-11-29 | Discharge: 2013-12-01 | DRG: 287 | Disposition: A | Discharge status: Home / Self Care | Payer: Medicare Other | Attending: Interventional Cardiology | Admitting: Interventional Cardiology

## 2013-11-29 DIAGNOSIS — I480 Paroxysmal atrial fibrillation: Secondary | ICD-10-CM

## 2013-11-29 DIAGNOSIS — Z8551 Personal history of malignant neoplasm of bladder: Secondary | ICD-10-CM | POA: Diagnosis not present

## 2013-11-29 DIAGNOSIS — E785 Hyperlipidemia, unspecified: Secondary | ICD-10-CM | POA: Diagnosis present

## 2013-11-29 DIAGNOSIS — I2510 Atherosclerotic heart disease of native coronary artery without angina pectoris: Secondary | ICD-10-CM | POA: Diagnosis not present

## 2013-11-29 DIAGNOSIS — Z96643 Presence of artificial hip joint, bilateral: Secondary | ICD-10-CM | POA: Diagnosis present

## 2013-11-29 DIAGNOSIS — I25710 Atherosclerosis of autologous vein coronary artery bypass graft(s) with unstable angina pectoris: Secondary | ICD-10-CM | POA: Diagnosis present

## 2013-11-29 DIAGNOSIS — I252 Old myocardial infarction: Secondary | ICD-10-CM | POA: Diagnosis not present

## 2013-11-29 DIAGNOSIS — Z955 Presence of coronary angioplasty implant and graft: Secondary | ICD-10-CM | POA: Diagnosis not present

## 2013-11-29 DIAGNOSIS — I2582 Chronic total occlusion of coronary artery: Secondary | ICD-10-CM | POA: Diagnosis present

## 2013-11-29 DIAGNOSIS — I25118 Atherosclerotic heart disease of native coronary artery with other forms of angina pectoris: Secondary | ICD-10-CM

## 2013-11-29 DIAGNOSIS — J449 Chronic obstructive pulmonary disease, unspecified: Secondary | ICD-10-CM | POA: Diagnosis not present

## 2013-11-29 DIAGNOSIS — I4892 Unspecified atrial flutter: Secondary | ICD-10-CM | POA: Diagnosis present

## 2013-11-29 DIAGNOSIS — I10 Essential (primary) hypertension: Secondary | ICD-10-CM

## 2013-11-29 DIAGNOSIS — R0789 Other chest pain: Secondary | ICD-10-CM | POA: Diagnosis not present

## 2013-11-29 DIAGNOSIS — I25810 Atherosclerosis of coronary artery bypass graft(s) without angina pectoris: Secondary | ICD-10-CM | POA: Diagnosis present

## 2013-11-29 DIAGNOSIS — Z79899 Other long term (current) drug therapy: Secondary | ICD-10-CM | POA: Diagnosis not present

## 2013-11-29 DIAGNOSIS — R079 Chest pain, unspecified: Secondary | ICD-10-CM | POA: Diagnosis not present

## 2013-11-29 DIAGNOSIS — I200 Unstable angina: Secondary | ICD-10-CM | POA: Diagnosis not present

## 2013-11-29 DIAGNOSIS — Z7901 Long term (current) use of anticoagulants: Secondary | ICD-10-CM

## 2013-11-29 DIAGNOSIS — I255 Ischemic cardiomyopathy: Secondary | ICD-10-CM | POA: Diagnosis present

## 2013-11-29 DIAGNOSIS — F17290 Nicotine dependence, other tobacco product, uncomplicated: Secondary | ICD-10-CM | POA: Diagnosis present

## 2013-11-29 DIAGNOSIS — Z951 Presence of aortocoronary bypass graft: Secondary | ICD-10-CM | POA: Diagnosis not present

## 2013-11-29 DIAGNOSIS — I25110 Atherosclerotic heart disease of native coronary artery with unstable angina pectoris: Principal | ICD-10-CM | POA: Diagnosis present

## 2013-11-29 HISTORY — DX: ST elevation (STEMI) myocardial infarction involving other coronary artery of anterior wall: I21.09

## 2013-11-29 HISTORY — DX: Unspecified osteoarthritis, unspecified site: M19.90

## 2013-11-29 HISTORY — DX: Atherosclerotic heart disease of native coronary artery without angina pectoris: I25.10

## 2013-11-29 LAB — CBC
HEMATOCRIT: 41.3 % (ref 39.0–52.0)
Hemoglobin: 14 g/dL (ref 13.0–17.0)
MCH: 31.5 pg (ref 26.0–34.0)
MCHC: 33.9 g/dL (ref 30.0–36.0)
MCV: 92.8 fL (ref 78.0–100.0)
Platelets: 141 10*3/uL — ABNORMAL LOW (ref 150–400)
RBC: 4.45 MIL/uL (ref 4.22–5.81)
RDW: 14.4 % (ref 11.5–15.5)
WBC: 6.5 10*3/uL (ref 4.0–10.5)

## 2013-11-29 LAB — CBC WITH DIFFERENTIAL/PLATELET
BASOS ABS: 0 10*3/uL (ref 0.0–0.1)
Basophils Relative: 0 % (ref 0–1)
EOS ABS: 0.3 10*3/uL (ref 0.0–0.7)
EOS PCT: 5 % (ref 0–5)
HEMATOCRIT: 41.3 % (ref 39.0–52.0)
Hemoglobin: 13.6 g/dL (ref 13.0–17.0)
LYMPHS PCT: 17 % (ref 12–46)
Lymphs Abs: 0.8 10*3/uL (ref 0.7–4.0)
MCH: 30.8 pg (ref 26.0–34.0)
MCHC: 32.9 g/dL (ref 30.0–36.0)
MCV: 93.4 fL (ref 78.0–100.0)
MONO ABS: 0.4 10*3/uL (ref 0.1–1.0)
Monocytes Relative: 9 % (ref 3–12)
Neutro Abs: 3.3 10*3/uL (ref 1.7–7.7)
Neutrophils Relative %: 69 % (ref 43–77)
Platelets: 135 10*3/uL — ABNORMAL LOW (ref 150–400)
RBC: 4.42 MIL/uL (ref 4.22–5.81)
RDW: 14.5 % (ref 11.5–15.5)
WBC: 4.8 10*3/uL (ref 4.0–10.5)

## 2013-11-29 LAB — URINALYSIS, ROUTINE W REFLEX MICROSCOPIC
BILIRUBIN URINE: NEGATIVE
Glucose, UA: NEGATIVE mg/dL
HGB URINE DIPSTICK: NEGATIVE
Ketones, ur: NEGATIVE mg/dL
Leukocytes, UA: NEGATIVE
Nitrite: NEGATIVE
PH: 6 (ref 5.0–8.0)
Protein, ur: NEGATIVE mg/dL
SPECIFIC GRAVITY, URINE: 1.015 (ref 1.005–1.030)
Urobilinogen, UA: 0.2 mg/dL (ref 0.0–1.0)

## 2013-11-29 LAB — COMPREHENSIVE METABOLIC PANEL
ALT: 17 U/L (ref 0–53)
AST: 19 U/L (ref 0–37)
Albumin: 3.3 g/dL — ABNORMAL LOW (ref 3.5–5.2)
Alkaline Phosphatase: 103 U/L (ref 39–117)
Anion gap: 9 (ref 5–15)
BILIRUBIN TOTAL: 0.4 mg/dL (ref 0.3–1.2)
BUN: 17 mg/dL (ref 6–23)
CO2: 30 meq/L (ref 19–32)
CREATININE: 0.86 mg/dL (ref 0.50–1.35)
Calcium: 9.5 mg/dL (ref 8.4–10.5)
Chloride: 103 mEq/L (ref 96–112)
GFR calc Af Amer: >90 mL/min (ref >90)
GFR calc non Af Amer: 83 mL/min — ABNORMAL LOW (ref >90)
Glucose, Bld: 118 mg/dL — ABNORMAL HIGH (ref 70–99)
Potassium: 4.3 mEq/L (ref 3.7–5.3)
Sodium: 142 mEq/L (ref 137–147)
Total Protein: 6.6 g/dL (ref 6.0–8.3)

## 2013-11-29 LAB — TROPONIN I
Troponin I: <0.3 ng/mL (ref <0.30)
Troponin I: <0.3 ng/mL (ref <0.30)
Troponin I: <0.3 ng/mL (ref <0.30)

## 2013-11-29 LAB — TSH: TSH: 1.12 u[IU]/mL (ref 0.350–4.500)

## 2013-11-29 LAB — PROTIME-INR
INR: 1.16 (ref 0.00–1.49)
Prothrombin Time: 14.8 seconds (ref 11.6–15.2)

## 2013-11-29 LAB — PRO B NATRIURETIC PEPTIDE: Pro B Natriuretic peptide (BNP): 739.7 pg/mL — ABNORMAL HIGH (ref 0–450)

## 2013-11-29 LAB — APTT
aPTT: 39 seconds — ABNORMAL HIGH (ref 24–37)
aPTT: 75 seconds — ABNORMAL HIGH (ref 24–37)

## 2013-11-29 LAB — I-STAT TROPONIN, ED: Troponin i, poc: 0 ng/mL (ref 0.00–0.08)

## 2013-11-29 LAB — HEPARIN LEVEL (UNFRACTIONATED): Heparin Unfractionated: 1.9 IU/mL — ABNORMAL HIGH (ref 0.30–0.70)

## 2013-11-29 MED ORDER — INFLUENZA VAC SPLIT QUAD 0.5 ML IM SUSY
0.5000 mL | PREFILLED_SYRINGE | INTRAMUSCULAR | Status: AC
  Administered 2013-12-01: 0.5 mL via INTRAMUSCULAR
  Filled 2013-11-29: qty 0.5

## 2013-11-29 MED ORDER — ONDANSETRON HCL 4 MG/2ML IJ SOLN: 4.0000 mg | Freq: Four times a day (QID) | INTRAMUSCULAR | Status: DC | PRN

## 2013-11-29 MED ORDER — ATENOLOL 25 MG PO TABS
25.0000 mg | ORAL_TABLET | Freq: Every day | ORAL | Status: DC
  Administered 2013-11-29 – 2013-12-01 (×3): 25 mg via ORAL
  Filled 2013-11-29 (×3): qty 1

## 2013-11-29 MED ORDER — ASPIRIN EC 81 MG PO TBEC
81.0000 mg | DELAYED_RELEASE_TABLET | Freq: Every day | ORAL | Status: DC
  Administered 2013-11-30: 81 mg via ORAL
  Filled 2013-11-29: qty 1

## 2013-11-29 MED ORDER — ATORVASTATIN CALCIUM 80 MG PO TABS
80.0000 mg | ORAL_TABLET | Freq: Every day | ORAL | Status: DC
  Administered 2013-11-29 – 2013-11-30 (×2): 80 mg via ORAL
  Filled 2013-11-29 (×4): qty 1

## 2013-11-29 MED ORDER — NITROGLYCERIN 0.4 MG SL SUBL
0.4000 mg | SUBLINGUAL_TABLET | SUBLINGUAL | Status: DC | PRN
  Administered 2013-11-30 (×3): 0.4 mg via SUBLINGUAL
  Filled 2013-11-29 (×2): qty 1

## 2013-11-29 MED ORDER — HEPARIN (PORCINE) IN NACL 100-0.45 UNIT/ML-% IJ SOLN
1300.0000 [IU]/h | INTRAMUSCULAR | Status: DC
  Administered 2013-11-29 – 2013-11-30 (×2): 1300 [IU]/h via INTRAVENOUS
  Filled 2013-11-29 (×3): qty 250

## 2013-11-29 MED ORDER — ISOSORBIDE MONONITRATE ER 30 MG PO TB24
30.0000 mg | ORAL_TABLET | Freq: Every day | ORAL | Status: DC
  Administered 2013-11-29 – 2013-11-30 (×2): 30 mg via ORAL
  Filled 2013-11-29 (×3): qty 1

## 2013-11-29 MED ORDER — ACETAMINOPHEN 325 MG PO TABS
650.0000 mg | ORAL_TABLET | ORAL | Status: DC | PRN
Start: 2013-11-29 — End: 2013-11-30

## 2013-11-29 NOTE — H&P (Signed)
Primary cardiologist: Dr. Daneen Schick  Chief Complaint:   Shawn [name] is a 78 year old male with established coronary artery disease, who was admitted via the emergency room with recurrent episodes of chest pain which awakened him from sleep.  HPI:   Shawn [name] has documented coronary artery disease and in 1992 underwent initial CABG revascularization surgery by Dr. Redmond Pulling.  He socially has undergone stenting of his left circumflex vessel in 2004 1 2005 and 2800 bare-metal stent placement vein graft to the LAD.  In August 2011 he suffered a myocardial infarction and was treated at [facility] in [place] and had 2 additional stents placed in the vein graft to the LAD.  In March 2012 .  Repeat catheterization showed a widely patent.  Left main, total LAD occlusion after a large branching septal perforator, diffuse disease in the mid circumflex with moderate in-stent restenosis in the RCA was occluded at the mid vessel.  The vein to the LAD had multiple stents and there was 70% eccentric and 90% plus stenoses in the mid body of the graft.  He underwent redo CABG surgery x2 on 05/02/2002 by Dr. Cyndia Bent , with a right internal mammary artery to the LAD, and a saphenous vein graft to the circumflex marginal vessel via endoscopic vein harvesting from the left leg.  The patient also has a history of paroxysmal atrial fibrillation/flutter, which remotely.  He has been on Coumadin therapy, and more recently, Eliquis, he has a remote history of bladder cancer in 2007, a history of vertebral osteomyelitis and discitis.  He status post bilateral hip replacement and has a history of cataract surgery. He was last seen by Dr. Tamala Julian in May 2015 and was in normal sinus rhythm on Eliquis anticoagulation therapy. Last evening, at approximately 3 AM he was awakened from sleep with chest and neck discomfort, which was of similar character as his previous heart pain.  EMS arrived.  He had taken 2 nitroglycerin  and they also gave him aspirin.  There was some question of whether or not.  This was dyspepsia versus ischemia.  At that time he decided not to come to the hospital in was planning to be evaluated in the office, but one hour later, again developed recurrent symptomatology.  He presented to the emergency room for evaluation.  Cardiology evaluation is requested for possible admission.   PMHx:   This history is notable for paroxysmal atrial fibrillation, hypertension, cataract disease, bladder cancer, vertebral osteomyelitis, and bilateral hip replacement surgery per  Past Surgical History  Procedure Laterality Date  . Cardiac surgery      FAMHx:  Family History  Problem Relation Age of Onset  . Heart disease Father     SOCHx:   reports that he has been smoking Cigars.  He started smoking about 40 years ago. He does not have any smokeless tobacco history on file. He reports that he does not drink alcohol or use illicit drugs.  ALLERGIES:  Allergies  Allergen Reactions  . Penicillins Rash    ROS: Comprehensive 14 point system review was done and this is negative with the exception of recent chest pain.  Arthritis of his knees bilaterally, history of cataracts.  He denies bleeding, he denies edema.  He denies PND, orthopnea.  He is unaware of recent tachycardia palpitations.  He denies bleeding.  He denies recent change in vision.  He denies nausea, vomiting, or diarrhea.  He denies neurologic symptoms  HOME MEDS: No current facility-administered medications for this encounter.  Current outpatient prescriptions:apixaban (ELIQUIS) 5 MG TABS tablet, take 1 tablet by mouth twice a day, Disp: 60 tablet, Rfl: 5;  atenolol (TENORMIN) 25 MG tablet, Take 1 tablet by mouth daily., Disp: , Rfl: ;  simvastatin (ZOCOR) 80 MG tablet, Take 1 tablet by mouth every morning., Disp: , Rfl:   LABS/IMAGING: Results for orders placed during the hospital encounter of 11/29/13 (from the past 48 hour(s))    APTT     Status: Abnormal   Collection Time    11/29/13  6:00 AM      Result Value Ref Range   aPTT 39 (*) 24 - 37 seconds   Comment:            IF BASELINE aPTT IS ELEVATED,     SUGGEST PATIENT RISK ASSESSMENT     BE USED TO DETERMINE APPROPRIATE     ANTICOAGULANT THERAPY.  CBC     Status: Abnormal   Collection Time    11/29/13  6:00 AM      Result Value Ref Range   WBC 6.5  4.0 - 10.5 K/uL   RBC 4.45  4.22 - 5.81 MIL/uL   Hemoglobin 14.0  13.0 - 17.0 g/dL   HCT 41.3  39.0 - 52.0 %   MCV 92.8  78.0 - 100.0 fL   MCH 31.5  26.0 - 34.0 pg   MCHC 33.9  30.0 - 36.0 g/dL   RDW 14.4  11.5 - 15.5 %   Platelets 141 (*) 150 - 400 K/uL  PRO B NATRIURETIC PEPTIDE     Status: Abnormal   Collection Time    11/29/13  6:00 AM      Result Value Ref Range   Pro B Natriuretic peptide (BNP) 739.7 (*) 0 - 450 pg/mL  I-STAT TROPOININ, ED     Status: None   Collection Time    11/29/13  6:07 AM      Result Value Ref Range   Troponin i, poc 0.00  0.00 - 0.08 ng/mL   Comment 3            Comment: Due to the release kinetics of cTnI,     a negative result within the first hours     of the onset of symptoms does not rule out     myocardial infarction with certainty.     If myocardial infarction is still suspected,     repeat the test at appropriate intervals.  URINALYSIS, ROUTINE W REFLEX MICROSCOPIC     Status: None   Collection Time    11/29/13  6:22 AM      Result Value Ref Range   Color, Urine YELLOW  YELLOW   APPearance CLEAR  CLEAR   Specific Gravity, Urine 1.015  1.005 - 1.030   pH 6.0  5.0 - 8.0   Glucose, UA NEGATIVE  NEGATIVE mg/dL   Hgb urine dipstick NEGATIVE  NEGATIVE   Bilirubin Urine NEGATIVE  NEGATIVE   Ketones, ur NEGATIVE  NEGATIVE mg/dL   Protein, ur NEGATIVE  NEGATIVE mg/dL   Urobilinogen, UA 0.2  0.0 - 1.0 mg/dL   Nitrite NEGATIVE  NEGATIVE   Leukocytes, UA NEGATIVE  NEGATIVE   Comment: MICROSCOPIC NOT DONE ON URINES WITH NEGATIVE PROTEIN, BLOOD, LEUKOCYTES,  NITRITE, OR GLUCOSE <1000 mg/dL.   Dg Chest 2 View  11/29/2013   CLINICAL DATA:  Chest pain today, history of CABG.  EXAM: CHEST  2 VIEW  COMPARISON:  Chest radiograph May 27, 2010  FINDINGS:  Cardiac silhouette is normal in size, mediastinal silhouette is not enlarged, status post median sternotomy for apparent coronary artery bypass grafting. Similar mild chronic interstitial changes with increased lung volumes, flattened hemidiaphragms. No pleural effusions or focal consolidations. No pneumothorax.  Straightened thoracic kyphosis mild degenerative spondylosis.  IMPRESSION: COPD, no acute cardiopulmonary process.   Electronically Signed   By: Elon Alas   On: 11/29/2013 06:51    VITALS: Blood pressure 120/61, pulse 61, temperature 97.8 F (36.6 C), temperature source Oral, resp. rate 19, height 6\' 4"  (1.93 m), weight 205 lb (92.987 kg), SpO2 97.00%.  EXAM: General appearance: alert, cooperative and no distress Neck: no adenopathy, no JVD, supple, symmetrical, trachea midline and thyroid not enlarged, symmetric, no tenderness/mass/nodules Lungs: clear to auscultation bilaterally Heart: regular rate and rhythm and 1/6 sem Abdomen: soft, non-tender; bowel sounds normal; no masses,  no organomegaly Extremities: no edema, redness or tenderness in the calves or thighs Pulses: 2+ and symmetric Skin: Skin color, texture, turgor normal. No rashes or lesions Neurologic: Grossly normal   ECG: NSR at 90; old Anterior MI; no acute STT changes   IMPRESSION: 1. unstable angina pectoris 2. coronary artery disease: Status post initial CABG surgery 1992, and redo surgery 2012 3. paroxysmal atrial fibrillation 4. anticoagulation with Eliquis 5. Hyperlipidemia 6. Hypertension 7.  Remote history of bladder Ca 8.  Bilateral knee arthritis 9. history of cataract  PLAN: Shawn [name] is now 25 years status post initial CABG revascularization surgery, and 3 years status post redo bypass.  He was  awakened from sleep.  This a.m. with chest and neck discomfort that had similar characteristics of his previous cardiac pain.  His chest pain ultimately improved with nitroglycerin.  He denies any prior history of known GERD.  His ECG shows evidence for an old anterior myocardial infarction.  He will be admitted to the hospital and treated for unstable angina.  Eliquis will be held.  He will be started on heparin per pharmacy with plans to perform cardiac catheterization possibly tomorrow afternoon or the next morning to allow time for Eliquis washout.  Nitrate therapy will be added to his beta blocker regimen.  In light of current recommendations of dosing of simvastatin this may need to be changed to atorvastatin or crestor for continued aggressive treatment with a target LDL less than 70.  Troy Sine, MD, [facility] 11/29/2013  9:18 AM

## 2013-11-29 NOTE — Progress Notes (Signed)
ANTICOAGULATION CONSULT NOTE - Follow Up Consult  Pharmacy Consult:  Heparin Indication: chest pain/ACS  Allergies  Allergen Reactions  . Penicillins Rash    Patient Measurements: Height: 6\' 5"  (195.6 cm) Weight: 193 lb 8 oz (87.771 kg) IBW/kg (Calculated) : 89.1 Heparin Dosing Weight: 93 kg  Vital Signs: Temp: 98.4 F (36.9 C) (10/13 2100) Temp Source: Oral (10/13 2100) BP: 104/59 mmHg (10/13 2100) Pulse Rate: 70 (10/13 2100)  Labs:  Recent Labs  11/29/13 0600 11/29/13 0852 11/29/13 1008 11/29/13 1600 11/29/13 2044  HGB 14.0  --  13.6  --   --   HCT 41.3  --  41.3  --   --   PLT 141*  --  135*  --   --   APTT 39*  --   --   --  75*  LABPROT  --   --  14.8  --   --   INR  --   --  1.16  --   --   HEPARINUNFRC  --   --  1.90*  --   --   CREATININE  --   --  0.86  --   --   TROPONINI  --  <0.30 <0.30 <0.30  --     Estimated Creatinine Clearance: 92.2 ml/min (by C-G formula based on Cr of 0.86).   Medical History: Past Medical History  Diagnosis Date  . Anginal pain   . Myocardial infarction 11/1990  . Anterior myocardial infarction 09/2009    [name] 04/22/2010  . Coronary artery disease   . Arthritis     "knees" (11/29/2013)   Assessment: 77 YOM on Eliquis PTA for history of Afib (last dose 11/28/13 at 1900).  Pharmacy consulted to transition patient to IV heparin for [place].    Coag:  ACS, aPTT at goal. Using aPTT to assist with heparin dosing as Eliquis could falsely elevate heparin levels.  No bleeding reported.   Goal of Therapy:  Heparin level 0.3-0.7 units/ml aPTT 66 - 102 seconds Monitor platelets by anticoagulation protocol: Yes    Plan:  -  Continue Heparin gtt at 1300 units/hr - Daily aPTT / HL / CBC  Thank you for allowing pharmacy to be a part of this patients care team.  Rowe Robert Pharm.D., BCPS, AQ-Cardiology Clinical Pharmacist 11/29/2013 9:46 PM Pager: [phone] Phone: [phone]

## 2013-11-29 NOTE — ED Notes (Signed)
Cardiology at bedside.

## 2013-11-29 NOTE — ED Notes (Addendum)
Comes from home via [facility], c/o CP sine 3am today, on right side radiating to jaw,  PTA pt took nitro at home X 2, EMS gave 324 ASA. Denies N/V, SOB  Hx of two open heart surgeries and stents.

## 2013-11-29 NOTE — ED Provider Notes (Signed)
CSN: [id number]     Arrival date & time 11/29/13  0551 History   First MD Initiated Contact with Patient 11/29/13 [id number]     Chief Complaint  Patient presents with  . Chest Pain     (Consider location/radiation/quality/duration/timing/severity/associated sxs/prior Treatment) HPI Pt is a 78yo male with hx of CAD with 2 open heart surgeries and 3 stents placed, hx of paroxysmal afib taking Eliquis, presenting to ED with c/o right sided chest pain that radiated into his jaw, woke him around 3AM and lasted until 3:45AM.  Pain initially resolved so pt denied EMS transport, however, pain came back around 4AM. Pt took 2 nitro at home, EMS gave 324mg  ASA.  Pt denies SOB, nausea or vomiting. States pain is similar to previous episodes when he required intervention such as open heart surgery or stent placement.  Pain upon arrival to ED 0-1/10.  Pt is followed by Dr. Daneen Schick, [facility].    History reviewed. No pertinent past medical history. Past Surgical History  Procedure Laterality Date  . Cardiac surgery     Family History  Problem Relation Age of Onset  . Heart disease Father    History  Substance Use Topics  . Smoking status: Light Tobacco Smoker -- 40 years    Types: Cigars    Start date: 02/17/1973  . Smokeless tobacco: Not on file     Comment: Occasional cigar  . Alcohol Use: No    Review of Systems  Constitutional: Negative for fever, chills, diaphoresis and fatigue.  Respiratory: Negative for cough and shortness of breath.   Cardiovascular: Positive for chest pain. Negative for palpitations and leg swelling.  Gastrointestinal: Negative for nausea, vomiting and abdominal pain.  Neurological: Negative for syncope and weakness.  All other systems reviewed and are negative.     Allergies  Penicillins  Home Medications   Prior to Admission medications   Medication Sig Start Date End Date Taking? Authorizing Provider  apixaban (ELIQUIS) 5 MG TABS tablet take 1  tablet by mouth twice a day 11/04/13  Yes [name] III, MD  atenolol (TENORMIN) 25 MG tablet Take 1 tablet by mouth daily. 05/15/13  Yes Historical Provider, MD  simvastatin (ZOCOR) 80 MG tablet Take 1 tablet by mouth every morning. 06/14/13  Yes Historical Provider, MD   BP 124/59  Pulse 58  Temp(Src) 97.8 F (36.6 C) (Oral)  Resp 17  Ht 6\' 4"  (1.93 m)  Wt 205 lb (92.987 kg)  BMI 24.96 kg/m2  SpO2 98% Physical Exam  Nursing note and vitals reviewed. Constitutional: He appears well-developed and well-nourished.  Pt lying comfortably in exam bed, NAD  HENT:  Head: Normocephalic and atraumatic.  Eyes: Conjunctivae are normal. No scleral icterus.  Neck: Normal range of motion.  Cardiovascular: Normal rate, regular rhythm and normal heart sounds.   Regular rate and rhythm  Pulmonary/Chest: Effort normal and breath sounds normal. No respiratory distress. He has no wheezes. He has no rales. He exhibits no tenderness.  No respiratory distress, able to speak in full sentences w/o difficulty. Lungs: CTAB  Abdominal: Soft. Bowel sounds are normal. He exhibits no distension and no mass. There is no tenderness. There is no rebound and no guarding.  Musculoskeletal: Normal range of motion.  Neurological: He is alert.  Skin: Skin is warm and dry.    ED Course  Procedures (including critical care time) Labs Review Labs Reviewed  APTT - Abnormal; Notable for the following:    aPTT 39 (*)  All other components within normal limits  CBC - Abnormal; Notable for the following:    Platelets 141 (*)    All other components within normal limits  PRO B NATRIURETIC PEPTIDE - Abnormal; Notable for the following:    Pro B Natriuretic peptide (BNP) 739.7 (*)    All other components within normal limits  CBC WITH DIFFERENTIAL - Abnormal; Notable for the following:    Platelets 135 (*)    All other components within normal limits  URINALYSIS, ROUTINE W REFLEX MICROSCOPIC  TROPONIN I   COMPREHENSIVE METABOLIC PANEL  TSH  TROPONIN I  TROPONIN I  TROPONIN I  PROTIME-INR  HEPARIN LEVEL (UNFRACTIONATED)  I-STAT TROPOININ, ED    Imaging Review Dg Chest 2 View  11/29/2013   CLINICAL DATA:  Chest pain today, history of CABG.  EXAM: CHEST  2 VIEW  COMPARISON:  Chest radiograph May 27, 2010  FINDINGS: Cardiac silhouette is normal in size, mediastinal silhouette is not enlarged, status post median sternotomy for apparent coronary artery bypass grafting. Similar mild chronic interstitial changes with increased lung volumes, flattened hemidiaphragms. No pleural effusions or focal consolidations. No pneumothorax.  Straightened thoracic kyphosis mild degenerative spondylosis.  IMPRESSION: COPD, no acute cardiopulmonary process.   Electronically Signed   By: Elon Alas   On: 11/29/2013 06:51     EKG Interpretation   Date/Time:  Tuesday November 29 2013 06:00:08 EDT Ventricular Rate:  61 PR Interval:  206 QRS Duration: 103 QT Interval:  407 QTC Calculation: 410 R Axis:   93 Text Interpretation:  Sinus rhythm Anteroseptal infarct, age indeterminate  Baseline wander in lead(s) V4 Confirmed by Wolverine  MD, Newell ([id number]) on  11/29/2013 7:24:59 AM      MDM   Final diagnoses:  Other chest pain    Pt is a 78yo male with hx of CAD including 2 open heart surgeries and 3 stents. Seen by Dr. Daneen Schick, cardiology. Presenting to ED with c/o chest pain radiating into his jaw. Started around 3AM, resolved with Nitro x2, pain came back around 4AM. Upon arrival to ED pain 0-1/10.  No diaphoresis, SOB, cough, congestion, nausea or vomiting. Initial cardiac workup unremarkable.  7:26 AM Consulted with Joellen Jersey, PA-C, cardiology, who agreed to come evaluate the pt.  9:35 AM Consulted with Dr. Shelva Majestic, cardiology, who plans to admit pt for CP r/o, will possibly perform heart cath during admission.        Noland Fordyce, PA-C 11/29/13 1049

## 2013-11-29 NOTE — Progress Notes (Signed)
ANTICOAGULATION CONSULT NOTE - Initial Consult  Pharmacy Consult:  Heparin Indication: chest pain/ACS  Allergies  Allergen Reactions  . Penicillins Rash    Patient Measurements: Height: 6\' 4"  (193 cm) Weight: 205 lb (92.987 kg) IBW/kg (Calculated) : 86.8 Heparin Dosing Weight: 93 kg  Vital Signs: Temp: 97.8 F (36.6 C) (10/13 0648) Temp Source: Oral (10/13 0648) BP: 116/57 mmHg (10/13 0900) Pulse Rate: 56 (10/13 0900)  Labs:  Recent Labs  11/29/13 0600 11/29/13 0852  HGB 14.0  --   HCT 41.3  --   PLT 141*  --   APTT 39*  --   TROPONINI  --  <0.30    Estimated Creatinine Clearance: 87.1 ml/min (by C-G formula based on Cr of 0.9).   Medical History: History reviewed. No pertinent past medical history.    Assessment: 77 YOM on Eliquis PTA for history of Afib (last dose 11/28/13 at 1900).  Pharmacy consulted to transition patient to IV heparin for [place].  Noted plan for cath within 24-48 hours.  APTT low at 39; heparin level pending.  Will use aPTT to assist with heparin dosing as Eliquis could falsely elevate heparin levels.  No bleeding reported.   Goal of Therapy:  Heparin level 0.3-0.7 units/ml aPTT 66 - 102 seconds Monitor platelets by anticoagulation protocol: Yes    Plan:  - Heparin gtt at 1300 units/hr - Check aPTT 8 hrs post gtt started - Daily aPTT / HL / CBC    Aviyon Hocevar D. Mina Marble, PharmD, BCPS Pager:  [phone] 11/29/2013, 11:31 AM

## 2013-11-29 NOTE — ED Notes (Signed)
[facility] meal tray ordered for Pt.

## 2013-11-29 NOTE — ED Notes (Signed)
Meal Tray ordered.  

## 2013-11-30 ENCOUNTER — Encounter (HOSPITAL_COMMUNITY)
Admission: EM | Disposition: A | Discharge status: Home / Self Care | Payer: Medicare Other | Source: Home / Self Care | Attending: Interventional Cardiology

## 2013-11-30 DIAGNOSIS — Z7901 Long term (current) use of anticoagulants: Secondary | ICD-10-CM

## 2013-11-30 DIAGNOSIS — I2510 Atherosclerotic heart disease of native coronary artery without angina pectoris: Secondary | ICD-10-CM

## 2013-11-30 HISTORY — PX: LEFT HEART CATHETERIZATION WITH CORONARY/GRAFT ANGIOGRAM: SHX5450

## 2013-11-30 LAB — BASIC METABOLIC PANEL
Anion gap: 11 (ref 5–15)
BUN: 19 mg/dL (ref 6–23)
CHLORIDE: 104 meq/L (ref 96–112)
CO2: 27 meq/L (ref 19–32)
Calcium: 8.8 mg/dL (ref 8.4–10.5)
Creatinine, Ser: 0.9 mg/dL (ref 0.50–1.35)
GFR calc Af Amer: >90 mL/min (ref >90)
GFR calc non Af Amer: 81 mL/min — ABNORMAL LOW (ref >90)
GLUCOSE: 132 mg/dL — AB (ref 70–99)
Potassium: 4.2 mEq/L (ref 3.7–5.3)
SODIUM: 142 meq/L (ref 137–147)

## 2013-11-30 LAB — LIPID PANEL
CHOL/HDL RATIO: 3.7 ratio
CHOLESTEROL: 121 mg/dL (ref 0–200)
HDL: 33 mg/dL — AB (ref >39)
LDL Cholesterol: 70 mg/dL (ref 0–99)
TRIGLYCERIDES: 92 mg/dL (ref <150)
VLDL: 18 mg/dL (ref 0–40)

## 2013-11-30 LAB — CBC
HEMATOCRIT: 42.6 % (ref 39.0–52.0)
HEMOGLOBIN: 14.3 g/dL (ref 13.0–17.0)
MCH: 30.9 pg (ref 26.0–34.0)
MCHC: 33.6 g/dL (ref 30.0–36.0)
MCV: 92 fL (ref 78.0–100.0)
Platelets: 154 10*3/uL (ref 150–400)
RBC: 4.63 MIL/uL (ref 4.22–5.81)
RDW: 14.3 % (ref 11.5–15.5)
WBC: 8.5 10*3/uL (ref 4.0–10.5)

## 2013-11-30 LAB — POCT ACTIVATED CLOTTING TIME: Activated Clotting Time: 118 seconds

## 2013-11-30 LAB — HEPARIN LEVEL (UNFRACTIONATED): HEPARIN UNFRACTIONATED: 0.97 [IU]/mL — AB (ref 0.30–0.70)

## 2013-11-30 LAB — APTT: aPTT: 91 seconds — ABNORMAL HIGH (ref 24–37)

## 2013-11-30 SURGERY — LEFT HEART CATHETERIZATION WITH CORONARY/GRAFT ANGIOGRAM
Anesthesia: LOCAL

## 2013-11-30 MED ORDER — LIDOCAINE HCL (PF) 1 % IJ SOLN
INTRAMUSCULAR | Status: AC
  Filled 2013-11-30: qty 30

## 2013-11-30 MED ORDER — DIPHENHYDRAMINE HCL 50 MG/ML IJ SOLN
25.0000 mg | Freq: Once | INTRAMUSCULAR | Status: AC
  Administered 2013-11-30: 25 mg via INTRAVENOUS

## 2013-11-30 MED ORDER — SODIUM CHLORIDE 0.9 % IJ SOLN: 3.0000 mL | Freq: Two times a day (BID) | INTRAMUSCULAR | Status: DC

## 2013-11-30 MED ORDER — MORPHINE SULFATE 2 MG/ML IJ SOLN
2.0000 mg | Freq: Once | INTRAMUSCULAR | Status: AC
  Administered 2013-11-30: 2 mg via INTRAVENOUS
  Filled 2013-11-30: qty 1

## 2013-11-30 MED ORDER — ISOSORBIDE MONONITRATE ER 30 MG PO TB24
30.0000 mg | ORAL_TABLET | Freq: Every day | ORAL | Status: DC
  Administered 2013-12-01: 30 mg via ORAL
  Filled 2013-11-30: qty 1

## 2013-11-30 MED ORDER — PANTOPRAZOLE SODIUM 40 MG PO TBEC
40.0000 mg | DELAYED_RELEASE_TABLET | Freq: Every day | ORAL | Status: DC
  Administered 2013-11-30: 40 mg via ORAL
  Filled 2013-11-30: qty 1

## 2013-11-30 MED ORDER — FENTANYL CITRATE 0.05 MG/ML IJ SOLN
INTRAMUSCULAR | Status: AC
  Filled 2013-11-30: qty 2

## 2013-11-30 MED ORDER — SODIUM CHLORIDE 0.9 % IV SOLN
250.0000 mL | INTRAVENOUS | Status: DC | PRN
Start: 2013-11-30 — End: 2013-11-30

## 2013-11-30 MED ORDER — APIXABAN 5 MG PO TABS
5.0000 mg | ORAL_TABLET | Freq: Two times a day (BID) | ORAL | Status: DC
  Administered 2013-11-30 – 2013-12-01 (×2): 5 mg via ORAL
  Filled 2013-11-30 (×3): qty 1

## 2013-11-30 MED ORDER — ACETAMINOPHEN 325 MG PO TABS: 650.0000 mg | ORAL_TABLET | ORAL | Status: DC | PRN

## 2013-11-30 MED ORDER — HEPARIN (PORCINE) IN NACL 2-0.9 UNIT/ML-% IJ SOLN
INTRAMUSCULAR | Status: AC
  Filled 2013-11-30: qty 1500

## 2013-11-30 MED ORDER — SODIUM CHLORIDE 0.9 % IV SOLN: INTRAVENOUS | Status: DC

## 2013-11-30 MED ORDER — ASPIRIN 81 MG PO CHEW
81.0000 mg | CHEWABLE_TABLET | ORAL | Status: AC
  Administered 2013-11-30: 81 mg via ORAL
  Filled 2013-11-30: qty 1

## 2013-11-30 MED ORDER — NITROGLYCERIN 1 MG/10 ML FOR IR/CATH LAB
INTRA_ARTERIAL | Status: AC
  Filled 2013-11-30: qty 10

## 2013-11-30 MED ORDER — DIPHENHYDRAMINE HCL 50 MG/ML IJ SOLN
INTRAMUSCULAR | Status: AC
  Filled 2013-11-30: qty 1

## 2013-11-30 MED ORDER — MIDAZOLAM HCL 2 MG/2ML IJ SOLN
INTRAMUSCULAR | Status: AC
  Filled 2013-11-30: qty 2

## 2013-11-30 MED ORDER — ASPIRIN EC 81 MG PO TBEC
81.0000 mg | DELAYED_RELEASE_TABLET | Freq: Every day | ORAL | Status: DC
  Administered 2013-12-01: 81 mg via ORAL
  Filled 2013-11-30: qty 1

## 2013-11-30 MED ORDER — ONDANSETRON HCL 4 MG/2ML IJ SOLN: 4.0000 mg | Freq: Four times a day (QID) | INTRAMUSCULAR | Status: DC | PRN

## 2013-11-30 MED ORDER — MORPHINE SULFATE 2 MG/ML IJ SOLN: 1.0000 mg | Freq: Once | INTRAMUSCULAR | Status: DC

## 2013-11-30 MED ORDER — SODIUM CHLORIDE 0.9 % IJ SOLN: 3.0000 mL | INTRAMUSCULAR | Status: DC | PRN

## 2013-11-30 MED ORDER — SODIUM CHLORIDE 0.9 % IV SOLN
INTRAVENOUS | Status: DC
  Administered 2013-11-30: 21:00:00 via INTRAVENOUS

## 2013-11-30 NOTE — Progress Notes (Signed)
Cardiologist on call notified of pt c/o of 8 out of 10 rt sided cp radiating to his neck. Pt given 1 SL nitro which dropped his pressure to 89/48 so he was unable to get a second one. EKG was taken & it  showed NSR. After the SL nitro pt was still c/o of 8 out of 10 rt sided cp. No new orders given at this time. Will continue to monitor the pt. Hoover Brunette, RN

## 2013-11-30 NOTE — Progress Notes (Addendum)
Pt's VS were retaken & bp increased to 112/36 . Another EKG was obtained d/t arm leads being opposite. New EKG showed NSR with possible left atrial enlargement & a septal infarct with an undetermined age. Pt's pain currently down to 2 out of 10 after 1 SL nitro.  Cardiologist  Elias Else called back to check on the pt several times.Will continue to monitor the pt. Hoover Brunette, RN

## 2013-11-30 NOTE — Progress Notes (Signed)
Pt c/o of itching all over. Rash noted on his back, arms, legs & neck. Cardiologist on call notified. New orders for IV benadryl once.Will continue to monitor the pt closely for any respiratory reactions. Hoover Brunette, RN

## 2013-11-30 NOTE — Progress Notes (Signed)
Utilization review completed.  

## 2013-11-30 NOTE — CV Procedure (Signed)
Shawn [name] is a 78 y.o. male    [id number]  [id number] LOCATION:  FACILITY: [facility]  PHYSICIAN: Troy Sine, MD, [facility] 12/23/1935   DATE OF PROCEDURE:  11/30/2013    CARDIAC CATHETERIZATION     HISTORY:    Shawn [name] is a 78 y.o. male  Who has documented coronary artery disease and in 1992 underwent initial CABG revascularization surgery by Dr. Redmond Pulling with a LIMA graft to his circumflex, vein graft to his LAD, and vein graft to his RCA.  He underwent stenting of his left circumflex vessel in 2004 and  2005 and in 2008 bare-metal stent placement vein graft to the LAD. In August 2011 he suffered a myocardial infarction and was treated at [facility] in [place] and had 2 additional stents placed in the vein graft to the LAD. In March 2012 . Repeat catheterization showed a widely patent left main, total LAD occlusion after a large branching septal perforator, diffuse disease in the mid circumflex with moderate in-stent restenosis and the RCA was occluded at the mid vessel. The vein to the LAD had multiple stents and there was 70% eccentric and 90% plus stenoses in the mid body of the graft. He underwent redo CABG surgery x2 on 05/02/2002 by Dr. Cyndia Bent , with a right internal mammary artery to the LAD, and a saphenous vein graft to the circumflex marginal vessel via endoscopic vein harvesting from the left leg. The patient also has a history of paroxysmal atrial fibrillation/flutter, which remotely. He has been on Coumadin therapy, and more recently Eliquis.  He developed chest pain awakening him from sleep which he felt was similar to his prior heart pain leading to his hospitalization.  Eliquis has been held for approximately 40 hours and he presents for definitive cardiac catheterization.    PROCEDURE: Left heart catheterization: Coronary angiography into the native coronary arteries, Vein grafts, LIMA and RIMA coronary arteries; left ventriculography  The patient  was brought to the [facility] cardiac catherization labaratory in the postabsorptive state. He  was premedicated with Benadryl 25 mg since he did have an erythematous rash noted prior to procedure, in addition to Versed, 2 mg and fentanyl 25 mcg. His right groin was prepped and shaved in usual sterile fashion. Xylocaine 1% was used for local anesthesia. A 5 French sheath was inserted into the R femoral artery. Diagnostic catheterizatiion was done with 5 [place] FL4, FR4, right bypass graft, LIMA, and pigtail catheters. Left ventriculography was done with 26 cc Omnipaque contrast. Hemostasis was obtained by direct manual compression. The patient tolerated the procedure well.   HEMODYNAMICS:   Central Aorta: 119/53   Left Ventricle: 119/15  ANGIOGRAPHY:  Left main: Angiographically normal vessel, which bifurcated into the LAD and left circumflex vessel.  LAD: Moderate size vessel that gave rise to a proximal septal perforating artery and small diagonal vessel.  The LAD was then occluded.  Left circumflex: Moderate size vessel that had 60-70% proximal stenosis.  In the mid and distal portion of the vessel was a stent with mild to moderate intimal hyperplasia and "flush and fill" was seen beyond the stented site  Right coronary artery: Medium sized vessel that had diffuse 70-90% mid-distal stenoses.  Prior to being totally occluded.  There was bridging collaterals supplying the RCA from the very proximal branch.  1992 surgery:  Atretic LIMA graft which had supplied the circumflex marginal vessel  Occlusion of the vein graft which had supplied the LAD                         Occluded vein graft, which had supplied the RCA  2012 surgery: Patent SVG graft supplying the distal circumflex marginal vessel                        Patent RIMA graft supplying the LAD   Left ventriculography revealed  mild-to-moderate LV dysfunction globally with an ejection fraction of 40-45%.   There is mild distal inferior hypocontractility.  Total contrast used: 135 cc Omnipaque  IMPRESSION:  Mild/moderate LV dysfunction with an ejection fraction of 40-45% and mild inferior hypocontractility.  Significant native coronary obstructive disease with total occlusion of the LAD after the proximal septal perforating artery; 60-70% proximal circumflex stenosis with evidence for mid circumflex marginal stent with mild- moderate intimal hyperplasia; total occlusion of the RCA with diffuse mid 70-90% stenoses and evidence for bridging collaterals to the distal RCA from a proximal branch.  Patent RIMA graft from 2012 surgery supplying the LAD.  Patent saphenous vein graft from 2012 supplying the obtuse marginal vessel.  1992 grafts with an atretic LIMA, which had supplied the marginal vessel, an occluded vein graft which had supplied the LAD, and an occluded SVG which had supplied the RCA.  RECOMMENDATION:  Increased medical therapy will be recommended.    Troy Sine, MD, [facility] 11/30/2013 2:51 PM

## 2013-11-30 NOTE — H&P (View-Only) (Signed)
  Primary cardiologist: Dr. Daneen Schick   Subjective:  Feels well. No current CP  Objective:  Vital Signs in the last 24 hours: Temp:  [98.1 F (36.7 C)-98.4 F (36.9 C)] 98.1 F (36.7 C) (10/14 0355) Pulse Rate:  [49-94] 73 (10/14 1027) Resp:  [15-20] 20 (10/14 0355) BP: (85-125)/(31-102) 109/67 mmHg (10/14 1027) SpO2:  [95 %-99 %] 99 % (10/14 0355) Weight:  [193 lb 8 oz (87.771 kg)] 193 lb 8 oz (87.771 kg) (10/13 1542)  Intake/Output from previous day: 10/13 0701 - 10/14 0700 In: 360 [P.O.:360] Out: 600 [Urine:600]   Physical Exam: General: Well developed, well nourished, in no acute distress. Tall Head:  Normocephalic and atraumatic. Lungs: Clear to auscultation and percussion. Heart: Normal S1 and S2.  No murmur, rubs or gallops.  Abdomen: soft, non-tender, positive bowel sounds. Extremities: No clubbing or cyanosis. No edema. Neurologic: Alert and oriented x 3.    Lab Results:  Recent Labs  11/29/13 1008 11/30/13 0400  WBC 4.8 8.5  HGB 13.6 14.3  PLT 135* 154    Recent Labs  11/29/13 1008 11/30/13 0400  NA 142 142  K 4.3 4.2  CL 103 104  CO2 30 27  GLUCOSE 118* 132*  BUN 17 19  CREATININE 0.86 0.90    Recent Labs  11/29/13 1600 11/29/13 2044  TROPONINI <0.30 <0.30   Hepatic Function Panel  Recent Labs  11/29/13 1008  PROT 6.6  ALBUMIN 3.3*  AST 19  ALT 17  ALKPHOS 103  BILITOT 0.4    Recent Labs  11/30/13 0400  CHOL 121     Imaging: Dg Chest 2 View  11/29/2013   CLINICAL DATA:  Chest pain today, history of CABG.  EXAM: CHEST  2 VIEW  COMPARISON:  Chest radiograph May 27, 2010  FINDINGS: Cardiac silhouette is normal in size, mediastinal silhouette is not enlarged, status post median sternotomy for apparent coronary artery bypass grafting. Similar mild chronic interstitial changes with increased lung volumes, flattened hemidiaphragms. No pleural effusions or focal consolidations. No pneumothorax.  Straightened thoracic  kyphosis mild degenerative spondylosis.  IMPRESSION: COPD, no acute cardiopulmonary process.   Electronically Signed   By: Elon Alas   On: 11/29/2013 06:51   Personally viewed.   Telemetry: No adverse rhythm Personally viewed.   Assessment/Plan:  Active Problems:   Unstable angina  78 year old with CAD, CABG with redo in 2004, PAF, with unstable angina.  1) Unstable angina  - heart cath today  - Eliquis on hold  - Imdur, Heparin IV, Bb, ASA, statin high dose.   2) PAF  - restart Eliquis post cath  - Now NSR  Will adjust plan post cath accordingly.      Tregan Read, [place] 11/30/2013, 10:45 AM

## 2013-11-30 NOTE — Progress Notes (Signed)
ANTICOAGULATION CONSULT NOTE - Follow Up Consult  Pharmacy Consult:  Heparin Indication: chest pain/ACS  Allergies  Allergen Reactions  . Penicillins Rash    Patient Measurements: Height: 6\' 5"  (195.6 cm) Weight: 193 lb 8 oz (87.771 kg) IBW/kg (Calculated) : 89.1 Heparin Dosing Weight: 93 kg  Vital Signs: Temp: 98.1 F (36.7 C) (10/14 0355) BP: 100/47 mmHg (10/14 1445) Pulse Rate: 58 (10/14 1445)  Labs:  Recent Labs  11/29/13 0600  11/29/13 1008 11/29/13 1600 11/29/13 2044 11/30/13 0400  HGB 14.0  --  13.6  --   --  14.3  HCT 41.3  --  41.3  --   --  42.6  PLT 141*  --  135*  --   --  154  APTT 39*  --   --   --  75* 91*  LABPROT  --   --  14.8  --   --   --   INR  --   --  1.16  --   --   --   HEPARINUNFRC  --   --  1.90*  --   --  0.97*  CREATININE  --   --  0.86  --   --  0.90  TROPONINI  --   < > <0.30 <0.30 <0.30  --   < > = values in this interval not displayed.  Estimated Creatinine Clearance: 88.1 ml/min (by C-G formula based on Cr of 0.9).   Medical History: Past Medical History  Diagnosis Date  . Anginal pain   . Myocardial infarction 11/1990  . Anterior myocardial infarction 09/2009    [name] 04/22/2010  . Coronary artery disease   . Arthritis     "knees" (11/29/2013)   Assessment: 77 YOM on Eliquis PTA for history of Afib (last dose 11/28/13 at 1900) transitioned to heparin this admission for further cardiology work-up.   Heparin level remains falsely elevated this AM however aPTT is therapeutic (aPTT 91, goal of 66-102 seconds). Noted plans for cardiac cath today - will f/u heparin plans post-procedure.   Goal of Therapy:  Heparin level 0.3-0.7 units/ml aPTT 66 - 102 seconds Monitor platelets by anticoagulation protocol: Yes    Plan:  1. Continue heparin at 1300 units/hr (13 ml/hr) 2. Will continue to monitor for any signs/symptoms of bleeding and will follow up with aPTT and heparin level in the a.m vs post-cath  Alycia Rossetti,  PharmD, BCPS Clinical Pharmacist Pager: [phone] 11/30/2013 2:51 PM

## 2013-11-30 NOTE — Progress Notes (Addendum)
  Primary cardiologist: Dr. Daneen Schick   Subjective:  Feels well. No current CP  Objective:  Vital Signs in the last 24 hours: Temp:  [98.1 F (36.7 C)-98.4 F (36.9 C)] 98.1 F (36.7 C) (10/14 0355) Pulse Rate:  [49-94] 73 (10/14 1027) Resp:  [15-20] 20 (10/14 0355) BP: (85-125)/(31-102) 109/67 mmHg (10/14 1027) SpO2:  [95 %-99 %] 99 % (10/14 0355) Weight:  [193 lb 8 oz (87.771 kg)] 193 lb 8 oz (87.771 kg) (10/13 1542)  Intake/Output from previous day: 10/13 0701 - 10/14 0700 In: 360 [P.O.:360] Out: 600 [Urine:600]   Physical Exam: General: Well developed, well nourished, in no acute distress. Tall Head:  Normocephalic and atraumatic. Lungs: Clear to auscultation and percussion. Heart: Normal S1 and S2.  No murmur, rubs or gallops.  Abdomen: soft, non-tender, positive bowel sounds. Extremities: No clubbing or cyanosis. No edema. Neurologic: Alert and oriented x 3.    Lab Results:  Recent Labs  11/29/13 1008 11/30/13 0400  WBC 4.8 8.5  HGB 13.6 14.3  PLT 135* 154    Recent Labs  11/29/13 1008 11/30/13 0400  NA 142 142  K 4.3 4.2  CL 103 104  CO2 30 27  GLUCOSE 118* 132*  BUN 17 19  CREATININE 0.86 0.90    Recent Labs  11/29/13 1600 11/29/13 2044  TROPONINI <0.30 <0.30   Hepatic Function Panel  Recent Labs  11/29/13 1008  PROT 6.6  ALBUMIN 3.3*  AST 19  ALT 17  ALKPHOS 103  BILITOT 0.4    Recent Labs  11/30/13 0400  CHOL 121     Imaging: Dg Chest 2 View  11/29/2013   CLINICAL DATA:  Chest pain today, history of CABG.  EXAM: CHEST  2 VIEW  COMPARISON:  Chest radiograph May 27, 2010  FINDINGS: Cardiac silhouette is normal in size, mediastinal silhouette is not enlarged, status post median sternotomy for apparent coronary artery bypass grafting. Similar mild chronic interstitial changes with increased lung volumes, flattened hemidiaphragms. No pleural effusions or focal consolidations. No pneumothorax.  Straightened thoracic  kyphosis mild degenerative spondylosis.  IMPRESSION: COPD, no acute cardiopulmonary process.   Electronically Signed   By: Elon Alas   On: 11/29/2013 06:51   Personally viewed.   Telemetry: No adverse rhythm Personally viewed.   Assessment/Plan:  Active Problems:   Unstable angina  78 year old with CAD, CABG with redo in 2004, PAF, with unstable angina.  1) Unstable angina  - heart cath today  - Eliquis on hold  - Imdur, Heparin IV, Bb, ASA, statin high dose.   2) PAF  - restart Eliquis post cath  - Now NSR  Will adjust plan post cath accordingly.      Shawn [name], [place] 11/30/2013, 10:45 AM

## 2013-11-30 NOTE — Progress Notes (Signed)
Site area: rt groin Site Prior to Removal:  Level 0 Pressure Applied For: 20 minutes; sheath removed by LMurphy<RN Manual:   yes Patient Status During Pull:  stable Post Pull Site:  Level 0 Post Pull Instructions Given:  yes Post Pull Pulses Present: yes Dressing Applied:  tegaderm dressing Bedrest begins @ 1500 Comments: no complicationd

## 2013-11-30 NOTE — Progress Notes (Signed)
Pt c/o of 5 out 10 rt sided cp radiating to lt chest & rt jaw. Pt given 1 SL nitro. Pt also had 9 beats of non-sustained v-tach. Cardiologist on call notified. New orders for 2mg  of morphine iv once. Pain down to 3 out of 10. Will continue to monitor the pt. Hoover Brunette, RN

## 2013-11-30 NOTE — Progress Notes (Signed)
Pt refused to watch cath video. Pt has had caths in the past. Consent & labs have been done as well as pre-cath fluids started. Shawn [name]

## 2013-11-30 NOTE — Interval H&P Note (Signed)
Cath Lab Visit (complete for each Cath Lab visit)  Clinical Evaluation Leading to the Procedure:   ACS: No.  Non-ACS:    Anginal Classification: CCS IV  Anti-ischemic medical therapy: Maximal Therapy (2 or more classes of medications)  Non-Invasive Test Results: No non-invasive testing performed  Prior CABG: Previous CABG      History and Physical Interval Note:  11/30/2013 1:21 PM  [name]  has presented today for surgery, with the diagnosis of cp  The various methods of treatment have been discussed with the patient and family. After consideration of risks, benefits and other options for treatment, the patient has consented to  Procedure(s): LEFT HEART CATHETERIZATION WITH CORONARY/GRAFT ANGIOGRAM (N/A) as a surgical intervention .  The patient's history has been reviewed, patient examined, no change in status, stable for surgery.  I have reviewed the patient's chart and labs.  Questions were answered to the patient's satisfaction.     KELLY,THOMAS A

## 2013-12-01 DIAGNOSIS — R0789 Other chest pain: Secondary | ICD-10-CM

## 2013-12-01 DIAGNOSIS — I25110 Atherosclerotic heart disease of native coronary artery with unstable angina pectoris: Secondary | ICD-10-CM | POA: Diagnosis not present

## 2013-12-01 MED ORDER — HYDROCORTISONE 1 % EX CREA
TOPICAL_CREAM | Freq: Four times a day (QID) | CUTANEOUS | Status: DC | PRN
  Administered 2013-12-01: 09:00:00 via TOPICAL
  Administered 2013-12-01: 1 via TOPICAL
  Filled 2013-12-01 (×2): qty 28

## 2013-12-01 MED ORDER — ASPIRIN 81 MG PO TBEC: 81.0000 mg | DELAYED_RELEASE_TABLET | Freq: Every day | ORAL | Status: DC

## 2013-12-01 MED ORDER — ISOSORBIDE MONONITRATE ER 30 MG PO TB24: 30.0000 mg | ORAL_TABLET | Freq: Every day | ORAL | Status: DC

## 2013-12-01 MED ORDER — NITROGLYCERIN 0.4 MG SL SUBL: 0.4000 mg | SUBLINGUAL_TABLET | SUBLINGUAL | Status: DC | PRN

## 2013-12-01 MED ORDER — DIPHENHYDRAMINE HCL 25 MG PO CAPS
25.0000 mg | ORAL_CAPSULE | Freq: Four times a day (QID) | ORAL | Status: DC | PRN
  Administered 2013-12-01: 25 mg via ORAL
  Filled 2013-12-01 (×2): qty 1

## 2013-12-01 MED ORDER — ATORVASTATIN CALCIUM 80 MG PO TABS: 80.0000 mg | ORAL_TABLET | Freq: Every day | ORAL | Status: DC

## 2013-12-01 NOTE — Discharge Summary (Signed)
Discharge Summary   Patient ID: Shawn [name] MRN: [id number], DOB/AGE: November 20, 1935 78 y.o. Admit date: 11/29/2013 D/C date:     12/01/2013  Primary Cardiologist: Dr. Tamala Julian   Principal Problem:   Unstable angina Active Problems:   Atrial fibrillation   CAD (coronary artery disease), native coronary artery   Chronic anticoagulation   Essential hypertension   Hyperlipidemia   Admission Dates: 11/29/13-12/01/13 Discharge Diagnosis: [place] [facility] with no intervention and increased medical managment  HPI: Shawn [name] is a 78 y.o. male with a history of CAD, CABG with redo in 2004, PAF who presented to [facility] with unstable angina.   Mr. Brentin has documented coronary artery disease and in 1992 underwent initial CABG revascularization surgery by Dr. Redmond Pulling. He has undergone stenting of his left circumflex vessel in 2004 1 2005 and 2800 bare-metal stent placement vein graft to the LAD. In August 2011 he suffered a myocardial infarction and was treated at [facility] in [place] and had 2 additional stents placed in the vein graft to the LAD. In March 2012 . Repeat catheterization showed a widely patent. Left main, total LAD occlusion after a large branching septal perforator, diffuse disease in the mid circumflex with moderate in-stent restenosis in the RCA was occluded at the mid vessel. The vein to the LAD had multiple stents and there was 70% eccentric and 90% plus stenoses in the mid body of the graft. He underwent redo CABG surgery x2 on 05/02/2002 by Dr. Cyndia Bent , with a right internal mammary artery to the LAD, and a saphenous vein graft to the circumflex marginal vessel via endoscopic vein harvesting from the left leg. The patient also has a history of paroxysmal atrial fibrillation/flutter, which remotely. He has been on Coumadin therapy, and more recently, Eliquis, he has a remote history of bladder cancer in 2007, a history of vertebral osteomyelitis and discitis. He  status post bilateral hip replacement and has a history of cataract surgery.  He was last seen by Dr. Tamala Julian in May 2015 and was in normal sinus rhythm on Eliquis anticoagulation therapy.   The evening prior to admission, at approximately 3 AM he was awakened from sleep with chest and neck discomfort, which was of similar character as his previous heart pain. EMS arrived. He had taken 2 nitroglycerin and they also gave him aspirin. There was some question of whether or not. This was dyspepsia versus ischemia. At that time he decided not to come to the hospital in was planning to be evaluated in the office, but one hour later, again developed recurrent symptomatology. He presented to the emergency room for evaluation.    Hospital Course  Unstable angina  -- He underwent LHC yesterday which revealed   --Mild/moderate LV dysfunction with EF 40-45% and mild inferior hypocontractility.   --Significant native CAD with total occlusion of the LAD after the proximal septal perforating artery; 60-70% pLCx stenosis with evidence for mLCx marginal stent with mild- moderate intimal hyperplasia; total occlusion of the RCA with diffuse mid 70-90% stenoses and evidence for bridging collaterals to the distal RCA from a proximal branch.   --Patent RIMA graft from 2012 surgery supplying the LAD.   --Patent SVG from 2012 supplying the obtuse marginal vessel.   --1992 grafts with an atretic LIMA, which had supplied the marginal vessel, an occluded vein graft which had supplied the LAD, and an occluded SVG which had supplied the RCA.  -- Increased medical therapy recommended  -- Eliquis resumed  post cath  -- Continue atenolol 25mg , ASA, atorvastatin 80mg . He was started on Imdur 30mg    Ischemic cardiomyopathy- EF 40-45% and mild inferior hypocontractility by cath. EF 60% by LHC in 2012.  -- Consider starting an ACE as an outpatient if BP will allow. Continue BB.   PAF  -- Restarted Eliquis post cath  -- Now NSR    HLD- LDL 70. His statin was switched from simvastatin to atorvastatin 80 mg.    The patient has had an uncomplicated hospital course and is recovering well. The femoral catheter site is stable. He has been seen by Dr. Marlou Porch today and deemed ready for discharge home. All follow-up appointments have been scheduled. Discharge medications are listed below. He was started on Imdur 30mg     Discharge Vitals: Blood pressure 102/63, pulse 78, temperature 97.8 F (36.6 C), temperature source Oral, resp. rate 20, height 6\' 5"  (1.956 m), weight 205 lb 8 oz (93.214 kg), SpO2 95.00%.  Labs: Lab Results  Component Value Date   WBC 8.5 11/30/2013   HGB 14.3 11/30/2013   HCT 42.6 11/30/2013   MCV 92.0 11/30/2013   PLT 154 11/30/2013     Recent Labs Lab 11/29/13 1008 11/30/13 0400  NA 142 142  K 4.3 4.2  CL 103 104  CO2 30 27  BUN 17 19  CREATININE 0.86 0.90  CALCIUM 9.5 8.8  PROT 6.6  --   BILITOT 0.4  --   ALKPHOS 103  --   ALT 17  --   AST 19  --   GLUCOSE 118* 132*    Recent Labs  11/29/13 0852 11/29/13 1008 11/29/13 1600 11/29/13 2044  TROPONINI <0.30 <0.30 <0.30 <0.30   Lab Results  Component Value Date   CHOL 121 11/30/2013   HDL 33* 11/30/2013   LDLCALC 70 11/30/2013   TRIG 92 11/30/2013     Diagnostic Studies/Procedures   Dg Chest 2 View  11/29/2013   CLINICAL DATA:  Chest pain today, history of CABG.  EXAM: CHEST  2 VIEW  COMPARISON:  Chest radiograph May 27, 2010  FINDINGS: Cardiac silhouette is normal in size, mediastinal silhouette is not enlarged, status post median sternotomy for apparent coronary artery bypass grafting. Similar mild chronic interstitial changes with increased lung volumes, flattened hemidiaphragms. No pleural effusions or focal consolidations. No pneumothorax.  Straightened thoracic kyphosis mild degenerative spondylosis.  IMPRESSION: COPD, no acute cardiopulmonary process.     CARDIAC CATHETERIZATION  HISTORY:  Shawn [name]  is a 78 y.o. male Who has documented coronary artery disease and in 1992 underwent initial CABG revascularization surgery by Dr. Redmond Pulling with a LIMA graft to his circumflex, vein graft to his LAD, and vein graft to his RCA. He underwent stenting of his left circumflex vessel in 2004 and 2005 and in 2008 bare-metal stent placement vein graft to the LAD. In August 2011 he suffered a myocardial infarction and was treated at [facility] in [place] and had 2 additional stents placed in the vein graft to the LAD. In March 2012 . Repeat catheterization showed a widely patent left main, total LAD occlusion after a large branching septal perforator, diffuse disease in the mid circumflex with moderate in-stent restenosis and the RCA was occluded at the mid vessel. The vein to the LAD had multiple stents and there was 70% eccentric and 90% plus stenoses in the mid body of the graft. He underwent redo CABG surgery x2 on 05/02/2002 by Dr. Cyndia Bent , with a right internal  mammary artery to the LAD, and a saphenous vein graft to the circumflex marginal vessel via endoscopic vein harvesting from the left leg. The patient also has a history of paroxysmal atrial fibrillation/flutter, which remotely. He has been on Coumadin therapy, and more recently Eliquis. He developed chest pain awakening him from sleep which he felt was similar to his prior heart pain leading to his hospitalization. Eliquis has been held for approximately 40 hours and he presents for definitive cardiac catheterization.  PROCEDURE: Left heart catheterization: Coronary angiography into the native coronary arteries, Vein grafts, LIMA and RIMA coronary arteries; left ventriculography  The patient was brought to the [facility] cardiac catherization labaratory in the postabsorptive state. He was premedicated with Benadryl 25 mg since he did have an erythematous rash noted prior to procedure, in addition to Versed, 2 mg and fentanyl 25 mcg. His right  groin was prepped and shaved in usual sterile fashion. Xylocaine 1% was used for local anesthesia. A 5 French sheath was inserted into the R femoral artery. Diagnostic catheterizatiion was done with 5 [place] FL4, FR4, right bypass graft, LIMA, and pigtail catheters. Left ventriculography was done with 26 cc Omnipaque contrast. Hemostasis was obtained by direct manual compression. The patient tolerated the procedure well.  HEMODYNAMICS:  Central Aorta: 119/53  Left Ventricle: 119/15  ANGIOGRAPHY:  Left main: Angiographically normal vessel, which bifurcated into the LAD and left circumflex vessel.  LAD: Moderate size vessel that gave rise to a proximal septal perforating artery and small diagonal vessel. The LAD was then occluded.  Left circumflex: Moderate size vessel that had 60-70% proximal stenosis. In the mid and distal portion of the vessel was a stent with mild to moderate intimal hyperplasia and "flush and fill" was seen beyond the stented site  Right coronary artery: Medium sized vessel that had diffuse 70-90% mid-distal stenoses. Prior to being totally occluded. There was bridging collaterals supplying the RCA from the very proximal branch.  1992 surgery: Atretic LIMA graft which had supplied the circumflex marginal vessel  Occlusion of the vein graft which had supplied the LAD  Occluded vein graft, which had supplied the RCA  2012 surgery: Patent SVG graft supplying the distal circumflex marginal vessel  Patent RIMA graft supplying the LAD  Left ventriculography revealed mild-to-moderate LV dysfunction globally with an ejection fraction of 40-45%. There is mild distal inferior hypocontractility.  Total contrast used: 135 cc Omnipaque  IMPRESSION:  Mild/moderate LV dysfunction with an ejection fraction of 40-45% and mild inferior hypocontractility.  Significant native coronary obstructive disease with total occlusion of the LAD after the proximal septal perforating artery; 60-70% proximal  circumflex stenosis with evidence for mid circumflex marginal stent with mild- moderate intimal hyperplasia; total occlusion of the RCA with diffuse mid 70-90% stenoses and evidence for bridging collaterals to the distal RCA from a proximal branch.  Patent RIMA graft from 2012 surgery supplying the LAD.  Patent saphenous vein graft from 2012 supplying the obtuse marginal vessel.  1992 grafts with an atretic LIMA, which had supplied the marginal vessel, an occluded vein graft which had supplied the LAD, and an occluded SVG which had supplied the RCA.  RECOMMENDATION:  Increased medical therapy will be recommended.      Discharge Medications     Medication List    STOP taking these medications       simvastatin 80 MG tablet  Commonly known as:  ZOCOR      TAKE these medications       apixaban  5 MG Tabs tablet  Commonly known as:  ELIQUIS  take 1 tablet by mouth twice a day     aspirin 81 MG EC tablet  Take 1 tablet (81 mg total) by mouth daily.     atenolol 25 MG tablet  Commonly known as:  TENORMIN  Take 1 tablet by mouth daily.     atorvastatin 80 MG tablet  Commonly known as:  LIPITOR  Take 1 tablet (80 mg total) by mouth daily at 6 PM.     isosorbide mononitrate 30 MG 24 hr tablet  Commonly known as:  IMDUR  Take 1 tablet (30 mg total) by mouth daily.     nitroGLYCERIN 0.4 MG SL tablet  Commonly known as:  NITROSTAT  Place 1 tablet (0.4 mg total) under the tongue every 5 (five) minutes x 3 doses as needed for chest pain.        Disposition   The patient will be discharged in stable condition to home.  Follow-up Information   Follow up with Richardson Dopp, PA-C On 12/20/2013. (@ 2 pm )    Specialty:  Physician Assistant   Contact information:   [address] [address] [phone]         Duration of Discharge Encounter: Greater than 30 minutes including physician and PA time.  Mable Fill R PA-C 12/01/2013,  11:47 AM

## 2013-12-01 NOTE — Progress Notes (Signed)
Patient Name: Shawn [name] Date of Encounter: 12/01/2013     Active Problems:   Unstable angina    SUBJECTIVE  Feeling great. No further chest pain. Wants to go home.    CURRENT MEDS . apixaban  5 mg Oral BID  . aspirin EC  81 mg Oral Daily  . atenolol  25 mg Oral Daily  . atorvastatin  80 mg Oral q1800  . Influenza vac split quadrivalent PF  0.5 mL Intramuscular Tomorrow-1000  . isosorbide mononitrate  30 mg Oral Daily  .  morphine injection  1 mg Intravenous Once  . pantoprazole  40 mg Oral Daily    OBJECTIVE  Filed Vitals:   11/30/13 1643 11/30/13 2100 12/01/13 0526 12/01/13 0940  BP:  107/50 97/55 102/63  Pulse:  59 69 78  Temp:  98 F (36.7 C) 97.8 F (36.6 C)   TempSrc:   Oral   Resp:  18 20   Height:      Weight: 205 lb 8 oz (93.214 kg)     SpO2:  94% 95%     Intake/Output Summary (Last 24 hours) at 12/01/13 1012 Last data filed at 12/01/13 0851  Gross per 24 hour  Intake    240 ml  Output    675 ml  Net   -435 ml   Filed Weights   11/29/13 0557 11/29/13 1542 11/30/13 1643  Weight: 205 lb (92.987 kg) 193 lb 8 oz (87.771 kg) 205 lb 8 oz (93.214 kg)    PHYSICAL EXAM  General: Pleasant, NAD. Neuro: Alert and oriented X 3. Moves all extremities spontaneously. Psych: Normal affect. HEENT:  Normal  Neck: Supple without bruits or JVD. Lungs:  Resp regular and unlabored, CTA. Heart: RRR no s3, s4, or murmurs. Abdomen: Soft, non-tender, non-distended, BS + x 4.  Extremities: No clubbing, cyanosis or edema. DP/PT/Radials 2+ and equal bilaterally.  Accessory Clinical Findings  CBC  Recent Labs  11/29/13 1008 11/30/13 0400  WBC 4.8 8.5  NEUTROABS 3.3  --   HGB 13.6 14.3  HCT 41.3 42.6  MCV 93.4 92.0  PLT 135* 947   Basic Metabolic Panel  Recent Labs  11/29/13 1008 11/30/13 0400  NA 142 142  K 4.3 4.2  CL 103 104  CO2 30 27  GLUCOSE 118* 132*  BUN 17 19  CREATININE 0.86 0.90  CALCIUM 9.5 8.8   Liver Function  Tests  Recent Labs  11/29/13 1008  AST 19  ALT 17  ALKPHOS 103  BILITOT 0.4  PROT 6.6  ALBUMIN 3.3*    Cardiac Enzymes  Recent Labs  11/29/13 1008 11/29/13 1600 11/29/13 2044  TROPONINI <0.30 <0.30 <0.30   Fasting Lipid Panel  Recent Labs  11/30/13 0400  CHOL 121  HDL 33*  LDLCALC 70  TRIG 92  CHOLHDL 3.7   Thyroid Function Tests  Recent Labs  11/29/13 1008  TSH 1.120    TELE  NSR with some bradycardia  Radiology/Studies  Dg Chest 2 View  11/29/2013   CLINICAL DATA:  Chest pain today, history of CABG.  EXAM: CHEST  2 VIEW  COMPARISON:  Chest radiograph May 27, 2010  FINDINGS: Cardiac silhouette is normal in size, mediastinal silhouette is not enlarged, status post median sternotomy for apparent coronary artery bypass grafting. Similar mild chronic interstitial changes with increased lung volumes, flattened hemidiaphragms. No pleural effusions or focal consolidations. No pneumothorax.  Straightened thoracic kyphosis mild degenerative spondylosis.  IMPRESSION: COPD, no acute cardiopulmonary process.  Electronically Signed   By: Elon Alas   On: 11/29/2013 06:51       CARDIAC CATHETERIZATION  HISTORY:  Shawn [name] is a 78 y.o. male Who has documented coronary artery disease and in 1992 underwent initial CABG revascularization surgery by Dr. Redmond Pulling with a LIMA graft to his circumflex, vein graft to his LAD, and vein graft to his RCA. He underwent stenting of his left circumflex vessel in 2004 and 2005 and in 2008 bare-metal stent placement vein graft to the LAD. In August 2011 he suffered a myocardial infarction and was treated at [facility] in [place] and had 2 additional stents placed in the vein graft to the LAD. In March 2012 . Repeat catheterization showed a widely patent left main, total LAD occlusion after a large branching septal perforator, diffuse disease in the mid circumflex with moderate in-stent restenosis and the RCA  was occluded at the mid vessel. The vein to the LAD had multiple stents and there was 70% eccentric and 90% plus stenoses in the mid body of the graft. He underwent redo CABG surgery x2 on 05/02/2002 by Dr. Cyndia Bent , with a right internal mammary artery to the LAD, and a saphenous vein graft to the circumflex marginal vessel via endoscopic vein harvesting from the left leg. The patient also has a history of paroxysmal atrial fibrillation/flutter, which remotely. He has been on Coumadin therapy, and more recently Eliquis. He developed chest pain awakening him from sleep which he felt was similar to his prior heart pain leading to his hospitalization. Eliquis has been held for approximately 40 hours and he presents for definitive cardiac catheterization.  PROCEDURE: Left heart catheterization: Coronary angiography into the native coronary arteries, Vein grafts, LIMA and RIMA coronary arteries; left ventriculography  The patient was brought to the [facility] cardiac catherization labaratory in the postabsorptive state. He was premedicated with Benadryl 25 mg since he did have an erythematous rash noted prior to procedure, in addition to Versed, 2 mg and fentanyl 25 mcg. His right groin was prepped and shaved in usual sterile fashion. Xylocaine 1% was used for local anesthesia. A 5 French sheath was inserted into the R femoral artery. Diagnostic catheterizatiion was done with 5 [place] FL4, FR4, right bypass graft, LIMA, and pigtail catheters. Left ventriculography was done with 26 cc Omnipaque contrast. Hemostasis was obtained by direct manual compression. The patient tolerated the procedure well.  HEMODYNAMICS:  Central Aorta: 119/53  Left Ventricle: 119/15  ANGIOGRAPHY:  Left main: Angiographically normal vessel, which bifurcated into the LAD and left circumflex vessel.  LAD: Moderate size vessel that gave rise to a proximal septal perforating artery and small diagonal vessel. The LAD was then occluded.   Left circumflex: Moderate size vessel that had 60-70% proximal stenosis. In the mid and distal portion of the vessel was a stent with mild to moderate intimal hyperplasia and "flush and fill" was seen beyond the stented site  Right coronary artery: Medium sized vessel that had diffuse 70-90% mid-distal stenoses. Prior to being totally occluded. There was bridging collaterals supplying the RCA from the very proximal branch.  1992 surgery: Atretic LIMA graft which had supplied the circumflex marginal vessel  Occlusion of the vein graft which had supplied the LAD  Occluded vein graft, which had supplied the RCA  2012 surgery: Patent SVG graft supplying the distal circumflex marginal vessel  Patent RIMA graft supplying the LAD  Left ventriculography revealed mild-to-moderate LV dysfunction globally with an ejection fraction of  40-45%. There is mild distal inferior hypocontractility.  Total contrast used: 135 cc Omnipaque  IMPRESSION:  Mild/moderate LV dysfunction with an ejection fraction of 40-45% and mild inferior hypocontractility.  Significant native coronary obstructive disease with total occlusion of the LAD after the proximal septal perforating artery; 60-70% proximal circumflex stenosis with evidence for mid circumflex marginal stent with mild- moderate intimal hyperplasia; total occlusion of the RCA with diffuse mid 70-90% stenoses and evidence for bridging collaterals to the distal RCA from a proximal branch.  Patent RIMA graft from 2012 surgery supplying the LAD.  Patent saphenous vein graft from 2012 supplying the obtuse marginal vessel.  1992 grafts with an atretic LIMA, which had supplied the marginal vessel, an occluded vein graft which had supplied the LAD, and an occluded SVG which had supplied the RCA.  RECOMMENDATION:  Increased medical therapy will be recommended.     ASSESSMENT AND PLAN  Shawn [name] is a 78 y.o. male with a history of CAD, CABG with redo in 2004, PAF  who presented to [facility] with unstable angina.   Unstable angina  -- He underwent LHC yesterday which revealed   --Mild/moderate LV dysfunction with EF 40-45% and mild inferior hypocontractility.   --Significant native CAD with total occlusion of the LAD after the proximal septal perforating artery; 60-70% pLCx stenosis with evidence for mLCx marginal stent with mild- moderate intimal hyperplasia; total occlusion of the RCA with diffuse mid 70-90% stenoses and evidence for bridging collaterals to the distal RCA from a proximal branch.   --Patent RIMA graft from 2012 surgery supplying the LAD.   --Patent SVG from 2012 supplying the obtuse marginal vessel.   --1992 grafts with an atretic LIMA, which had supplied the marginal vessel, an occluded vein graft which had supplied the LAD, and an occluded SVG which had supplied the RCA.  -- Increased medical therapy recommended -- Eliquis resumed post cath  -- Continue atenolol 25mg , ASA, atorvastatin 80mg . He was started on Imdur 30mg   Ischemic cardiomyopathy- EF 40-45% and mild inferior hypocontractility by cath. EF 60% by LHC in 2012. -- Consider starting an ACE as an outpatient if BP will allow. Continue BB.   PAF  -- Restarted Eliquis post cath  -- Now NSR  HLD- LDL 70.  Continue statin   Signed, Eileen Stanford PA-C  Pager [phone]  Personally seen and examined. Agree with above. I had lengthy discussion with him about cath, treatment plan.  Candee Furbish, MD

## 2013-12-02 ENCOUNTER — Other Ambulatory Visit: Payer: Self-pay

## 2013-12-02 NOTE — Care Management Note (Signed)
    Page 1 of 1   12/02/2013     8:52:34 AM CARE MANAGEMENT NOTE 12/02/2013  Patient:  Shawn [name], Shawn [name]   Account Number:  [id number]  Date Initiated:  12/01/2013  Documentation initiated by:  Marvetta Gibbons  Subjective/Objective Assessment:   Pt admitted with [place]     Action/Plan:   PTA pt lived at home- plan to return home   Anticipated DC Date:  12/01/2013   Anticipated DC Plan:  HOME/SELF CARE         Choice offered to / List presented to:             Status of service:  Completed, signed off Medicare Important Message given?  NA - LOS <3 / Initial given by admissions (If response is "NO", the following Medicare IM given date fields will be blank) Date Medicare IM given:   Medicare IM given by:   Date Additional Medicare IM given:   Additional Medicare IM given by:    Discharge Disposition:  HOME/SELF CARE  Per UR Regulation:  Reviewed for med. necessity/level of care/duration of stay  If discussed at [place] of Stay Meetings, dates discussed:    Comments:

## 2013-12-02 NOTE — Discharge Summary (Signed)
Personally seen and examined. Agree with above. Antianginal therapy.  Candee Furbish, MD

## 2013-12-04 NOTE — ED Provider Notes (Signed)
Medical screening examination/treatment/procedure(s) were conducted as a shared visit with non-physician practitioner(s) and myself.  I personally evaluated the patient during the encounter.   EKG Interpretation   Date/Time:  Tuesday November 29 2013 06:00:08 EDT Ventricular Rate:  61 PR Interval:  206 QRS Duration: 103 QT Interval:  407 QTC Calculation: 410 R Axis:   93 Text Interpretation:  Sinus rhythm Anteroseptal infarct, age indeterminate  Baseline wander in lead(s) V4 Confirmed by Seanpatrick Maisano  MD, Arham Symmonds ([id number]) on  11/29/2013 7:24:59 AM     77yM with CP. Some concerning features. Known CAD. Will discuss with cardiology.   Virgel Manifold, MD 12/04/13 [phone]

## 2013-12-05 ENCOUNTER — Telehealth: Payer: Self-pay | Admitting: Interventional Cardiology

## 2013-12-05 NOTE — Telephone Encounter (Signed)
New message  Pt requests to see Dr. Tamala Julian Vs the PA. Declined to see PA// Pt reports he has disturbing questions to ask Dr. Tamala Julian only// No further details.. Please call back

## 2013-12-06 NOTE — Telephone Encounter (Signed)
Returned pt call. Pt rqst he be scheduled with S.Weaver, PA a day that Dr.Smith is in the office. Pt appt rescheduled for 11/5 @11 :50an. Pt aware and verbalized understanding.

## 2013-12-06 NOTE — Telephone Encounter (Signed)
F/u    Pt waiting on call back from nurse.

## 2013-12-08 ENCOUNTER — Encounter: Payer: Self-pay | Admitting: Cardiovascular Disease

## 2013-12-20 ENCOUNTER — Encounter: Payer: Medicare Other | Admitting: Physician Assistant

## 2013-12-22 ENCOUNTER — Encounter: Payer: Medicare Other | Admitting: Physician Assistant

## 2013-12-26 ENCOUNTER — Other Ambulatory Visit: Payer: Medicare Other

## 2013-12-27 ENCOUNTER — Ambulatory Visit (INDEPENDENT_AMBULATORY_CARE_PROVIDER_SITE_OTHER): Payer: Medicare Other | Admitting: Physician Assistant

## 2013-12-27 ENCOUNTER — Other Ambulatory Visit (INDEPENDENT_AMBULATORY_CARE_PROVIDER_SITE_OTHER): Payer: Medicare Other

## 2013-12-27 ENCOUNTER — Encounter: Payer: Self-pay | Admitting: Physician Assistant

## 2013-12-27 VITALS — BP 126/64 | HR 54 | Ht 76.0 in | Wt 196.0 lb

## 2013-12-27 DIAGNOSIS — I255 Ischemic cardiomyopathy: Secondary | ICD-10-CM

## 2013-12-27 DIAGNOSIS — I25812 Atherosclerosis of bypass graft of coronary artery of transplanted heart without angina pectoris: Secondary | ICD-10-CM

## 2013-12-27 DIAGNOSIS — I480 Paroxysmal atrial fibrillation: Secondary | ICD-10-CM | POA: Diagnosis not present

## 2013-12-27 DIAGNOSIS — I10 Essential (primary) hypertension: Secondary | ICD-10-CM

## 2013-12-27 DIAGNOSIS — I4891 Unspecified atrial fibrillation: Secondary | ICD-10-CM

## 2013-12-27 LAB — CREATININE, SERUM: CREATININE: 0.9 mg/dL (ref 0.4–1.5)

## 2013-12-27 LAB — HEMOGLOBIN: HEMOGLOBIN: 14 g/dL (ref 13.0–17.0)

## 2013-12-27 MED ORDER — LISINOPRIL 2.5 MG PO TABS: 2.5000 mg | ORAL_TABLET | Freq: Every day | ORAL | Status: DC

## 2013-12-27 NOTE — Assessment & Plan Note (Signed)
Controlled rate on atenolol. Also on Eliquis.

## 2013-12-27 NOTE — Assessment & Plan Note (Signed)
See above dictation for complete cath details. Patient had an admission with unstable angina. He feels quite well now on Imdur. He has new ischemic cardiomyopathy EF 45-50%. Will add low-dose lisinopril. Followup with Dr. Tamala Julian in January.

## 2013-12-27 NOTE — Progress Notes (Signed)
Primary Cardiologist: Dr. Tamala Julian     [facility]:Shawn [name] is a  78 year old male patient Dr. Tamala Julian with coronary artery disease,  paroxysmal atrial fibrillation on Eliquis who presented to the hospital with unstable angina. He has history of [place] with redo in 2004.he had stenting of the left circumflex in 2004, 2005, and a bare-metal stent placed to the SVG to the LAD in 2008. He had an MI in 2011 treated with 2 stents in the SVG to the LAD.   Most recent heart catheterization on 11/29/13 showed mild to moderate LV dysfunction EF 40-45% with mild inferior hypokinesis. He had a total LAD, 60-70% circumflex with evidence for circumflex marginal stent with mild to moderate intimal hyperplasia, total RCA with diffuse MID 70-90% stenosis and evidence for bridging collaterals to the distal RCA from a proximal branch. The RIMA to the LAD was patent, patent SVG to the OM in 1992 grafts within the atria to clean which had supplied the marginal vessel, an occluded vein graft which had supplied the LAD and an occluded SVG which supply the RCA. Increase medical therapy was recommended. He was started on them door. Was recommended that an ACE inhibitor be started as an outpatient if BP allows.  Patient comes in today feeling quite well. He has had no further chest pain. He is wondering if he can start walking again and played golf. He has been back and forth to the [place] several times without difficulty.   Allergies  Allergen Reactions  . Penicillins Rash     Current Outpatient Prescriptions  Medication Sig Dispense Refill  . apixaban (ELIQUIS) 5 MG TABS tablet take 1 tablet by mouth twice a day 60 tablet 5  . aspirin EC 81 MG EC tablet Take 1 tablet (81 mg total) by mouth daily.    Marland Kitchen atenolol (TENORMIN) 25 MG tablet Take 1 tablet by mouth daily.    Marland Kitchen atorvastatin (LIPITOR) 80 MG tablet Take 1 tablet (80 mg total) by mouth daily at 6 PM. 30 tablet 11  . isosorbide mononitrate (IMDUR)  30 MG 24 hr tablet Take 1 tablet (30 mg total) by mouth daily. 30 tablet 11  . nitroGLYCERIN (NITROSTAT) 0.4 MG SL tablet Place 1 tablet (0.4 mg total) under the tongue every 5 (five) minutes x 3 doses as needed for chest pain. 25 tablet 12   No current facility-administered medications for this visit.    Past Medical History  Diagnosis Date  . Anginal pain   . Myocardial infarction 11/1990  . Anterior myocardial infarction 09/2009    [name] 04/22/2010  . Coronary artery disease   . Arthritis     "knees" (11/29/2013)    Past Surgical History  Procedure Laterality Date  . Tonsillectomy  1940's  . Appendectomy  1940's  . Total hip arthroplasty Left 1992  . Total hip arthroplasty Right ~ 1994  . Cataract extraction w/ intraocular lens  implant, bilateral Bilateral 1990's  . Joint replacement    . Coronary artery bypass graft  1992; ~2012    "CABG X3; CABH X 3"  . Coronary angioplasty with stent placement  "several since 1992"  . Cardiac catheterization  09/2006    [name] 06/20/2010    Family History  Problem Relation Age of Onset  . Heart disease Father     History   Social History  . Marital Status: Married    Spouse Name: N/A    Number of Children: N/A  . Years of  Education: N/A   Occupational History  . Not on file.   Social History Main Topics  . Smoking status: Light Tobacco Smoker -- 58 years    Types: Cigars, Cigarettes    Start date: 02/17/1973  . Smokeless tobacco: Never Used     Comment: 11/29/2013 "I smoke 1 cigar/day now; stopped smoking cigarettes"  . Alcohol Use: No  . Drug Use: No  . Sexual Activity: Yes   Other Topics Concern  . Not on file   Social History Narrative  . No narrative on file    ROS:see history of present illness otherwise negative  BP 126/64 mmHg  Pulse 54  Ht 6\' 4"  (1.93 m)  Wt 196 lb (88.905 kg)  BMI 23.87 kg/m2  SpO2 98%  PHYSICAL EXAM: Well-nournished, in no acute distress. Neck:bruit versus murmur portrayed in  carotids, No JVD, HJR, or thyroid enlargement  Lungs: No tachypnea, clear without wheezing, rales, or rhonchi  Cardiovascular: RRR, PMI not displaced, Normal S1 and S2, 2/6 systolic murmur at the left sternal border, no gallops, bruit, thrill, or heave.  Abdomen: BS normal. Soft without organomegaly, masses, lesions or tenderness.  Extremities: right groin without hematoma or hemorrhage at cath site, otherwise lower extremities without cyanosis, clubbing or edema. Good distal pulses bilateral  SKin: Warm, no lesions or rashes   Musculoskeletal: No deformities  Neuro: no focal signs   Wt Readings from Last 3 Encounters:  11/30/13 205 lb 8 oz (93.214 kg)  06/27/13 200 lb (90.719 kg)    Lab Results  Component Value Date   WBC 8.5 11/30/2013   HGB 14.3 11/30/2013   HCT 42.6 11/30/2013   PLT 154 11/30/2013   GLUCOSE 132* 11/30/2013   CHOL 121 11/30/2013   TRIG 92 11/30/2013   HDL 33* 11/30/2013   LDLCALC 70 11/30/2013   ALT 17 11/29/2013   AST 19 11/29/2013   NA 142 11/30/2013   K 4.2 11/30/2013   CL 104 11/30/2013   CREATININE 0.90 11/30/2013   BUN 19 11/30/2013   CO2 27 11/30/2013   TSH 1.120 11/29/2013   INR 1.16 11/29/2013   HGBA1C * 04/30/2010    6.8 (NOTE)                                                                       According to the ADA Clinical Practice Recommendations for 2011, when HbA1c is used as a screening test:   >=6.5%   Diagnostic of Diabetes Mellitus           (if abnormal result  is confirmed)  5.7-6.4%   Increased risk of developing Diabetes Mellitus  References:Diagnosis and Classification of Diabetes Mellitus,Diabetes YTKP,5465,68(LEXNT 1):S62-S69 and Standards of Medical Care in         Diabetes - 2011,Diabetes Care,2011,34  (Suppl 1):S11-S61.    EKG:not performed   IMPRESSION:   Mild/moderate LV dysfunction with an ejection fraction of 40-45% and mild inferior hypocontractility.   Significant native coronary obstructive disease with  total occlusion of the LAD after the proximal septal perforating artery; 60-70% proximal circumflex stenosis with evidence for mid circumflex marginal stent with mild- moderate intimal hyperplasia; total occlusion of the RCA with diffuse mid 70-90% stenoses and evidence for bridging collaterals to  the distal RCA from a proximal branch.   Patent RIMA graft from 2012 surgery supplying the LAD.   Patent saphenous vein graft from 2012 supplying the obtuse marginal vessel.   1992 grafts with an atretic LIMA, which had supplied the marginal vessel, an occluded vein graft which had supplied the LAD, and an occluded SVG which had supplied the RCA.   RECOMMENDATION:  Increased medical therapy will be recommended.

## 2013-12-27 NOTE — Assessment & Plan Note (Signed)
EF 40-45%. We'll begin low-dose lisinopril 2.5 mg daily. Followup with Dr. Tamala Julian in January. Blood work done today.

## 2013-12-27 NOTE — Assessment & Plan Note (Addendum)
Blood pressure stable ? ?

## 2013-12-27 NOTE — Patient Instructions (Signed)
Your physician has recommended you make the following change in your medication:  1) START Lisinopril 2.5mg  daily. An Rx has been sent to your pharmacy  You have a follow up appointment scheduled on 02/28/13 # 8am with Dr.Smith

## 2013-12-29 ENCOUNTER — Telehealth: Payer: Self-pay

## 2013-12-29 DIAGNOSIS — Z7901 Long term (current) use of anticoagulants: Secondary | ICD-10-CM

## 2013-12-29 NOTE — Telephone Encounter (Signed)
Pt aware of lab results with verbal understanding.Normal labs on NOAC. Repeat in 6-9 months.

## 2013-12-29 NOTE — Telephone Encounter (Signed)
returned pt call. lmtcb 

## 2013-12-29 NOTE — Telephone Encounter (Signed)
-----   Message from Sinclair Grooms, MD sent at 12/28/2013  6:40 PM EST ----- Normal labs on NOAC. Repeat in 6-9 months.

## 2013-12-29 NOTE — Telephone Encounter (Signed)
Follow up  ° ° ° °Returning call back to nurse  °

## 2013-12-29 NOTE — Telephone Encounter (Signed)
called to give pt lab results.lmtcb

## 2014-01-26 ENCOUNTER — Encounter (HOSPITAL_COMMUNITY): Payer: Self-pay | Admitting: Cardiovascular Disease

## 2014-02-06 DIAGNOSIS — Z0000 Encounter for general adult medical examination without abnormal findings: Secondary | ICD-10-CM | POA: Diagnosis not present

## 2014-02-06 DIAGNOSIS — H6123 Impacted cerumen, bilateral: Secondary | ICD-10-CM | POA: Diagnosis not present

## 2014-02-06 DIAGNOSIS — I480 Paroxysmal atrial fibrillation: Secondary | ICD-10-CM | POA: Diagnosis not present

## 2014-02-06 DIAGNOSIS — I2510 Atherosclerotic heart disease of native coronary artery without angina pectoris: Secondary | ICD-10-CM | POA: Diagnosis not present

## 2014-02-06 DIAGNOSIS — Z23 Encounter for immunization: Secondary | ICD-10-CM | POA: Diagnosis not present

## 2014-02-06 DIAGNOSIS — E785 Hyperlipidemia, unspecified: Secondary | ICD-10-CM | POA: Diagnosis not present

## 2014-02-06 DIAGNOSIS — I10 Essential (primary) hypertension: Secondary | ICD-10-CM | POA: Diagnosis not present

## 2014-02-28 ENCOUNTER — Ambulatory Visit (INDEPENDENT_AMBULATORY_CARE_PROVIDER_SITE_OTHER): Payer: Medicare Other | Admitting: Interventional Cardiology

## 2014-02-28 ENCOUNTER — Encounter: Payer: Self-pay | Admitting: Interventional Cardiology

## 2014-02-28 VITALS — BP 118/58 | HR 64 | Ht 77.0 in | Wt 200.0 lb

## 2014-02-28 DIAGNOSIS — Z7901 Long term (current) use of anticoagulants: Secondary | ICD-10-CM | POA: Diagnosis not present

## 2014-02-28 DIAGNOSIS — I10 Essential (primary) hypertension: Secondary | ICD-10-CM

## 2014-02-28 DIAGNOSIS — I25812 Atherosclerosis of bypass graft of coronary artery of transplanted heart without angina pectoris: Secondary | ICD-10-CM

## 2014-02-28 DIAGNOSIS — I480 Paroxysmal atrial fibrillation: Secondary | ICD-10-CM | POA: Diagnosis not present

## 2014-02-28 DIAGNOSIS — E785 Hyperlipidemia, unspecified: Secondary | ICD-10-CM | POA: Diagnosis not present

## 2014-02-28 NOTE — Patient Instructions (Signed)
Your physician has recommended you make the following change in your medication:  1.) STOP ASPIRIN  PLEASE CALL TO INFORM DR SMITH IF YOU HAVE ANY CHEST PAIN FOR WHICH YOU USE NITROGLYCERIN.  Your physician wants you to follow-up in: [place].  You will receive a reminder letter in the mail two months in advance. If you don't receive a letter, please call our office to schedule the follow-up appointment.

## 2014-02-28 NOTE — Progress Notes (Signed)
Patient ID: Shawn [name], male   DOB: [birth year], 79 y.o.   MRN: [id number]    [address], [address] [place], [place]  [address] Phone: [phone] Fax:  [phone]  Date:  02/28/2014   ID:  Shawn [name], DOB 11-10-1935, MRN [id number]  PCP:  Dorian Heckle, MD   ASSESSMENT:  1. CAD with bypass graft disease and no recurrent 2. Atrial fibrillation, with rate control. 3. Chronic anticoagulation therapy with apixaban, no complications. Aspirin was recently added 4. Patent RIMA to LAD and saphenous vein graft to circumflex. Totally occluded distal right coronary with bridging collaterals.  PLAN:  1. Continue medical therapy 2. Discontinue aspirin 3. Continue isosorbide 4. Resume typical activity call If angina 5. Clinical follow-up in 6 months or earlier if any angina.   SUBJECTIVE: Shawn [name] is a 79 y.o. male who developed angina in October. He was eventually hospitalized and underwent angiography. He was documented to have a totally occluded distal right coronary with left-to-right collaterals. The RIMA to the LAD and SVG to OM were widely patent. Medical therapy was uptitrated. No enzyme bump to suggest myocardial injury occurred during the hospital stay. He has been asymptomatic since that time. No blood in his urine or stool.   Wt Readings from Last 3 Encounters:  02/28/14 200 lb (90.719 kg)  12/27/13 196 lb (88.905 kg)  11/30/13 205 lb 8 oz (93.214 kg)     Past Medical History  Diagnosis Date  . Anginal pain   . Myocardial infarction 11/1990  . Anterior myocardial infarction 09/2009    [name] 04/22/2010  . Coronary artery disease   . Arthritis     "knees" (11/29/2013)    Current Outpatient Prescriptions  Medication Sig Dispense Refill  . apixaban (ELIQUIS) 5 MG TABS tablet take 1 tablet by mouth twice a day 60 tablet 5  . atenolol (TENORMIN) 25 MG tablet Take 1 tablet by mouth daily.    Marland Kitchen atorvastatin (LIPITOR) 80 MG tablet Take 1 tablet  (80 mg total) by mouth daily at 6 PM. 30 tablet 11  . isosorbide mononitrate (IMDUR) 30 MG 24 hr tablet Take 1 tablet (30 mg total) by mouth daily. 30 tablet 11  . lisinopril (PRINIVIL,ZESTRIL) 2.5 MG tablet Take 1 tablet (2.5 mg total) by mouth daily. 30 tablet 5  . nitroGLYCERIN (NITROSTAT) 0.4 MG SL tablet Place 1 tablet (0.4 mg total) under the tongue every 5 (five) minutes x 3 doses as needed for chest pain. 25 tablet 12   No current facility-administered medications for this visit.    Allergies:    Allergies  Allergen Reactions  . Penicillins Rash    Social History:  The patient  reports that he has been smoking Cigars and Cigarettes.  He started smoking about 41 years ago. He has been smoking about 0.00 packs per day for the past 58 years. He has never used smokeless tobacco. He reports that he does not drink alcohol or use illicit drugs.   ROS:  Please see the history of present illness.   Without blood in urine or stool.   All other systems reviewed and negative.   OBJECTIVE: VS:  BP 118/58 mmHg  Pulse 64  Ht 6\' 5"  (1.956 m)  Wt 200 lb (90.719 kg)  BMI 23.71 kg/m2 Well nourished, well developed, in no acute distress, slender  HEENT: normal Neck: JVD flat. Carotid bruit absent  Cardiac:  normal S1, S2; RRR; no murmur Lungs:  clear to auscultation  bilaterally, no wheezing, rhonchi or rales Abd: soft, nontender, no hepatomegaly Ext: Edema absent. Pulses 2+ Skin: warm and dry Neuro:  CNs 2-12 intact, no focal abnormalities noted  EKG:  Not repeated       Signed, Illene Labrador III, MD 02/28/2014 8:43 AM

## 2014-03-29 DIAGNOSIS — M1711 Unilateral primary osteoarthritis, right knee: Secondary | ICD-10-CM | POA: Diagnosis not present

## 2014-04-05 DIAGNOSIS — M1711 Unilateral primary osteoarthritis, right knee: Secondary | ICD-10-CM | POA: Diagnosis not present

## 2014-04-12 DIAGNOSIS — M1712 Unilateral primary osteoarthritis, left knee: Secondary | ICD-10-CM | POA: Diagnosis not present

## 2014-04-12 DIAGNOSIS — M1711 Unilateral primary osteoarthritis, right knee: Secondary | ICD-10-CM | POA: Diagnosis not present

## 2014-05-04 DIAGNOSIS — Z8551 Personal history of malignant neoplasm of bladder: Secondary | ICD-10-CM | POA: Diagnosis not present

## 2014-05-16 ENCOUNTER — Other Ambulatory Visit: Payer: Self-pay

## 2014-05-16 MED ORDER — APIXABAN 5 MG PO TABS: ORAL_TABLET | ORAL | Status: DC

## 2014-06-23 ENCOUNTER — Other Ambulatory Visit: Payer: Self-pay

## 2014-06-23 MED ORDER — LISINOPRIL 2.5 MG PO TABS: 2.5000 mg | ORAL_TABLET | Freq: Every day | ORAL | Status: DC

## 2014-06-27 ENCOUNTER — Other Ambulatory Visit (INDEPENDENT_AMBULATORY_CARE_PROVIDER_SITE_OTHER): Payer: Medicare Other | Admitting: *Deleted

## 2014-06-27 DIAGNOSIS — Z7901 Long term (current) use of anticoagulants: Secondary | ICD-10-CM

## 2014-06-27 LAB — BASIC METABOLIC PANEL
BUN: 18 mg/dL (ref 6–23)
CHLORIDE: 105 meq/L (ref 96–112)
CO2: 32 meq/L (ref 19–32)
Calcium: 9.1 mg/dL (ref 8.4–10.5)
Creatinine, Ser: 0.92 mg/dL (ref 0.40–1.50)
GFR: 84.89 mL/min (ref >60.00)
Glucose, Bld: 183 mg/dL — ABNORMAL HIGH (ref 70–99)
POTASSIUM: 4.4 meq/L (ref 3.5–5.1)
Sodium: 139 mEq/L (ref 135–145)

## 2014-06-27 LAB — CBC WITH DIFFERENTIAL/PLATELET
BASOS PCT: 0.5 % (ref 0.0–3.0)
Basophils Absolute: 0 10*3/uL (ref 0.0–0.1)
EOS ABS: 0.2 10*3/uL (ref 0.0–0.7)
EOS PCT: 3.2 % (ref 0.0–5.0)
HCT: 38.8 % — ABNORMAL LOW (ref 39.0–52.0)
Hemoglobin: 13.2 g/dL (ref 13.0–17.0)
Lymphocytes Relative: 15.9 % (ref 12.0–46.0)
Lymphs Abs: 1.1 10*3/uL (ref 0.7–4.0)
MCHC: 34 g/dL (ref 30.0–36.0)
MCV: 93.4 fl (ref 78.0–100.0)
Monocytes Absolute: 0.5 10*3/uL (ref 0.1–1.0)
Monocytes Relative: 6.9 % (ref 3.0–12.0)
Neutro Abs: 4.9 10*3/uL (ref 1.4–7.7)
Neutrophils Relative %: 73.5 % (ref 43.0–77.0)
Platelets: 182 10*3/uL (ref 150.0–400.0)
RBC: 4.15 Mil/uL — ABNORMAL LOW (ref 4.22–5.81)
RDW: 15.5 % (ref 11.5–15.5)
WBC: 6.6 10*3/uL (ref 4.0–10.5)

## 2014-07-31 ENCOUNTER — Ambulatory Visit (INDEPENDENT_AMBULATORY_CARE_PROVIDER_SITE_OTHER): Payer: Medicare Other | Admitting: Interventional Cardiology

## 2014-07-31 ENCOUNTER — Encounter: Payer: Self-pay | Admitting: Interventional Cardiology

## 2014-07-31 ENCOUNTER — Telehealth: Payer: Self-pay | Admitting: Interventional Cardiology

## 2014-07-31 VITALS — BP 110/52 | HR 60 | Ht 77.0 in | Wt 188.6 lb

## 2014-07-31 DIAGNOSIS — E785 Hyperlipidemia, unspecified: Secondary | ICD-10-CM

## 2014-07-31 DIAGNOSIS — I482 Chronic atrial fibrillation, unspecified: Secondary | ICD-10-CM

## 2014-07-31 DIAGNOSIS — I10 Essential (primary) hypertension: Secondary | ICD-10-CM | POA: Diagnosis not present

## 2014-07-31 DIAGNOSIS — I25812 Atherosclerosis of bypass graft of coronary artery of transplanted heart without angina pectoris: Secondary | ICD-10-CM | POA: Diagnosis not present

## 2014-07-31 DIAGNOSIS — Z7901 Long term (current) use of anticoagulants: Secondary | ICD-10-CM | POA: Diagnosis not present

## 2014-07-31 NOTE — Progress Notes (Signed)
Cardiology Office Note   Date:  07/31/2014   ID:  Shawn [name], DOB 1935-08-15, MRN [id number]  PCP:  Dorian Heckle, MD  Cardiologist:  Sinclair Grooms, MD   Chief Complaint  Patient presents with  . Follow-up    Paroxysmal atrial fib      History of Present Illness: Shawn [name] is a 79 y.o. male who presents for CAD, chronic atrial fibrillation, chronic anticoagulation therapy, prior history of coronary bypass grafting and hyperlipidemia.  Shawn [name] is doing well. He denies angina. Recently he visited [place] and developed bilateral lower extremity swelling. Since returning home this is completely resolved. There was no associated dyspnea. He has not required nitroglycerin. He specifically denies any medication side effects. He has not noticed blood in the urine or stool.    Past Medical History  Diagnosis Date  . Anginal pain   . Myocardial infarction 11/1990  . Anterior myocardial infarction 09/2009    [name] 04/22/2010  . Coronary artery disease   . Arthritis     "knees" (11/29/2013)    Past Surgical History  Procedure Laterality Date  . Tonsillectomy  1940's  . Appendectomy  1940's  . Total hip arthroplasty Left 1992  . Total hip arthroplasty Right ~ 1994  . Cataract extraction w/ intraocular lens  implant, bilateral Bilateral 1990's  . Joint replacement    . Coronary artery bypass graft  1992; ~2012    "CABG X3; CABH X 3"  . Coronary angioplasty with stent placement  "several since 1992"  . Cardiac catheterization  09/2006    [name] 06/20/2010  . Left heart catheterization with coronary/graft angiogram N/A 11/30/2013    Procedure: LEFT HEART CATHETERIZATION WITH Beatrix Fetters;  Surgeon: Troy Sine, MD;  Location: [facility] C[facility] LAB;  Service: Cardiovascular;  Laterality: N/A;     Current Outpatient Prescriptions  Medication Sig Dispense Refill  . apixaban (ELIQUIS) 5 MG TABS tablet take 1 tablet by mouth twice a day 60 tablet 5  .  atenolol (TENORMIN) 25 MG tablet Take 1 tablet by mouth daily.    Marland Kitchen atorvastatin (LIPITOR) 80 MG tablet Take 1 tablet (80 mg total) by mouth daily at 6 PM. 30 tablet 11  . isosorbide mononitrate (IMDUR) 30 MG 24 hr tablet Take 1 tablet (30 mg total) by mouth daily. 30 tablet 11  . lisinopril (PRINIVIL,ZESTRIL) 2.5 MG tablet Take 1 tablet (2.5 mg total) by mouth daily. 30 tablet 5  . nitroGLYCERIN (NITROSTAT) 0.4 MG SL tablet Place 1 tablet (0.4 mg total) under the tongue every 5 (five) minutes x 3 doses as needed for chest pain. 25 tablet 12   No current facility-administered medications for this visit.    Allergies:   Penicillins    Social History:  The patient  reports that he has been smoking Cigars and Cigarettes.  He started smoking about 41 years ago. He has smoked for the past 58 years. He has never used smokeless tobacco. He reports that he does not drink alcohol or use illicit drugs.   Family History:  The patient's family history includes Heart disease in his father.    ROS:  Please see the history of present illness.   Otherwise, review of systems are positive for back and hip discomfort..   All other systems are reviewed and negative.    PHYSICAL EXAM: VS:  BP 110/52 mmHg  Pulse 60  Ht 6\' 5"  (1.956 m)  Wt 85.548 kg (188 lb 9.6 oz)  BMI 22.36 kg/m2  SpO2 97% , BMI Body mass index is 22.36 kg/(m^2). GEN: Well nourished, well developed, in no acute distress HEENT: normal Neck: no JVD, carotid bruits, or masses Cardiac: RRR; no murmurs, rubs, or gallops,no edema  Respiratory:  clear to auscultation bilaterally, normal work of breathing GI: soft, nontender, nondistended, + BS MS: no deformity or atrophy Skin: warm and dry, no rash Neuro:  Strength and sensation are intact Psych: euthymic mood, full affect   EKG:  EKG is not ordered today.    Recent Labs: 11/29/2013: ALT 17; Pro B Natriuretic peptide (BNP) 739.7*; TSH 1.120 06/27/2014: BUN 18; Creatinine, Ser 0.92;  Hemoglobin 13.2; Platelets 182.0; Potassium 4.4; Sodium 139    Lipid Panel    Component Value Date/Time   CHOL 121 11/30/2013 0400   TRIG 92 11/30/2013 0400   HDL 33* 11/30/2013 0400   CHOLHDL 3.7 11/30/2013 0400   VLDL 18 11/30/2013 0400   LDLCALC 70 11/30/2013 0400      Wt Readings from Last 3 Encounters:  07/31/14 85.548 kg (188 lb 9.6 oz)  02/28/14 90.719 kg (200 lb)  12/27/13 88.905 kg (196 lb)      Other studies Reviewed: Additional studies/ records that were reviewed today include: None. Review of the above records demonstrates:    ASSESSMENT AND PLAN:  Essential hypertension- current excellent control  Hyperlipidemia- needs lipids in late 2016  Chronic anticoagulation- blood work stable. Hematocrit greater than 38  Coronary artery disease involving bypass graft of transplanted heart without angina pectoris- asymptomatic  Chronic atrial fibrillation- asymptomatic     Current medicines are reviewed at length with the patient today.  The patient does not have concerns regarding medicines.  The following changes have been made:  no change  Labs/ tests ordered today include:  No orders of the defined types were placed in this encounter.     Disposition:   FU with HS in 1 year  Signed, Sinclair Grooms, MD  07/31/2014 8:12 AM    [facility] [place], [place], [place]  [address] Phone: [phone]; Fax: [phone]

## 2014-07-31 NOTE — Patient Instructions (Signed)
Medication Instructions:  Your physician recommends that you continue on your current medications as directed. Please refer to the Current Medication list given to you today.   Labwork: NONE  Testing/Procedures: NONE  Follow-Up: Your physician wants you to follow-up in: 12 months with Dr. Tamala Julian. You will receive a reminder letter in the mail two months in advance. If you don't receive a letter, please call our office to schedule the follow-up appointment.   Any Other Special Instructions Will Be Listed Below (If Applicable).

## 2014-07-31 NOTE — Telephone Encounter (Signed)
Last lipid panel was 11/2013. Needs fating statin panel Oct, Nov or Dec.  Dx. Is hyperlipidemia

## 2014-08-01 NOTE — Telephone Encounter (Signed)
Fasting lab orders (lipid,alt) in Epic. lmom pt will need fasting labs between oct-dec 2016. Recall letter in [place]. Pt to call the office if any questions.

## 2014-08-01 NOTE — Addendum Note (Signed)
Addended by: Lamar Laundry on: 08/01/2014 09:20 AM   Modules accepted: Orders

## 2014-08-10 DIAGNOSIS — I10 Essential (primary) hypertension: Secondary | ICD-10-CM | POA: Diagnosis not present

## 2014-08-10 DIAGNOSIS — R7309 Other abnormal glucose: Secondary | ICD-10-CM | POA: Diagnosis not present

## 2014-08-10 DIAGNOSIS — I480 Paroxysmal atrial fibrillation: Secondary | ICD-10-CM | POA: Diagnosis not present

## 2014-08-14 ENCOUNTER — Other Ambulatory Visit: Payer: Self-pay

## 2014-08-22 DIAGNOSIS — M47816 Spondylosis without myelopathy or radiculopathy, lumbar region: Secondary | ICD-10-CM | POA: Diagnosis not present

## 2014-08-22 DIAGNOSIS — M545 Low back pain: Secondary | ICD-10-CM | POA: Diagnosis not present

## 2014-08-28 DIAGNOSIS — M62830 Muscle spasm of back: Secondary | ICD-10-CM | POA: Diagnosis not present

## 2014-08-28 DIAGNOSIS — M47816 Spondylosis without myelopathy or radiculopathy, lumbar region: Secondary | ICD-10-CM | POA: Diagnosis not present

## 2014-08-28 DIAGNOSIS — M545 Low back pain: Secondary | ICD-10-CM | POA: Diagnosis not present

## 2014-08-30 DIAGNOSIS — M47816 Spondylosis without myelopathy or radiculopathy, lumbar region: Secondary | ICD-10-CM | POA: Diagnosis not present

## 2014-08-30 DIAGNOSIS — M545 Low back pain: Secondary | ICD-10-CM | POA: Diagnosis not present

## 2014-08-30 DIAGNOSIS — M62830 Muscle spasm of back: Secondary | ICD-10-CM | POA: Diagnosis not present

## 2014-09-06 DIAGNOSIS — M545 Low back pain: Secondary | ICD-10-CM | POA: Diagnosis not present

## 2014-09-06 DIAGNOSIS — M47816 Spondylosis without myelopathy or radiculopathy, lumbar region: Secondary | ICD-10-CM | POA: Diagnosis not present

## 2014-09-06 DIAGNOSIS — M62830 Muscle spasm of back: Secondary | ICD-10-CM | POA: Diagnosis not present

## 2014-09-08 DIAGNOSIS — M62830 Muscle spasm of back: Secondary | ICD-10-CM | POA: Diagnosis not present

## 2014-09-08 DIAGNOSIS — M47816 Spondylosis without myelopathy or radiculopathy, lumbar region: Secondary | ICD-10-CM | POA: Diagnosis not present

## 2014-09-08 DIAGNOSIS — M545 Low back pain: Secondary | ICD-10-CM | POA: Diagnosis not present

## 2014-11-09 DIAGNOSIS — Z23 Encounter for immunization: Secondary | ICD-10-CM | POA: Diagnosis not present

## 2014-11-10 ENCOUNTER — Other Ambulatory Visit: Payer: Self-pay

## 2014-11-10 NOTE — Telephone Encounter (Signed)
[name], MD at 07/31/2014 8:12 AM  apixaban (ELIQUIS) 5 MG TABS tablet take 1 tablet by mouth twice a day        Current medicines are reviewed at length with the patient today. The patient does not have concerns regarding medicines. The following changes have been made: no change

## 2014-11-13 MED ORDER — APIXABAN 5 MG PO TABS: 5.0000 mg | ORAL_TABLET | Freq: Two times a day (BID) | ORAL | Status: DC

## 2014-11-22 DIAGNOSIS — M1712 Unilateral primary osteoarthritis, left knee: Secondary | ICD-10-CM | POA: Diagnosis not present

## 2014-11-23 DIAGNOSIS — Z961 Presence of intraocular lens: Secondary | ICD-10-CM | POA: Diagnosis not present

## 2014-11-29 DIAGNOSIS — M1711 Unilateral primary osteoarthritis, right knee: Secondary | ICD-10-CM | POA: Diagnosis not present

## 2014-11-30 ENCOUNTER — Other Ambulatory Visit: Payer: Self-pay

## 2014-11-30 ENCOUNTER — Telehealth: Payer: Self-pay | Admitting: Interventional Cardiology

## 2014-11-30 MED ORDER — ATORVASTATIN CALCIUM 80 MG PO TABS: 80.0000 mg | ORAL_TABLET | Freq: Every day | ORAL | Status: DC

## 2014-11-30 MED ORDER — ISOSORBIDE MONONITRATE ER 30 MG PO TB24: 30.0000 mg | ORAL_TABLET | Freq: Every day | ORAL | Status: DC

## 2014-11-30 NOTE — Telephone Encounter (Signed)
Walk in pt form-Pt needs refills-placed in refill bin

## 2014-11-30 NOTE — Telephone Encounter (Signed)
Shawn Crome, MD at 07/31/2014 8:12 AM  atorvastatin (LIPITOR) 80 MG tabletTake 1 tablet (80 mg total) by mouth daily at 6 PM isosorbide mononitrate (IMDUR) 30 MG 24 hr tablet Take 1 tablet (30 mg total) by mouth daily Current medicines are reviewed at length with the patient today. The patient does not have concerns regarding medicines. The following changes have been made: no change  Notes Recorded by Shawn Crome, MD on 06/28/2014 at 6:53 PM Normal labs

## 2014-12-08 DIAGNOSIS — M1711 Unilateral primary osteoarthritis, right knee: Secondary | ICD-10-CM | POA: Diagnosis not present

## 2014-12-08 DIAGNOSIS — M1712 Unilateral primary osteoarthritis, left knee: Secondary | ICD-10-CM | POA: Diagnosis not present

## 2014-12-08 DIAGNOSIS — M170 Bilateral primary osteoarthritis of knee: Secondary | ICD-10-CM | POA: Diagnosis not present

## 2014-12-21 ENCOUNTER — Other Ambulatory Visit: Payer: Self-pay

## 2014-12-21 MED ORDER — LISINOPRIL 2.5 MG PO TABS: 2.5000 mg | ORAL_TABLET | Freq: Every day | ORAL | Status: DC

## 2015-02-21 ENCOUNTER — Other Ambulatory Visit: Payer: Self-pay | Admitting: Interventional Cardiology

## 2015-02-21 MED ORDER — ISOSORBIDE MONONITRATE ER 30 MG PO TB24: 30.0000 mg | ORAL_TABLET | Freq: Every day | ORAL | Status: DC

## 2015-02-26 ENCOUNTER — Other Ambulatory Visit: Payer: Self-pay | Admitting: Interventional Cardiology

## 2015-03-15 DIAGNOSIS — Z0000 Encounter for general adult medical examination without abnormal findings: Secondary | ICD-10-CM | POA: Diagnosis not present

## 2015-03-15 DIAGNOSIS — I10 Essential (primary) hypertension: Secondary | ICD-10-CM | POA: Diagnosis not present

## 2015-03-15 DIAGNOSIS — Z1389 Encounter for screening for other disorder: Secondary | ICD-10-CM | POA: Diagnosis not present

## 2015-05-14 ENCOUNTER — Other Ambulatory Visit: Payer: Self-pay | Admitting: *Deleted

## 2015-05-14 MED ORDER — APIXABAN 5 MG PO TABS: 5.0000 mg | ORAL_TABLET | Freq: Two times a day (BID) | ORAL | Status: DC

## 2015-05-21 DIAGNOSIS — Z0000 Encounter for general adult medical examination without abnormal findings: Secondary | ICD-10-CM | POA: Diagnosis not present

## 2015-05-21 DIAGNOSIS — Z8551 Personal history of malignant neoplasm of bladder: Secondary | ICD-10-CM | POA: Diagnosis not present

## 2015-06-23 IMAGING — CR DG CHEST 2V
2 series · 2 of 2 positions shown · non-contrast
Comparison: Chest radiograph May 27, 2010

CLINICAL DATA: Chest pain today, history of CABG.

EXAM:
CHEST  2 VIEW

[w chest pa]
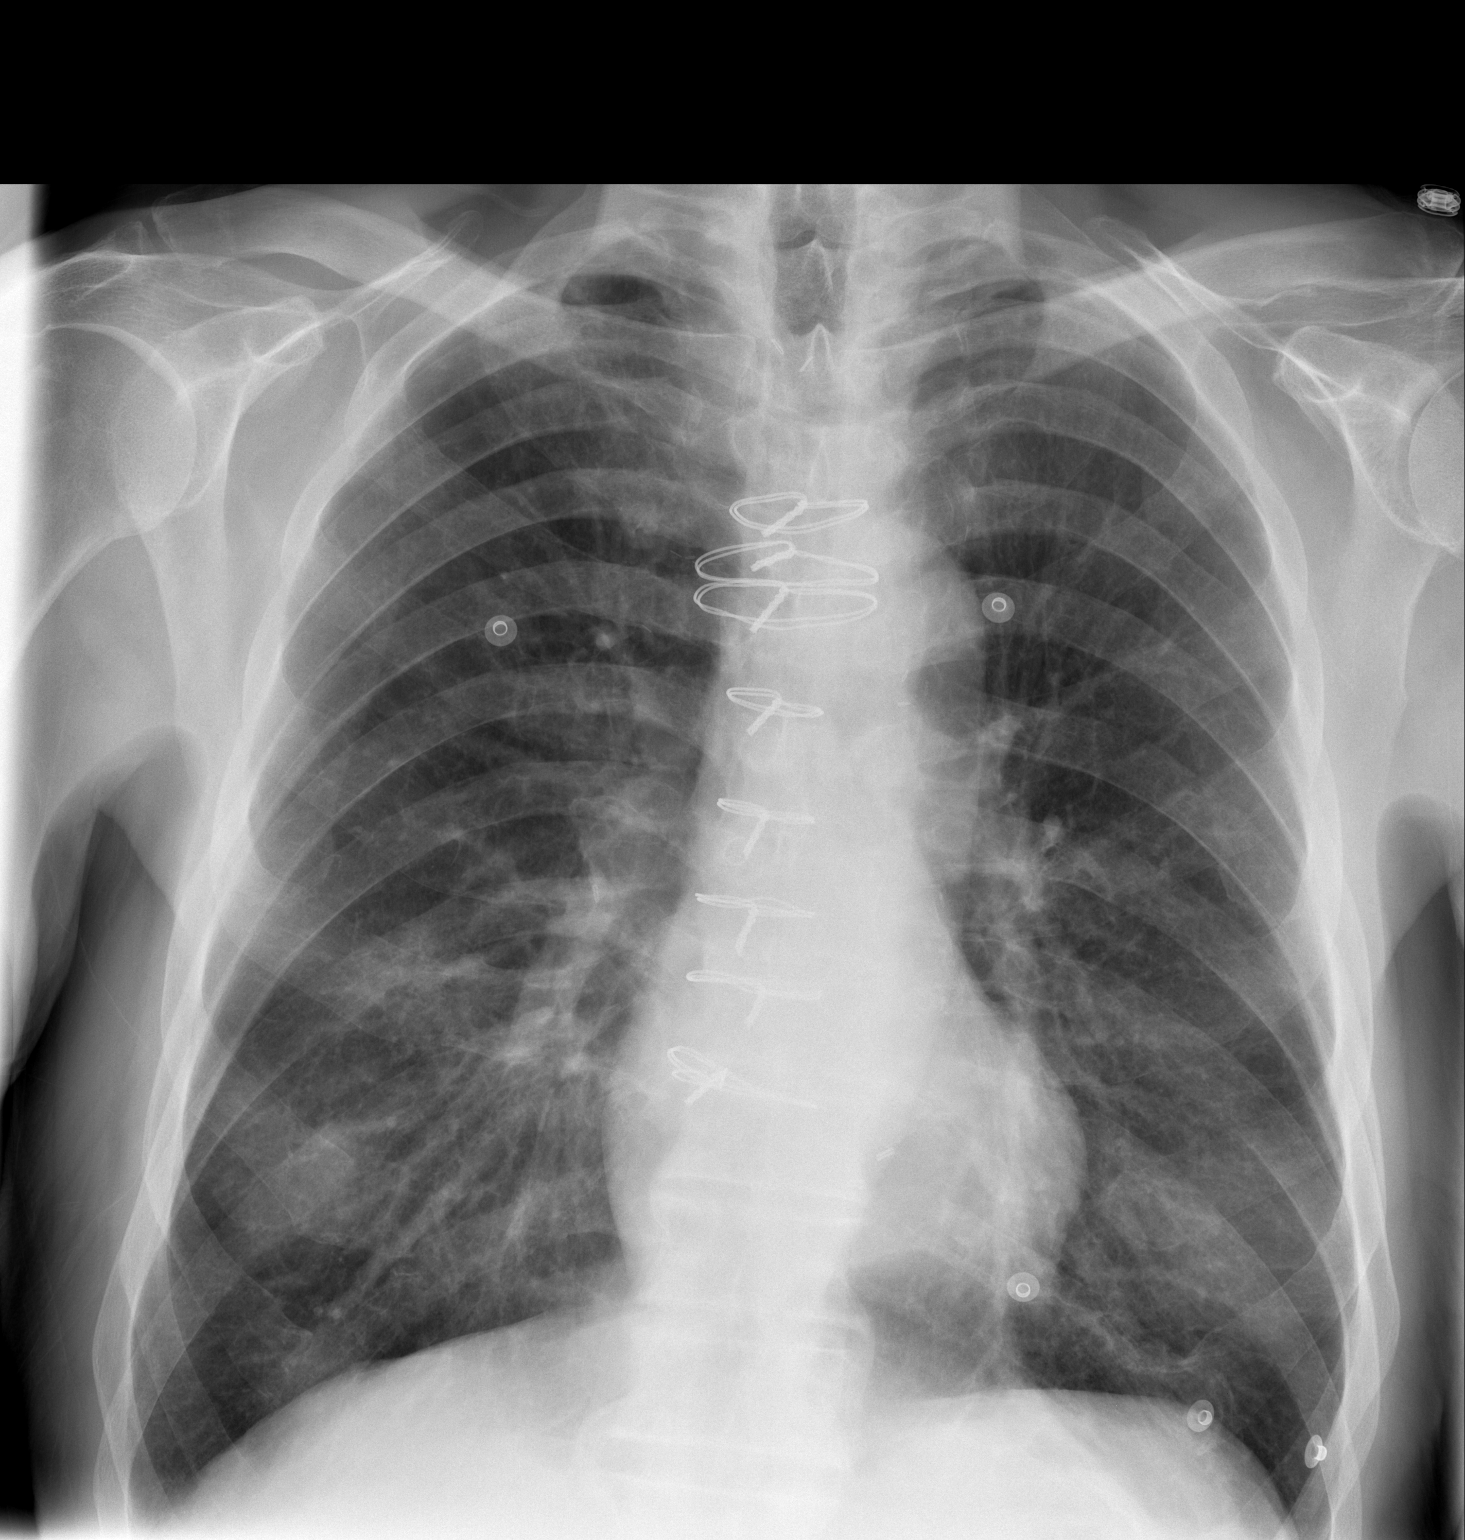

[w chest lat]
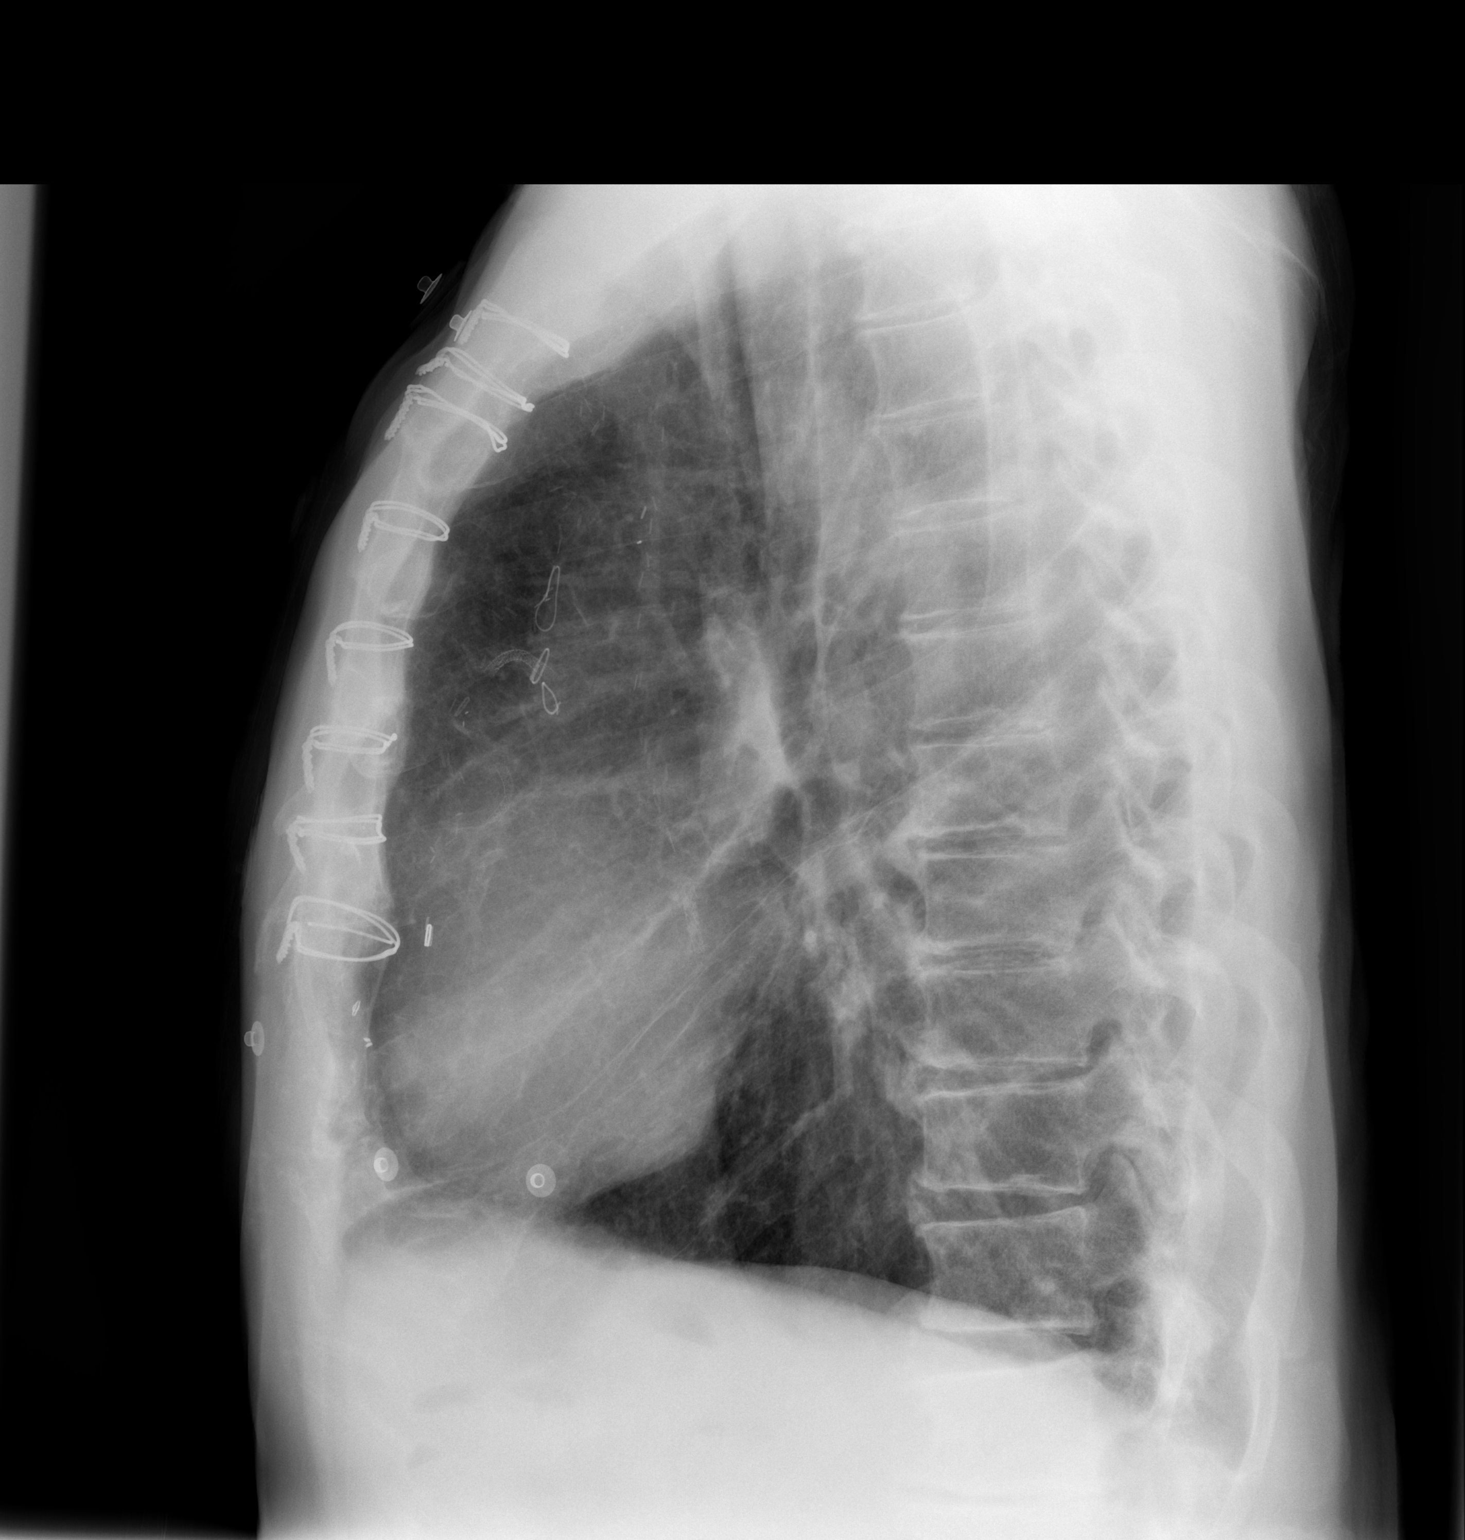

[2 of 2 positions shown; findings below may reference images not displayed]

FINDINGS: Cardiac silhouette is normal in size, mediastinal silhouette is not
enlarged, status post median sternotomy for apparent coronary artery
bypass grafting. Similar mild chronic interstitial changes with
increased lung volumes, flattened hemidiaphragms. No pleural
effusions or focal consolidations. No pneumothorax.

Straightened thoracic kyphosis mild degenerative spondylosis.
IMPRESSION: COPD, no acute cardiopulmonary process.

  By: Nokib Matbor

## 2015-08-14 ENCOUNTER — Other Ambulatory Visit: Payer: Self-pay | Admitting: *Deleted

## 2015-08-14 MED ORDER — LISINOPRIL 2.5 MG PO TABS: 2.5000 mg | ORAL_TABLET | Freq: Every day | ORAL | Status: DC

## 2015-08-14 MED ORDER — APIXABAN 5 MG PO TABS: 5.0000 mg | ORAL_TABLET | Freq: Two times a day (BID) | ORAL | Status: DC

## 2015-08-20 DIAGNOSIS — M1712 Unilateral primary osteoarthritis, left knee: Secondary | ICD-10-CM | POA: Diagnosis not present

## 2015-08-20 DIAGNOSIS — M170 Bilateral primary osteoarthritis of knee: Secondary | ICD-10-CM | POA: Diagnosis not present

## 2015-08-20 DIAGNOSIS — M1711 Unilateral primary osteoarthritis, right knee: Secondary | ICD-10-CM | POA: Diagnosis not present

## 2015-08-27 DIAGNOSIS — M1711 Unilateral primary osteoarthritis, right knee: Secondary | ICD-10-CM | POA: Diagnosis not present

## 2015-08-27 DIAGNOSIS — M25552 Pain in left hip: Secondary | ICD-10-CM | POA: Diagnosis not present

## 2015-09-03 DIAGNOSIS — M1711 Unilateral primary osteoarthritis, right knee: Secondary | ICD-10-CM | POA: Diagnosis not present

## 2015-09-12 ENCOUNTER — Other Ambulatory Visit: Payer: Self-pay

## 2015-09-12 MED ORDER — APIXABAN 5 MG PO TABS: 5.0000 mg | ORAL_TABLET | Freq: Two times a day (BID) | ORAL | 0 refills | Status: DC

## 2015-09-12 MED ORDER — LISINOPRIL 2.5 MG PO TABS: 2.5000 mg | ORAL_TABLET | Freq: Every day | ORAL | 0 refills | Status: DC

## 2015-09-12 NOTE — Telephone Encounter (Signed)
I called and spoke with patient and he was unaware he missed his 1 year f/u appointment and I explained it was after 5 so our schedulers went home and he would have to call in the morning to make his follow up appointment. I explained he will not receive further refills until appointment is scheduled.  He stated his understanding and said he would call and schedule appointment in the morning.    Shawn Redwood, MD at 07/31/2014 8:12 AM  apixaban (ELIQUIS) 5 MG TABS tablet take 1 tablet by mouth twice a day    lisinopril (PRINIVIL,ZESTRIL) 2.5 MG tablet Take 1 tablet (2.5 mg total) by mouth daily   Current medicines are reviewed at length with the patient today.  The patient does not have concerns regarding medicines.  The following changes have been made:  no change  Patient Instructions   Medication Instructions:  Your physician recommends that you continue on your current medications as directed. Please refer to the Current Medication list given to you today.

## 2015-09-26 DIAGNOSIS — I5022 Chronic systolic (congestive) heart failure: Secondary | ICD-10-CM | POA: Insufficient documentation

## 2015-09-26 DIAGNOSIS — I255 Ischemic cardiomyopathy: Secondary | ICD-10-CM | POA: Insufficient documentation

## 2015-09-26 DIAGNOSIS — R001 Bradycardia, unspecified: Secondary | ICD-10-CM | POA: Insufficient documentation

## 2015-09-26 DIAGNOSIS — I480 Paroxysmal atrial fibrillation: Secondary | ICD-10-CM | POA: Insufficient documentation

## 2015-09-26 DIAGNOSIS — R739 Hyperglycemia, unspecified: Secondary | ICD-10-CM | POA: Insufficient documentation

## 2015-09-26 NOTE — Progress Notes (Signed)
Cardiology Office Note    Date:  09/27/2015  ID:  Shawn [name], DOB July 31, 1935, MRN [id number] PCP:  Dorian Heckle, MD  Cardiologist:  Dr. Tamala Julian  Chief Complaint: annual f/u of CAD and afib  History of Present Illness:  Shawn [name] is a 80 y.o. male with history of paroxysmal atrial fib, sinus bradycardia (HR 59 in 2015), CAD (s/p CABG in 1992, stenting to Cx 2004/2005, BMS-SVG-LAD 2008, MI 2011 s/p stents to SVG-LAD, redo CABGx2 in 2012), ischemic cardiomyopathy (EF 45-50%), HTN, hyperlipidemia, arthritis, remote bladder cancer 2007, tobacco abuse who presents for annual follow-up. Last LHC 2015 with patent RIMA-LAD and patent SVG-OM from 2012, 1992 grafts with an atretic LIMA, which had supplied the marginal vessel, an occluded vein graft which had supplied the LAD, and an occluded SVG which had supplied the RCA. Recommendation was to continue med rx. LVEF was 40-45% with mild distal inferior hypokinesis. Last EKG in 2015 showed NSR/SB. Last labs in 2016 showed glucose 183, Hgb 13.2, Cr 0.92.  He returns for follow-up feeling good. He walks the dog daily without any chest pain or SOB. He has not noticed any palpitations. No bleeding reported. He does report generalized fatigue in the evenings. BP is running on softer side in clinic, confirmed by me - 98/62 in the right arm, 92/52 in the left. He denies any dizziness or syncope.   Past Medical History:  Diagnosis Date  . Anterior myocardial infarction ([place]) 09/2009   [name] 04/22/2010  . Arthritis    "knees" (11/29/2013)  . Bladder cancer ([place]) 2007  . Chronic systolic CHF (congestive heart failure) ([place])   . Coronary artery disease    a. s/p CABG in 1992. b. stenting to Cx 2004/2005. c. BMS-SVG-LAD 2008. d. MI 2011 s/p stents to SVG-LAD. e. redo CABGx2 in 2012. f. [place] 2015: med rx.  . Essential hypertension   . Hyperglycemia   . Hyperlipidemia   . Ischemic cardiomyopathy    a. EF 40-45% by [place] 2015.  Marland Kitchen PAF (paroxysmal  atrial fibrillation) ([place])   . Sinus bradycardia    a. HR 50s on prior EKG.    Past Surgical History:  Procedure Laterality Date  . APPENDECTOMY  1940's  . CARDIAC CATHETERIZATION  09/2006   [name] 06/20/2010  . CATARACT EXTRACTION W/ INTRAOCULAR LENS  IMPLANT, BILATERAL Bilateral 1990's  . CORONARY ANGIOPLASTY WITH STENT PLACEMENT  "several since 1992"  . CORONARY ARTERY BYPASS GRAFT  1992; ~2012   "CABG X3; CABH X 3"  . JOINT REPLACEMENT    . LEFT HEART CATHETERIZATION WITH CORONARY/GRAFT ANGIOGRAM N/A 11/30/2013   Procedure: LEFT HEART CATHETERIZATION WITH Beatrix Fetters;  Surgeon: Troy Sine, MD;  Location: [facility] C[facility] LAB;  Service: Cardiovascular;  Laterality: N/A;  . TONSILLECTOMY  1940's  . TOTAL HIP ARTHROPLASTY Left 1992  . TOTAL HIP ARTHROPLASTY Right ~ 1994    Current Medications: Current Outpatient Prescriptions  Medication Sig Dispense Refill  . apixaban (ELIQUIS) 5 MG TABS tablet Take 1 tablet (5 mg total) by mouth 2 (two) times daily. Please call and schedule a 1 yr follow up appointment. 60 tablet 0  . atenolol (TENORMIN) 25 MG tablet Take 1 tablet by mouth daily.    Marland Kitchen atorvastatin (LIPITOR) 80 MG tablet take 1 tablet by mouth once daily AT 6:00 PM 30 tablet 6  . isosorbide mononitrate (IMDUR) 30 MG 24 hr tablet Take 1 tablet (30 mg total) by mouth daily. 30 tablet 6  . lisinopril (PRINIVIL,ZESTRIL)  2.5 MG tablet Take 1 tablet (2.5 mg total) by mouth daily. Patient is overdue for a 1 yr follow up appointment. Please call and schedule 30 tablet 0  . nitroGLYCERIN (NITROSTAT) 0.4 MG SL tablet Place 1 tablet (0.4 mg total) under the tongue every 5 (five) minutes x 3 doses as needed for chest pain. 25 tablet 12   No current facility-administered medications for this visit.      Allergies:   Penicillins   Social History   Social History  . Marital status: Married    Spouse name: N/A  . Number of children: N/A  . Years of education: N/A   Social History  Main Topics  . Smoking status: Light Tobacco Smoker    Years: 58.00    Types: Cigars, Cigarettes    Start date: 02/17/1973  . Smokeless tobacco: Never Used     Comment: 11/29/2013 "I smoke 1 cigar/day now; stopped smoking cigarettes"  . Alcohol use No  . Drug use: No  . Sexual activity: Yes   Other Topics Concern  . None   Social History Narrative  . None     Family History:  The patient's family history includes Heart disease in his father.   ROS:   Please see the history of present illness.  All other systems are reviewed and otherwise negative.    PHYSICAL EXAM:   VS:  BP (!) 92/52 (BP Location: Right Arm, Patient Position: Sitting, Cuff Size: Normal)   Pulse 72   Ht 6\' 5"  (1.956 m)   Wt 181 lb 12.8 oz (82.5 kg)   BMI 21.56 kg/m   BMI: Body mass index is 21.56 kg/m. GEN: Well nourished, well developed WM, in no acute distress  HEENT: normocephalic, atraumatic Neck: no JVD, carotid bruits, or masses Cardiac: RRR; no murmurs, rubs, or gallops, no edema  Respiratory:  clear to auscultation bilaterally, normal work of breathing GI: soft, nontender, nondistended, + BS MS: no deformity or atrophy  Skin: warm and dry, no rash Neuro:  Alert and Oriented x 3, Strength and sensation are intact, follows commands Psych: euthymic mood, full affect  Wt Readings from Last 3 Encounters:  09/27/15 181 lb 12.8 oz (82.5 kg)  07/31/14 188 lb 9.6 oz (85.5 kg)  02/28/14 200 lb (90.7 kg)      Studies/Labs Reviewed:   EKG:  EKG was ordered today and personally reviewed by me and demonstrates NSR 72bpm, left axis deviation, incomplete RBBB, TWI avL, no acute ST-T changes  Recent Labs: No results found for requested labs within last 8760 hours.   Lipid Panel    Component Value Date/Time   CHOL 121 11/30/2013 0400   TRIG 92 11/30/2013 0400   HDL 33 (L) 11/30/2013 0400   CHOLHDL 3.7 11/30/2013 0400   VLDL 18 11/30/2013 0400   LDLCALC 70 11/30/2013 0400    Additional studies/  records that were reviewed today include: Summarized above.    ASSESSMENT & PLAN:   1. CAD - no recent anginal sx. Continue statin, BB. Not on ASA due to concomitant Eliquis. 2. Hypertension - BP running low in clinic. He reports generalized fatigue in the evening. Will stop lisinopril. I told him if his fatigue does not improve and home SBP continues to run [id number] systolic, to then cut Imdur in half. I started with these meds first - did not want to adjust BB just yet given his afib and maintenance of NSR on atenolol. He is to call our office with an  update if he needs to make this change. Will also check basic labs for fatigue to exclude thyroid dysfunction, anemia,, etc. 3. Paroxysmal atrial fib - maintaining NSR. Continue anticoagulation. Check CMET/CBC to make sure OK to continue present dose. 4. ICM/chronic systolic CHF - maintaining euvolemia. Stop ACEI as above due to hypotension. Follow for sx. 5. Hyperlipidemia - check fasting lipids at next visit. Will get LFTs today with above labs. 6. Hyperglycemia - f/u A1C today. Prior value was 185 so ? If he has DM which could be contributing to his fatigue.  Disposition: F/u with Dr. Tamala Julian in 6 months.   Medication Adjustments/Labs and Tests Ordered: Current medicines are reviewed at length with the patient today.  Concerns regarding medicines are outlined above. Medication changes, Labs and Tests ordered today are summarized above and listed in the Patient Instructions accessible in Encounters.   Raechel Ache PA-C  09/27/2015 9:42 AM    [place], [place], [place]  [address] Phone: [phone]; Fax: [phone]

## 2015-09-27 ENCOUNTER — Ambulatory Visit (INDEPENDENT_AMBULATORY_CARE_PROVIDER_SITE_OTHER): Payer: Medicare Other | Admitting: Physician Assistant

## 2015-09-27 ENCOUNTER — Other Ambulatory Visit: Payer: Self-pay | Admitting: Interventional Cardiology

## 2015-09-27 ENCOUNTER — Encounter: Payer: Self-pay | Admitting: Physician Assistant

## 2015-09-27 VITALS — BP 92/52 | HR 72 | Ht 77.0 in | Wt 181.8 lb

## 2015-09-27 DIAGNOSIS — R5383 Other fatigue: Secondary | ICD-10-CM | POA: Diagnosis not present

## 2015-09-27 DIAGNOSIS — I959 Hypotension, unspecified: Secondary | ICD-10-CM

## 2015-09-27 DIAGNOSIS — I480 Paroxysmal atrial fibrillation: Secondary | ICD-10-CM

## 2015-09-27 DIAGNOSIS — R739 Hyperglycemia, unspecified: Secondary | ICD-10-CM | POA: Diagnosis not present

## 2015-09-27 DIAGNOSIS — I25812 Atherosclerosis of bypass graft of coronary artery of transplanted heart without angina pectoris: Secondary | ICD-10-CM | POA: Diagnosis not present

## 2015-09-27 DIAGNOSIS — I255 Ischemic cardiomyopathy: Secondary | ICD-10-CM | POA: Diagnosis not present

## 2015-09-27 DIAGNOSIS — I10 Essential (primary) hypertension: Secondary | ICD-10-CM | POA: Diagnosis not present

## 2015-09-27 DIAGNOSIS — I5022 Chronic systolic (congestive) heart failure: Secondary | ICD-10-CM | POA: Diagnosis not present

## 2015-09-27 DIAGNOSIS — E785 Hyperlipidemia, unspecified: Secondary | ICD-10-CM

## 2015-09-27 LAB — COMPREHENSIVE METABOLIC PANEL
ALBUMIN: 3.5 g/dL — AB (ref 3.6–5.1)
ALK PHOS: 162 U/L — AB (ref 40–115)
ALT: 24 U/L (ref 9–46)
AST: 23 U/L (ref 10–35)
BILIRUBIN TOTAL: 0.6 mg/dL (ref 0.2–1.2)
BUN: 21 mg/dL (ref 7–25)
CALCIUM: 9.2 mg/dL (ref 8.6–10.3)
CO2: 32 mmol/L — ABNORMAL HIGH (ref 20–31)
Chloride: 105 mmol/L (ref 98–110)
Creat: 0.97 mg/dL (ref 0.70–1.18)
Glucose, Bld: 136 mg/dL — ABNORMAL HIGH (ref 65–99)
POTASSIUM: 4.3 mmol/L (ref 3.5–5.3)
Sodium: 140 mmol/L (ref 135–146)
TOTAL PROTEIN: 6.2 g/dL (ref 6.1–8.1)

## 2015-09-27 LAB — TSH: TSH: 1.4 m[IU]/L (ref 0.40–4.50)

## 2015-09-27 LAB — CBC
HCT: 41.8 % (ref 38.5–50.0)
HEMOGLOBIN: 13.9 g/dL (ref 13.2–17.1)
MCH: 31.7 pg (ref 27.0–33.0)
MCHC: 33.3 g/dL (ref 32.0–36.0)
MCV: 95.2 fL (ref 80.0–100.0)
MPV: 11.6 fL (ref 7.5–12.5)
PLATELETS: 212 10*3/uL (ref 140–400)
RBC: 4.39 MIL/uL (ref 4.20–5.80)
RDW: 14 % (ref 11.0–15.0)
WBC: 7.1 10*3/uL (ref 3.8–10.8)

## 2015-09-27 NOTE — Patient Instructions (Signed)
Medication Instructions:  Your physician has recommended you make the following change in your medication:  1.  STOP the Lisinopril  If you still feel fatigued in several days after stopping the Lisinopril, you may take 1/2 of the Imdur.  Just call our office and let us know so we can make the change in your medication list.3   Labwork: TODAY:  CMET, CBC, TSH, & A1C 6 MONTHS AT O/V:  FASTING LIPID & LFT  Testing/Procedures: None ordered  Follow-Up: Your physician wants you to follow-up in: [place] will receive a reminder letter in the mail two months in advance. If you don't receive a letter, please call our office to schedule the follow-up appointment.   Any Other Special Instructions Will Be Listed Below (If Applicable).     If you need a refill on your cardiac medications before your next appointment, please call your pharmacy.

## 2015-09-28 LAB — HEMOGLOBIN A1C
HEMOGLOBIN A1C: 6.3 % — AB (ref <5.7)
Mean Plasma Glucose: 134 mg/dL

## 2015-10-12 ENCOUNTER — Other Ambulatory Visit: Payer: Self-pay | Admitting: Interventional Cardiology

## 2015-10-19 DIAGNOSIS — M170 Bilateral primary osteoarthritis of knee: Secondary | ICD-10-CM | POA: Diagnosis not present

## 2015-10-26 DIAGNOSIS — M1712 Unilateral primary osteoarthritis, left knee: Secondary | ICD-10-CM | POA: Diagnosis not present

## 2015-11-02 DIAGNOSIS — M1712 Unilateral primary osteoarthritis, left knee: Secondary | ICD-10-CM | POA: Diagnosis not present

## 2015-11-12 DIAGNOSIS — Z23 Encounter for immunization: Secondary | ICD-10-CM | POA: Diagnosis not present

## 2016-03-19 DIAGNOSIS — Z8551 Personal history of malignant neoplasm of bladder: Secondary | ICD-10-CM | POA: Diagnosis not present

## 2016-03-19 DIAGNOSIS — I10 Essential (primary) hypertension: Secondary | ICD-10-CM | POA: Diagnosis not present

## 2016-03-19 DIAGNOSIS — R7303 Prediabetes: Secondary | ICD-10-CM | POA: Diagnosis not present

## 2016-03-19 DIAGNOSIS — E7800 Pure hypercholesterolemia, unspecified: Secondary | ICD-10-CM | POA: Diagnosis not present

## 2016-03-19 DIAGNOSIS — Z1389 Encounter for screening for other disorder: Secondary | ICD-10-CM | POA: Diagnosis not present

## 2016-03-19 DIAGNOSIS — I480 Paroxysmal atrial fibrillation: Secondary | ICD-10-CM | POA: Diagnosis not present

## 2016-03-19 DIAGNOSIS — Z0000 Encounter for general adult medical examination without abnormal findings: Secondary | ICD-10-CM | POA: Diagnosis not present

## 2016-03-19 DIAGNOSIS — I25769 Atherosclerosis of bypass graft of coronary artery of transplanted heart with unspecified angina pectoris: Secondary | ICD-10-CM | POA: Diagnosis not present

## 2016-03-19 DIAGNOSIS — I5022 Chronic systolic (congestive) heart failure: Secondary | ICD-10-CM | POA: Diagnosis not present

## 2016-03-27 NOTE — Progress Notes (Signed)
Cardiology Office Note    Date:  03/28/2016   ID:  Shawn [name], DOB 10-23-1935, MRN [id number]  PCP:  Vena Austria, MD  Cardiologist: Sinclair Grooms, MD   Chief Complaint  Patient presents with  . Coronary Artery Disease    History of Present Illness:  Shawn [name] is a 81 y.o. male with history of paroxysmal atrial fib, sinus bradycardia (HR 59 in 2015), CAD (s/p CABG in 1992, stenting to Cx 2004/2005, BMS-SVG-LAD 2008MI 2011 with stents to the SVG to LAD, repeat CABG 2012, ischemic cardiomyopathy (EF 45-50%), hypertension, hyperlipidemia, and history of remote bladder cancer. Continues to smoke cigars  He has no complaints. He denies palpitations and angina. No lower extremity swelling has occurred. There is no orthopnea, PND, or syncope. He denies palpitations.  Past Medical History:  Diagnosis Date  . Anterior myocardial infarction ([place]) 09/2009   [name] 04/22/2010  . Arthritis    "knees" (11/29/2013)  . Bladder cancer ([place]) 2007  . Chronic systolic CHF (congestive heart failure) ([place])   . Coronary artery disease    a. s/p CABG in 1992. b. stenting to Cx 2004/2005. c. BMS-SVG-LAD 2008. d. MI 2011 s/p stents to SVG-LAD. e. redo CABGx2 in 2012. f. [place] 2015: med rx.  . Essential hypertension   . Hyperglycemia   . Hyperlipidemia   . Hypotension    a. ACEI stopped 09/2015 due to this.  . Ischemic cardiomyopathy    a. EF 40-45% by [place] 2015.  Marland Kitchen PAF (paroxysmal atrial fibrillation) ([place])   . Sinus bradycardia    a. HR 50s on prior EKG.    Past Surgical History:  Procedure Laterality Date  . APPENDECTOMY  1940's  . CARDIAC CATHETERIZATION  09/2006   [name] 06/20/2010  . CATARACT EXTRACTION W/ INTRAOCULAR LENS  IMPLANT, BILATERAL Bilateral 1990's  . CORONARY ANGIOPLASTY WITH STENT PLACEMENT  "several since 1992"  . CORONARY ARTERY BYPASS GRAFT  1992; ~2012   "CABG X3; CABH X 3"  . JOINT REPLACEMENT    . LEFT HEART CATHETERIZATION WITH CORONARY/GRAFT  ANGIOGRAM N/A 11/30/2013   Procedure: LEFT HEART CATHETERIZATION WITH Beatrix Fetters;  Surgeon: Troy Sine, MD;  [facility]: [facility] C[facility] LAB;  Service: Cardiovascular;  Laterality: N/A;  . TONSILLECTOMY  1940's  . TOTAL HIP ARTHROPLASTY Left 1992  . TOTAL HIP ARTHROPLASTY Right ~ 1994    Current Medications: Outpatient Medications Prior to Visit  Medication Sig Dispense Refill  . apixaban (ELIQUIS) 5 MG TABS tablet Take 1 tablet (5 mg total) by mouth 2 (two) times daily. 60 tablet 11  . atenolol (TENORMIN) 25 MG tablet Take 1 tablet by mouth daily.    Marland Kitchen atorvastatin (LIPITOR) 80 MG tablet take 1 tablet by mouth once daily AT 6 PM 30 tablet 11  . isosorbide mononitrate (IMDUR) 30 MG 24 hr tablet take 1 tablet by mouth once daily 30 tablet 11  . nitroGLYCERIN (NITROSTAT) 0.4 MG SL tablet Place 1 tablet (0.4 mg total) under the tongue every 5 (five) minutes x 3 doses as needed for chest pain. 25 tablet 12   No facility-administered medications prior to visit.      Allergies:   Penicillins   Social History   Social History  . Marital status: Married    Spouse name: N/A  . Number of children: N/A  . Years of education: N/A   Social History Main Topics  . Smoking status: Light Tobacco Smoker    Years: 58.00  Types: Cigars, Cigarettes    Start date: 02/17/1973  . Smokeless tobacco: Never Used     Comment: 11/29/2013 "I smoke 1 cigar/day now; stopped smoking cigarettes"  . Alcohol use No  . Drug use: No  . Sexual activity: Yes   Other Topics Concern  . None   Social History Narrative  . None     Family History:  The patient's family history includes Heart disease in his father.   ROS:   Please see the history of present illness.    Decrease in balance. Occasional frequent falls. No head trauma. Appetite and weight are stable.  All other systems reviewed and are negative.   PHYSICAL EXAM:   VS:  BP 122/60 (BP Location: Left Arm)   Pulse 68   Ht 6\' 5"  (1.956 m)    Wt 189 lb 12.8 oz (86.1 kg)   BMI 22.51 kg/m    GEN: Well nourished, well developed, in no acute distress  HEENT: normal  Neck: no JVD, carotid bruits, or masses Cardiac: RRR; no murmurs, rubs, or gallops,no edema  Respiratory:  clear to auscultation bilaterally, normal work of breathing GI: soft, nontender, nondistended, + BS MS: no deformity or atrophy  Skin: warm and dry, no rash Neuro:  Alert and Oriented x 3, Strength and sensation are intact Psych: euthymic mood, full affect  Wt Readings from Last 3 Encounters:  03/28/16 189 lb 12.8 oz (86.1 kg)  09/27/15 181 lb 12.8 oz (82.5 kg)  07/31/14 188 lb 9.6 oz (85.5 kg)      Studies/Labs Reviewed:   EKG:  EKG  Not performed.  Recent Labs: 09/27/2015: ALT 24; BUN 21; Creat 0.97; Hemoglobin 13.9; Platelets 212; Potassium 4.3; Sodium 140; TSH 1.40   Lipid Panel    Component Value Date/Time   CHOL 121 11/30/2013 0400   TRIG 92 11/30/2013 0400   HDL 33 (L) 11/30/2013 0400   CHOLHDL 3.7 11/30/2013 0400   VLDL 18 11/30/2013 0400   LDLCALC 70 11/30/2013 0400    Additional studies/ records that were reviewed today include:  No new data    ASSESSMENT:    1. Coronary artery disease of bypass graft of native heart with stable angina pectoris ([place])   2. Chronic systolic CHF (congestive heart failure) ([place])   3. Essential hypertension   4. PAF (paroxysmal atrial fibrillation) ([place])   5. Sinus bradycardia   6. Chronic anticoagulation      PLAN:  In order of problems listed above:  1. Stable without episodes of angina. We have refilled his nitroglycerin prescription. Plan no ischemic evaluation at this time. Encouraged aerobic activity. 2. Last EF evaluation in 2015 revealed 45-50%. No heart failure symptoms. 3. Control blood pressure. He is at or below target. 4. On anticoagulation therapy without neurological events. No complaints of palpitations. 5. Bradycardias persisted but asymptomatic. Heart rate today is not bad  and we shall continue beta blocker therapy as prescribed previously. 6. Continue Eliquis as prescribed. No bleeding complications. Call if any head trauma. All blood in urine or stool.    Medication Adjustments/Labs and Tests Ordered: Current medicines are reviewed at length with the patient today.  Concerns regarding medicines are outlined above.  Medication changes, Labs and Tests ordered today are listed in the Patient Instructions below. There are no Patient Instructions on file for this visit.   Signed, Sinclair Grooms, MD  03/28/2016 8:36 AM    [place], [place]  [address] Phone: [phone]; Fax: [phone]

## 2016-03-28 ENCOUNTER — Encounter: Payer: Self-pay | Admitting: Interventional Cardiology

## 2016-03-28 ENCOUNTER — Ambulatory Visit (INDEPENDENT_AMBULATORY_CARE_PROVIDER_SITE_OTHER): Payer: Medicare Other | Admitting: Interventional Cardiology

## 2016-03-28 VITALS — BP 122/60 | HR 68 | Ht 77.0 in | Wt 189.8 lb

## 2016-03-28 DIAGNOSIS — I480 Paroxysmal atrial fibrillation: Secondary | ICD-10-CM

## 2016-03-28 DIAGNOSIS — I209 Angina pectoris, unspecified: Secondary | ICD-10-CM | POA: Diagnosis not present

## 2016-03-28 DIAGNOSIS — I10 Essential (primary) hypertension: Secondary | ICD-10-CM

## 2016-03-28 DIAGNOSIS — I5022 Chronic systolic (congestive) heart failure: Secondary | ICD-10-CM | POA: Diagnosis not present

## 2016-03-28 DIAGNOSIS — I25708 Atherosclerosis of coronary artery bypass graft(s), unspecified, with other forms of angina pectoris: Secondary | ICD-10-CM

## 2016-03-28 DIAGNOSIS — R001 Bradycardia, unspecified: Secondary | ICD-10-CM

## 2016-03-28 DIAGNOSIS — Z7901 Long term (current) use of anticoagulants: Secondary | ICD-10-CM

## 2016-03-28 MED ORDER — NITROGLYCERIN 0.4 MG SL SUBL: 0.4000 mg | SUBLINGUAL_TABLET | SUBLINGUAL | 5 refills | Status: DC | PRN

## 2016-03-28 NOTE — Patient Instructions (Signed)

## 2016-05-07 DIAGNOSIS — M170 Bilateral primary osteoarthritis of knee: Secondary | ICD-10-CM | POA: Diagnosis not present

## 2016-05-14 DIAGNOSIS — M170 Bilateral primary osteoarthritis of knee: Secondary | ICD-10-CM | POA: Diagnosis not present

## 2016-05-22 DIAGNOSIS — M170 Bilateral primary osteoarthritis of knee: Secondary | ICD-10-CM | POA: Diagnosis not present

## 2016-05-26 DIAGNOSIS — C679 Malignant neoplasm of bladder, unspecified: Secondary | ICD-10-CM | POA: Diagnosis not present

## 2016-09-01 DIAGNOSIS — Z961 Presence of intraocular lens: Secondary | ICD-10-CM | POA: Diagnosis not present

## 2016-09-09 DIAGNOSIS — M79642 Pain in left hand: Secondary | ICD-10-CM | POA: Diagnosis not present

## 2016-09-09 DIAGNOSIS — M109 Gout, unspecified: Secondary | ICD-10-CM | POA: Diagnosis not present

## 2016-09-12 DIAGNOSIS — M109 Gout, unspecified: Secondary | ICD-10-CM | POA: Diagnosis not present

## 2016-09-26 ENCOUNTER — Other Ambulatory Visit: Payer: Self-pay | Admitting: Interventional Cardiology

## 2016-10-07 DIAGNOSIS — M7541 Impingement syndrome of right shoulder: Secondary | ICD-10-CM | POA: Diagnosis not present

## 2016-10-21 DIAGNOSIS — M7541 Impingement syndrome of right shoulder: Secondary | ICD-10-CM | POA: Diagnosis not present

## 2016-10-23 DIAGNOSIS — M7541 Impingement syndrome of right shoulder: Secondary | ICD-10-CM | POA: Diagnosis not present

## 2016-10-27 DIAGNOSIS — M7541 Impingement syndrome of right shoulder: Secondary | ICD-10-CM | POA: Diagnosis not present

## 2016-10-30 DIAGNOSIS — M7541 Impingement syndrome of right shoulder: Secondary | ICD-10-CM | POA: Diagnosis not present

## 2016-11-03 DIAGNOSIS — M7541 Impingement syndrome of right shoulder: Secondary | ICD-10-CM | POA: Diagnosis not present

## 2016-11-04 DIAGNOSIS — M7022 Olecranon bursitis, left elbow: Secondary | ICD-10-CM | POA: Diagnosis not present

## 2016-11-06 DIAGNOSIS — M7541 Impingement syndrome of right shoulder: Secondary | ICD-10-CM | POA: Diagnosis not present

## 2016-11-10 DIAGNOSIS — M7541 Impingement syndrome of right shoulder: Secondary | ICD-10-CM | POA: Diagnosis not present

## 2016-11-12 ENCOUNTER — Other Ambulatory Visit: Payer: Self-pay | Admitting: Interventional Cardiology

## 2016-11-13 DIAGNOSIS — M7541 Impingement syndrome of right shoulder: Secondary | ICD-10-CM | POA: Diagnosis not present

## 2016-11-17 DIAGNOSIS — M7541 Impingement syndrome of right shoulder: Secondary | ICD-10-CM | POA: Diagnosis not present

## 2016-11-18 DIAGNOSIS — M7541 Impingement syndrome of right shoulder: Secondary | ICD-10-CM | POA: Diagnosis not present

## 2016-11-20 DIAGNOSIS — M7022 Olecranon bursitis, left elbow: Secondary | ICD-10-CM | POA: Diagnosis not present

## 2016-11-20 DIAGNOSIS — M7541 Impingement syndrome of right shoulder: Secondary | ICD-10-CM | POA: Diagnosis not present

## 2016-11-20 DIAGNOSIS — M25522 Pain in left elbow: Secondary | ICD-10-CM | POA: Diagnosis not present

## 2016-11-28 DIAGNOSIS — R2232 Localized swelling, mass and lump, left upper limb: Secondary | ICD-10-CM | POA: Diagnosis not present

## 2016-11-28 DIAGNOSIS — M7022 Olecranon bursitis, left elbow: Secondary | ICD-10-CM | POA: Diagnosis not present

## 2016-11-28 DIAGNOSIS — M7541 Impingement syndrome of right shoulder: Secondary | ICD-10-CM | POA: Diagnosis not present

## 2016-11-28 DIAGNOSIS — M19011 Primary osteoarthritis, right shoulder: Secondary | ICD-10-CM | POA: Diagnosis not present

## 2016-12-04 DIAGNOSIS — M19011 Primary osteoarthritis, right shoulder: Secondary | ICD-10-CM | POA: Diagnosis not present

## 2016-12-08 DIAGNOSIS — M7022 Olecranon bursitis, left elbow: Secondary | ICD-10-CM | POA: Diagnosis not present

## 2016-12-09 DIAGNOSIS — Z23 Encounter for immunization: Secondary | ICD-10-CM | POA: Diagnosis not present

## 2016-12-15 DIAGNOSIS — R2232 Localized swelling, mass and lump, left upper limb: Secondary | ICD-10-CM | POA: Diagnosis not present

## 2016-12-15 DIAGNOSIS — M7541 Impingement syndrome of right shoulder: Secondary | ICD-10-CM | POA: Diagnosis not present

## 2016-12-15 DIAGNOSIS — M7022 Olecranon bursitis, left elbow: Secondary | ICD-10-CM | POA: Diagnosis not present

## 2017-01-12 DIAGNOSIS — M7541 Impingement syndrome of right shoulder: Secondary | ICD-10-CM | POA: Diagnosis not present

## 2017-01-12 DIAGNOSIS — M7542 Impingement syndrome of left shoulder: Secondary | ICD-10-CM | POA: Diagnosis not present

## 2017-01-12 DIAGNOSIS — M75101 Unspecified rotator cuff tear or rupture of right shoulder, not specified as traumatic: Secondary | ICD-10-CM | POA: Diagnosis not present

## 2017-01-17 DIAGNOSIS — M75101 Unspecified rotator cuff tear or rupture of right shoulder, not specified as traumatic: Secondary | ICD-10-CM | POA: Diagnosis not present

## 2017-01-23 DIAGNOSIS — M7542 Impingement syndrome of left shoulder: Secondary | ICD-10-CM | POA: Diagnosis not present

## 2017-01-23 DIAGNOSIS — M75101 Unspecified rotator cuff tear or rupture of right shoulder, not specified as traumatic: Secondary | ICD-10-CM | POA: Diagnosis not present

## 2017-01-23 DIAGNOSIS — M7541 Impingement syndrome of right shoulder: Secondary | ICD-10-CM | POA: Diagnosis not present

## 2017-03-30 ENCOUNTER — Other Ambulatory Visit: Payer: Self-pay | Admitting: Interventional Cardiology

## 2017-03-30 DIAGNOSIS — I5022 Chronic systolic (congestive) heart failure: Secondary | ICD-10-CM | POA: Diagnosis not present

## 2017-03-30 DIAGNOSIS — Z1389 Encounter for screening for other disorder: Secondary | ICD-10-CM | POA: Diagnosis not present

## 2017-03-30 DIAGNOSIS — Z6824 Body mass index (BMI) 24.0-24.9, adult: Secondary | ICD-10-CM | POA: Diagnosis not present

## 2017-03-30 DIAGNOSIS — I25769 Atherosclerosis of bypass graft of coronary artery of transplanted heart with unspecified angina pectoris: Secondary | ICD-10-CM | POA: Diagnosis not present

## 2017-03-30 DIAGNOSIS — I10 Essential (primary) hypertension: Secondary | ICD-10-CM | POA: Diagnosis not present

## 2017-03-30 DIAGNOSIS — Z0000 Encounter for general adult medical examination without abnormal findings: Secondary | ICD-10-CM | POA: Diagnosis not present

## 2017-03-30 DIAGNOSIS — M170 Bilateral primary osteoarthritis of knee: Secondary | ICD-10-CM | POA: Diagnosis not present

## 2017-03-30 DIAGNOSIS — I480 Paroxysmal atrial fibrillation: Secondary | ICD-10-CM | POA: Diagnosis not present

## 2017-03-30 DIAGNOSIS — Z8551 Personal history of malignant neoplasm of bladder: Secondary | ICD-10-CM | POA: Diagnosis not present

## 2017-03-30 DIAGNOSIS — R7303 Prediabetes: Secondary | ICD-10-CM | POA: Diagnosis not present

## 2017-03-30 DIAGNOSIS — E7800 Pure hypercholesterolemia, unspecified: Secondary | ICD-10-CM | POA: Diagnosis not present

## 2017-03-30 MED ORDER — ISOSORBIDE MONONITRATE ER 30 MG PO TB24: 30.0000 mg | ORAL_TABLET | Freq: Every day | ORAL | 0 refills | Status: DC

## 2017-03-30 MED ORDER — ATORVASTATIN CALCIUM 80 MG PO TABS: ORAL_TABLET | ORAL | 0 refills | Status: DC

## 2017-04-23 DIAGNOSIS — M79641 Pain in right hand: Secondary | ICD-10-CM | POA: Diagnosis not present

## 2017-04-23 DIAGNOSIS — G5601 Carpal tunnel syndrome, right upper limb: Secondary | ICD-10-CM | POA: Insufficient documentation

## 2017-04-27 ENCOUNTER — Other Ambulatory Visit: Payer: Self-pay | Admitting: Interventional Cardiology

## 2017-04-27 MED ORDER — ATORVASTATIN CALCIUM 80 MG PO TABS: ORAL_TABLET | ORAL | 0 refills | Status: DC

## 2017-04-27 MED ORDER — ISOSORBIDE MONONITRATE ER 30 MG PO TB24: 30.0000 mg | ORAL_TABLET | Freq: Every day | ORAL | 0 refills | Status: DC

## 2017-05-05 NOTE — Progress Notes (Signed)
CARDIOLOGY OFFICE NOTE  Date:  05/06/2017    [name] Date of Birth: 07-09-1935 Medical Record #[id number]  PCP:  [name], MD  Cardiologist:  Tamala Julian    Chief Complaint  Patient presents with  . Coronary Artery Disease    Work in visit - seen for Dr. Tamala Julian    History of Present Illness: Shawn [name] is a 82 y.o. male who presents today for a work in visit. Seen for Dr. Tamala Julian.   He has a history of PAF, sinus bradycardia, CAD (s/p CABG in 1992, stenting to Cx 2004/2005, BMS-SVG-LAD 2008 and MI 2011 with stents to the SVG to LAD, repeat CABG 2012, ischemic cardiomyopathy (EF 45-50%), hypertension, hyperlipidemia, and history of remote bladder cancer. Continues to smoke cigars.   Seen about a year ago. Was doing well.   Comes in today. Here alone. Needs medicines refilled. His labs are checked by PCP - Dr. Alyson Ingles - he has had done already this year. No complaints whatsoever. No chest pain. Rare palpitations. No falls. Tolerating Eliquis without issue. He is quite pleased with how he is doing. More upset that the office did not get in touch with him to arrange follow up.   Past Medical History:  Diagnosis Date  . Anterior myocardial infarction ([place]) 09/2009   [name] 04/22/2010  . Arthritis    "knees" (11/29/2013)  . Bladder cancer ([place]) 2007  . Chronic systolic CHF (congestive heart failure) ([place])   . Coronary artery disease    a. s/p CABG in 1992. b. stenting to Cx 2004/2005. c. BMS-SVG-LAD 2008. d. MI 2011 s/p stents to SVG-LAD. e. redo CABGx2 in 2012. f. [place] 2015: med rx.  . Essential hypertension   . Hyperglycemia   . Hyperlipidemia   . Hypotension    a. ACEI stopped 09/2015 due to this.  . Ischemic cardiomyopathy    a. EF 40-45% by [place] 2015.  Marland Kitchen PAF (paroxysmal atrial fibrillation) ([place])   . Sinus bradycardia    a. HR 50s on prior EKG.    Past Surgical History:  Procedure Laterality Date  . APPENDECTOMY  1940's  . CARDIAC CATHETERIZATION  09/2006    [name] 06/20/2010  . CATARACT EXTRACTION W/ INTRAOCULAR LENS  IMPLANT, BILATERAL Bilateral 1990's  . CORONARY ANGIOPLASTY WITH STENT PLACEMENT  "several since 1992"  . CORONARY ARTERY BYPASS GRAFT  1992; ~2012   "CABG X3; CABH X 3"  . JOINT REPLACEMENT    . LEFT HEART CATHETERIZATION WITH CORONARY/GRAFT ANGIOGRAM N/A 11/30/2013   Procedure: LEFT HEART CATHETERIZATION WITH Beatrix Fetters;  Surgeon: Troy Sine, MD;  Location: [facility] CATH LAB;  Service: Cardiovascular;  Laterality: N/A;  . TONSILLECTOMY  1940's  . TOTAL HIP ARTHROPLASTY Left 1992  . TOTAL HIP ARTHROPLASTY Right ~ 1994     Medications: Current Meds  Medication Sig  . apixaban (ELIQUIS) 5 MG TABS tablet Take 1 tablet (5 mg total) by mouth 2 (two) times daily.  Marland Kitchen atenolol (TENORMIN) 25 MG tablet Take 1 tablet (25 mg total) by mouth daily.  Marland Kitchen atorvastatin (LIPITOR) 80 MG tablet take 1 tablet by mouth once daily AT 6 PM. Please make overdue yearly appt with Dr. Tamala Julian before anymore refills. 2nd attempt  . isosorbide mononitrate (IMDUR) 30 MG 24 hr tablet Take 1 tablet (30 mg total) by mouth daily.  . nitroGLYCERIN (NITROSTAT) 0.4 MG SL tablet Place 1 tablet (0.4 mg total) under the tongue every 5 (five) minutes x 3 doses as needed  for chest pain.  . [DISCONTINUED] atenolol (TENORMIN) 25 MG tablet Take 1 tablet by mouth daily.  . [DISCONTINUED] atorvastatin (LIPITOR) 80 MG tablet take 1 tablet by mouth once daily AT 6 PM. Please make overdue yearly appt with Dr. Tamala Julian before anymore refills. 2nd attempt  . [DISCONTINUED] ELIQUIS 5 MG TABS tablet take 1 tablet by mouth twice a day  . [DISCONTINUED] isosorbide mononitrate (IMDUR) 30 MG 24 hr tablet Take 1 tablet (30 mg total) by mouth daily. Please make overdue yearly appt with Dr. Tamala Julian before anymore refills. 2nd attempt  . [DISCONTINUED] nitroGLYCERIN (NITROSTAT) 0.4 MG SL tablet Place 1 tablet (0.4 mg total) under the tongue every 5 (five) minutes x 3 doses as needed  for chest pain.     Allergies: Allergies  Allergen Reactions  . Penicillins Rash    Social History: The patient  reports that he has been smoking cigars and cigarettes.  He started smoking about 44 years ago. He has smoked for the past 58.00 years. he has never used smokeless tobacco. He reports that he does not drink alcohol or use drugs.   Family History: The patient's family history includes Heart disease in his father.   Review of Systems: Please see the history of present illness.   Otherwise, the review of systems is positive for none.   All other systems are reviewed and negative.   Physical Exam: VS:  BP 118/62 (BP Location: Left Arm, Patient Position: Sitting, Cuff Size: Normal)   Pulse 69   Ht 6\' 5"  (1.956 m)   Wt 190 lb 1.9 oz (86.2 kg)   BMI 22.54 kg/m  .  BMI Body mass index is 22.54 kg/m.  Wt Readings from Last 3 Encounters:  05/06/17 190 lb 1.9 oz (86.2 kg)  03/28/16 189 lb 12.8 oz (86.1 kg)  09/27/15 181 lb 12.8 oz (82.5 kg)    General: Pleasant. Elderly male. Looks younger than his stated age. Alert and in no acute distress.  He is tall and thin.  HEENT: Normal.  Neck: Supple, no JVD, carotid bruits, or masses noted.  Cardiac: Regular rate and rhythm. No murmurs, rubs, or gallops. No edema.  Respiratory:  Lungs are clear to auscultation bilaterally with normal work of breathing.  GI: Soft and nontender.  MS: No deformity or atrophy. Gait and ROM intact.  Skin: Warm and dry. Color is normal.  Neuro:  Strength and sensation are intact and no gross focal deficits noted.  Psych: Alert, appropriate and with normal affect.   LABORATORY DATA:  EKG:  EKG is ordered today. This shows NSR. Septal Q's. Unchanged.   Lab Results  Component Value Date   WBC 7.1 09/27/2015   HGB 13.9 09/27/2015   HCT 41.8 09/27/2015   PLT 212 09/27/2015   GLUCOSE 136 (H) 09/27/2015   CHOL 121 11/30/2013   TRIG 92 11/30/2013   HDL 33 (L) 11/30/2013   LDLCALC 70 11/30/2013     ALT 24 09/27/2015   AST 23 09/27/2015   NA 140 09/27/2015   K 4.3 09/27/2015   CL 105 09/27/2015   CREATININE 0.97 09/27/2015   BUN 21 09/27/2015   CO2 32 (H) 09/27/2015   TSH 1.40 09/27/2015   INR 1.16 11/29/2013   HGBA1C 6.3 (H) 09/27/2015     BNP (last 3 results) No results for input(s): BNP in the last 8760 hours.  ProBNP (last 3 results) No results for input(s): PROBNP in the last 8760 hours.   Other Studies Reviewed  Today:   Assessment/Plan:  1. CAD with prior CABG as well as redo and prior PCI - he is doing very well clinically. No angina. No changes with current plan of care. Continue with CV risk factor modification. NTG refilled today. Other medicines refilled today as well.   2. Chronic systolic HF/ICM - very well compensated. On very little medicine. No symptoms.   3. HTN - BP is great on current regimen.   4. PAF - remains in sinus - remains on Eliquis - no problems noted.   5. Sinus bradycardia - HR is 69 today.   6. Chronic anticoagulation - no problems noted. Has had lab with PCP - will call for copy to make sure he has had appropriate surveillance labs for Eliquis.    Current medicines are reviewed with the patient today.  The patient does not have concerns regarding medicines other than what has been noted above.  The following changes have been made:  See above.  Labs/ tests ordered today include:    Orders Placed This Encounter  Procedures  . EKG 12-Lead     Disposition:   FU with Dr. Tamala Julian in one year.    Patient is agreeable to this plan and will call if any problems develop in the interim.   SignedTruitt Merle, NP  05/06/2017 10:40 AM  [facility] [address] [place], [place]  [address] Phone: [phone] Fax: [phone]

## 2017-05-06 ENCOUNTER — Telehealth: Payer: Self-pay | Admitting: *Deleted

## 2017-05-06 ENCOUNTER — Ambulatory Visit (INDEPENDENT_AMBULATORY_CARE_PROVIDER_SITE_OTHER): Payer: Medicare Other | Admitting: Nurse Practitioner

## 2017-05-06 ENCOUNTER — Encounter: Payer: Self-pay | Admitting: Nurse Practitioner

## 2017-05-06 VITALS — BP 118/62 | HR 69 | Ht 77.0 in | Wt 190.1 lb

## 2017-05-06 DIAGNOSIS — I259 Chronic ischemic heart disease, unspecified: Secondary | ICD-10-CM | POA: Diagnosis not present

## 2017-05-06 MED ORDER — NITROGLYCERIN 0.4 MG SL SUBL: 0.4000 mg | SUBLINGUAL_TABLET | SUBLINGUAL | 5 refills | Status: DC | PRN

## 2017-05-06 MED ORDER — ISOSORBIDE MONONITRATE ER 30 MG PO TB24: 30.0000 mg | ORAL_TABLET | Freq: Every day | ORAL | 3 refills | Status: DC

## 2017-05-06 MED ORDER — APIXABAN 5 MG PO TABS: 5.0000 mg | ORAL_TABLET | Freq: Two times a day (BID) | ORAL | 3 refills | Status: DC

## 2017-05-06 MED ORDER — ATENOLOL 25 MG PO TABS: 25.0000 mg | ORAL_TABLET | Freq: Every day | ORAL | 3 refills | Status: DC

## 2017-05-06 MED ORDER — ATORVASTATIN CALCIUM 80 MG PO TABS: ORAL_TABLET | ORAL | 3 refills | Status: DC

## 2017-05-06 NOTE — Telephone Encounter (Signed)
lvm for medical records at Dr. Noland Fordyce office @ [phone] to fax pt's recent labs results.

## 2017-05-06 NOTE — Patient Instructions (Addendum)
We will be checking the following labs today - NONE  We will call Dr. Noland Fordyce office and get a copy of your labs.    Medication Instructions:    Continue with your current medicines.   I have refilled your medicines today     Testing/Procedures To Be Arranged:  N/A  Follow-Up:   See Dr. Tamala Julian in one year.  If you have not heard from [place] - say by Christmas - call us. You can always come see me if needed.      Other Special Instructions:   Keep up the good work.     If you need a refill on your cardiac medications before your next appointment, please call your pharmacy.   Call the [place] office at [phone] if you have any questions, problems or concerns.

## 2017-05-21 DIAGNOSIS — G5601 Carpal tunnel syndrome, right upper limb: Secondary | ICD-10-CM | POA: Diagnosis not present

## 2017-05-21 DIAGNOSIS — G5602 Carpal tunnel syndrome, left upper limb: Secondary | ICD-10-CM | POA: Diagnosis not present

## 2017-06-08 DIAGNOSIS — C678 Malignant neoplasm of overlapping sites of bladder: Secondary | ICD-10-CM | POA: Diagnosis not present

## 2017-06-11 DIAGNOSIS — Z961 Presence of intraocular lens: Secondary | ICD-10-CM | POA: Diagnosis not present

## 2017-06-19 DIAGNOSIS — G5602 Carpal tunnel syndrome, left upper limb: Secondary | ICD-10-CM | POA: Diagnosis not present

## 2017-06-19 DIAGNOSIS — G5601 Carpal tunnel syndrome, right upper limb: Secondary | ICD-10-CM | POA: Diagnosis not present

## 2017-07-06 DIAGNOSIS — G5602 Carpal tunnel syndrome, left upper limb: Secondary | ICD-10-CM | POA: Diagnosis not present

## 2017-07-06 DIAGNOSIS — G5601 Carpal tunnel syndrome, right upper limb: Secondary | ICD-10-CM | POA: Diagnosis not present

## 2017-07-09 DIAGNOSIS — G5602 Carpal tunnel syndrome, left upper limb: Secondary | ICD-10-CM | POA: Diagnosis not present

## 2017-07-09 DIAGNOSIS — G5601 Carpal tunnel syndrome, right upper limb: Secondary | ICD-10-CM | POA: Diagnosis not present

## 2017-07-22 ENCOUNTER — Telehealth: Payer: Self-pay | Admitting: *Deleted

## 2017-07-22 NOTE — Telephone Encounter (Signed)
   Chart reviewed as part of pre-operative protocol coverage.  Pat from triage brought to my attention the paper form that they did not list any meds to be held. Callback staff, please call ortho to find out if they need to hold any blood thinners prior to this procedure.  Charlie Pitter, PA-C 07/22/2017, 5:07 PM

## 2017-07-22 NOTE — Telephone Encounter (Signed)
   [facility] Pre-operative Risk Assessment    Request for surgical clearance:  1. What type of surgery is being performed? Left carpal tunnel release   2. When is this surgery scheduled? 08/06/17   3. What type of clearance is required (medical clearance vs. Pharmacy clearance to hold med vs. Both)? Medical  4. Are there any medications that need to be held prior to surgery and how long?none noted  5. Practice name and name of physician performing surgery? [facility]. Dr. Caralyn Guile   6. What is your office phone number [phone]   7.   What is your office fax number [phone] Oran Rein  8.   Anesthesia type (None, local, MAC, general) ? Local with sedation   _________________________________________________________________   (provider comments below)

## 2017-07-23 NOTE — Telephone Encounter (Signed)
Spoke with Santiago Bur, Surgery Scheduler at [facility],  Pt is to hold Eliquis for his upcoming sx.  Please advise!

## 2017-07-24 DIAGNOSIS — M1711 Unilateral primary osteoarthritis, right knee: Secondary | ICD-10-CM | POA: Diagnosis not present

## 2017-07-24 DIAGNOSIS — M1712 Unilateral primary osteoarthritis, left knee: Secondary | ICD-10-CM | POA: Diagnosis not present

## 2017-07-24 NOTE — Telephone Encounter (Signed)
   Primary Cardiologist: Dr Gwenlyn Found  Chart reviewed and patient interviewed over the phone as part of pre-operative protocol coverage. Given past medical history and time since last visit, based on ACC/AHA guidelines, Shawn [name] would be at acceptable risk for the planned procedure without further cardiovascular testing.   OK to hold Eliquis 3 days pre op if needed.   I will route this recommendation to the requesting party via Epic fax function and remove from pre-op pool.  Please call with questions.  Kerin Ransom, PA-C 07/24/2017, 1:24 PM

## 2017-07-24 NOTE — Telephone Encounter (Signed)
   Primary Cardiologist: Dr Tamala Julian  Chart reviewed and patient interviewed over the phone as part of pre-operative protocol coverage. Given past medical history and time since last visit, based on ACC/AHA guidelines, BRAVE DACK would be at acceptable risk for the planned procedure without further cardiovascular testing.   I will ask pharmacy for their input on holding Eliquis pre op. Final clearance to be faxed once this is available.   Kerin Ransom, PA-C 07/24/2017, 9:31 AM

## 2017-07-24 NOTE — Telephone Encounter (Signed)
Left message for patient of recommendations. 

## 2017-07-24 NOTE — Telephone Encounter (Signed)
Patient with diagnosis of atrial fibrillation on Eliquis for anticoagulation.    Procedure: carpal tunnel release Date of procedure: 08/06/17  CHADS2-VASc score of  5 (CHF, HTN, AGE, , CAD, AGE, )  CrCl 89.1 Platelet count 212  Per office protocol, patient can hold Eliquis for 3 days prior to procedure.

## 2017-07-24 NOTE — Telephone Encounter (Signed)
Left message to call back  Kerin Ransom PA-C 07/24/2017 9:20 AM

## 2017-07-31 DIAGNOSIS — M1712 Unilateral primary osteoarthritis, left knee: Secondary | ICD-10-CM | POA: Diagnosis not present

## 2017-07-31 DIAGNOSIS — M1711 Unilateral primary osteoarthritis, right knee: Secondary | ICD-10-CM | POA: Diagnosis not present

## 2017-08-06 DIAGNOSIS — G5602 Carpal tunnel syndrome, left upper limb: Secondary | ICD-10-CM | POA: Diagnosis not present

## 2017-08-07 DIAGNOSIS — M1712 Unilateral primary osteoarthritis, left knee: Secondary | ICD-10-CM | POA: Diagnosis not present

## 2017-08-07 DIAGNOSIS — M1711 Unilateral primary osteoarthritis, right knee: Secondary | ICD-10-CM | POA: Diagnosis not present

## 2017-10-28 DIAGNOSIS — G5601 Carpal tunnel syndrome, right upper limb: Secondary | ICD-10-CM | POA: Diagnosis not present

## 2017-12-07 DIAGNOSIS — Z23 Encounter for immunization: Secondary | ICD-10-CM | POA: Diagnosis not present

## 2018-01-26 DIAGNOSIS — G5601 Carpal tunnel syndrome, right upper limb: Secondary | ICD-10-CM | POA: Diagnosis not present

## 2018-01-26 DIAGNOSIS — M65311 Trigger thumb, right thumb: Secondary | ICD-10-CM | POA: Diagnosis not present

## 2018-01-26 DIAGNOSIS — G5602 Carpal tunnel syndrome, left upper limb: Secondary | ICD-10-CM | POA: Diagnosis not present

## 2018-02-24 DIAGNOSIS — M65311 Trigger thumb, right thumb: Secondary | ICD-10-CM | POA: Insufficient documentation

## 2018-02-25 DIAGNOSIS — G5602 Carpal tunnel syndrome, left upper limb: Secondary | ICD-10-CM | POA: Diagnosis not present

## 2018-02-25 DIAGNOSIS — M65311 Trigger thumb, right thumb: Secondary | ICD-10-CM | POA: Diagnosis not present

## 2018-02-25 DIAGNOSIS — G5601 Carpal tunnel syndrome, right upper limb: Secondary | ICD-10-CM | POA: Diagnosis not present

## 2018-03-17 DIAGNOSIS — M170 Bilateral primary osteoarthritis of knee: Secondary | ICD-10-CM | POA: Diagnosis not present

## 2018-03-25 DIAGNOSIS — M170 Bilateral primary osteoarthritis of knee: Secondary | ICD-10-CM | POA: Diagnosis not present

## 2018-04-01 DIAGNOSIS — M170 Bilateral primary osteoarthritis of knee: Secondary | ICD-10-CM | POA: Diagnosis not present

## 2018-05-06 ENCOUNTER — Encounter: Payer: Self-pay | Admitting: Interventional Cardiology

## 2018-05-11 ENCOUNTER — Telehealth: Payer: Self-pay | Admitting: Interventional Cardiology

## 2018-05-11 NOTE — Telephone Encounter (Signed)
Called pt in regards to appt scheduled for 05/17/2018 with Dr. Tamala Julian.  Left message to call back.

## 2018-05-11 NOTE — Telephone Encounter (Signed)
Spoke with pt and he denies any cardiac issues.  Rescheduled pt to be seen in July.  Pt aware to call the office sooner if any issues occur.

## 2018-05-11 NOTE — Telephone Encounter (Signed)
Follow Up: ° ° ° °Returning your call from this morning. °

## 2018-05-17 ENCOUNTER — Ambulatory Visit: Payer: Medicare Other | Admitting: Interventional Cardiology

## 2018-05-17 ENCOUNTER — Other Ambulatory Visit: Payer: Self-pay | Admitting: Nurse Practitioner

## 2018-06-05 ENCOUNTER — Other Ambulatory Visit: Payer: Self-pay | Admitting: Nurse Practitioner

## 2018-06-07 ENCOUNTER — Other Ambulatory Visit: Payer: Self-pay | Admitting: Nurse Practitioner

## 2018-06-08 DIAGNOSIS — C678 Malignant neoplasm of overlapping sites of bladder: Secondary | ICD-10-CM | POA: Diagnosis not present

## 2018-07-16 DIAGNOSIS — M170 Bilateral primary osteoarthritis of knee: Secondary | ICD-10-CM | POA: Diagnosis not present

## 2018-08-06 ENCOUNTER — Telehealth: Payer: Self-pay | Admitting: Interventional Cardiology

## 2018-08-06 NOTE — Telephone Encounter (Signed)
Spoke with pt about 7/21 appt.  He was agreeable to move appt up to 6/22.  Pt screened for COVID.      COVID-19 Pre-Screening Questions:  . In the past 7 to 10 days have you had a cough,  shortness of breath, headache, congestion, fever (100 or greater) body aches, chills, sore throat, or sudden loss of taste or sense of smell? No . Have you been around anyone with known Covid 19. . Have you been around anyone who is awaiting Covid 19 test results in the past 7 to 10 days? No . Have you been around anyone who has been exposed to Covid 19, or has mentioned symptoms of Covid 19 within the past 7 to 10 days? No  If you have any concerns/questions about symptoms patients report during screening (either on the phone or at threshold). Contact the provider seeing the patient or DOD for further guidance.  If neither are available contact a member of the leadership team.

## 2018-08-07 ENCOUNTER — Other Ambulatory Visit: Payer: Self-pay | Admitting: Nurse Practitioner

## 2018-08-08 NOTE — Progress Notes (Signed)
Cardiology Office Note:    Date:  08/09/2018   ID:  Shawn [name], DOB [birth year], MRN [id number]  PCP:  [name], MD  Cardiologist:  Sinclair Grooms, MD   Referring MD: [name], MD   Chief Complaint  Patient presents with  . Atrial Fibrillation  . Coronary Artery Disease    History of Present Illness:    Shawn [name] is a 83 y.o. male with a hx of  history of paroxysmal atrial fib, sinus bradycardia (HR 59 in 2015), CAD (s/p CABG in 1992, stenting to Cx 2004/2005, BMS-SVG-LAD 2008, MI 2011 with stents to the SVG to LAD, repeat CABG 2012, ischemic cardiomyopathy (EF 45-50% by cath 2015), hypertension, hyperlipidemia, and history of remote bladder cancer. Continues to smoke cigars.  We have not seen each other in 2 years.  He was seen last year by Truitt Merle 1 year ago.  Overall he feels that no significant problems are occurring.  He stopped playing golf about 9 months ago and restarted this March.  He noted decreased energy but as he has had repetitions, the endurance is improving.  He has occasional palpitation.  He denies angina.  He denies orthopnea and PND.  He has noticed swelling in the left lower extremity greater than the right.  He believes this is chronic.  He has not needed to use nitroglycerin.  He has not had syncope or near syncope.  Past Medical History:  Diagnosis Date  . Anterior myocardial infarction ([place]) 09/2009   [name] 04/22/2010  . Arthritis    "knees" (11/29/2013)  . Bladder cancer ([place]) 2007  . Chronic systolic CHF (congestive heart failure) ([place])   . Coronary artery disease    a. s/p CABG in 1992. b. stenting to Cx 2004/2005. c. BMS-SVG-LAD 2008. d. MI 2011 s/p stents to SVG-LAD. e. redo CABGx2 in 2012. f. [place] 2015: med rx.  . Essential hypertension   . Hyperglycemia   . Hyperlipidemia   . Hypotension    a. ACEI stopped 09/2015 due to this.  . Ischemic cardiomyopathy    a. EF 40-45% by [place] 2015.  Marland Kitchen PAF (paroxysmal atrial  fibrillation) ([place])   . Sinus bradycardia    a. HR 50s on prior EKG.    Past Surgical History:  Procedure Laterality Date  . APPENDECTOMY  1940's  . CARDIAC CATHETERIZATION  09/2006   [name] 06/20/2010  . CATARACT EXTRACTION W/ INTRAOCULAR LENS  IMPLANT, BILATERAL Bilateral 1990's  . CORONARY ANGIOPLASTY WITH STENT PLACEMENT  "several since 1992"  . CORONARY ARTERY BYPASS GRAFT  1992; ~2012   "CABG X3; CABH X 3"  . JOINT REPLACEMENT    . LEFT HEART CATHETERIZATION WITH CORONARY/GRAFT ANGIOGRAM N/A 11/30/2013   Procedure: LEFT HEART CATHETERIZATION WITH Beatrix Fetters;  Surgeon: Troy Sine, MD;  Location: [facility] CATH LAB;  Service: Cardiovascular;  Laterality: N/A;  . TONSILLECTOMY  1940's  . TOTAL HIP ARTHROPLASTY Left 1992  . TOTAL HIP ARTHROPLASTY Right ~ 1994    Current Medications: Current Meds  Medication Sig  . apixaban (ELIQUIS) 5 MG TABS tablet Take 1 tablet (5 mg total) by mouth 2 (two) times daily.  Marland Kitchen atenolol (TENORMIN) 25 MG tablet TAKE 1 TABLET BY MOUTH EVERY DAY  . atorvastatin (LIPITOR) 80 MG tablet Take 1 tablet (80 mg total) by mouth daily at 6 PM. TAKE 1 TABLET BY MOUTH ONCE DAILY AT 6 PM  . isosorbide mononitrate (IMDUR) 30 MG 24 hr tablet TAKE 1 TABLET BY  MOUTH ONCE DAILY  . nitroGLYCERIN (NITROSTAT) 0.4 MG SL tablet Place 1 tablet (0.4 mg total) under the tongue every 5 (five) minutes x 3 doses as needed for chest pain.     Allergies:   Penicillins   Social History   Socioeconomic History  . Marital status: Married    Spouse name: Not on file  . Number of children: Not on file  . Years of education: Not on file  . Highest education level: Not on file  Occupational History  . Not on file  Social Needs  . Financial resource strain: Not on file  . Food insecurity    Worry: Not on file    Inability: Not on file  . Transportation needs    Medical: Not on file    Non-medical: Not on file  Tobacco Use  . Smoking status: Light Tobacco Smoker     Years: 58.00    Types: Cigars, Cigarettes    Start date: 02/17/1973  . Smokeless tobacco: Never Used  . Tobacco comment: 11/29/2013 "I smoke 1 cigar/day now; stopped smoking cigarettes"  Substance and Sexual Activity  . Alcohol use: No  . Drug use: No  . Sexual activity: Yes  Lifestyle  . Physical activity    Days per week: Not on file    Minutes per session: Not on file  . Stress: Not on file  Relationships  . Social Herbalist on phone: Not on file    Gets together: Not on file    Attends religious service: Not on file    Active member of club or organization: Not on file    Attends meetings of clubs or organizations: Not on file    Relationship status: Not on file  Other Topics Concern  . Not on file  Social History Narrative  . Not on file     Family History: The patient's family history includes Heart disease in his father.  ROS:   Please see the history of present illness.    His arthritis continues to give him trouble.  These are mostly the joints in his legs and hips.  All other systems reviewed and are negative.  EKGs/Labs/Other Studies Reviewed:    The following studies were reviewed today: No new functional data or imaging data  EKG:  EKG is performed today and has been personally reviewed revealing atrial fibrillation/flutter with controlled ventricular response at 65 bpm.  QS pattern in V1 through V3.  When compared to the EKG tracing from May 06, 2017, atrial flutter is new.  Recent Labs: No results found for requested labs within last 8760 hours.  Recent Lipid Panel    Component Value Date/Time   CHOL 121 11/30/2013 0400   TRIG 92 11/30/2013 0400   HDL 33 (L) 11/30/2013 0400   CHOLHDL 3.7 11/30/2013 0400   VLDL 18 11/30/2013 0400   LDLCALC 70 11/30/2013 0400    Physical Exam:    VS:  BP 122/62   Pulse 65   Ht 6\' 4"  (1.93 m)   Wt 192 lb 3.2 oz (87.2 kg)   SpO2 95%   BMI 23.40 kg/m     Wt Readings from Last 3 Encounters:   08/09/18 192 lb 3.2 oz (87.2 kg)  05/06/17 190 lb 1.9 oz (86.2 kg)  03/28/16 189 lb 12.8 oz (86.1 kg)     GEN: Slender, elderly, but in No acute distress HEENT: Normal NECK: No JVD. LYMPHATICS: No lymphadenopathy CARDIAC: Irregularly irregular RR.  No murmur, no gallop, 1-2+ left ankle and trace to 1+ right ankle edema VASCULAR: 2+ bilateral radial pulses, no bruits RESPIRATORY:  Clear to auscultation without rales, wheezing or rhonchi  ABDOMEN: Soft, non-tender, non-distended, No pulsatile mass, MUSCULOSKELETAL: No deformity  SKIN: Warm and dry NEUROLOGIC:  Alert and oriented x 3 PSYCHIATRIC:  Normal affect   ASSESSMENT:    1. Coronary artery disease of bypass graft of native heart with stable angina pectoris ([place])   2. PAF (paroxysmal atrial fibrillation) ([place])   3. Essential hypertension   4. Mixed hyperlipidemia   5. Chronic systolic CHF (congestive heart failure) ([place])   6. Ischemic cardiomyopathy   7. Educated About Covid-19 Virus Infection    PLAN:    In order of problems listed above:  1. Denies angina.  The EKG does not reveal any ischemic findings. 2. Is on apixaban therapy.  Perform a 24-hour Holter to assess heart rate control and whether atrial flutter is continuous or paroxysmal.  Flutter is new compared to last year. 3. Excellent blood pressure control.  Target 130/80 mmHg. 4. No recent lipid panel is available for review.  A lipid panel will be obtained today. 5. No evidence of volume overload or heart failure.  No recent assessment of LV function.  Given his overall clinical condition and age, will defer.  Overall education and awareness concerning primary/secondary risk prevention was discussed in detail: LDL less than 70, hemoglobin A1c less than 7, blood pressure target less than 130/80 mmHg, >150 minutes of moderate aerobic activity per week, avoidance of smoking, weight control (via diet and exercise), and continued surveillance/management of/for  obstructive sleep apnea.    Medication Adjustments/Labs and Tests Ordered: Current medicines are reviewed at length with the patient today.  Concerns regarding medicines are outlined above.  Orders Placed This Encounter  Procedures  . EKG 12-Lead   No orders of the defined types were placed in this encounter.   There are no Patient Instructions on file for this visit.   Signed, Sinclair Grooms, MD  08/09/2018 8:57 AM    [place]

## 2018-08-09 ENCOUNTER — Ambulatory Visit (INDEPENDENT_AMBULATORY_CARE_PROVIDER_SITE_OTHER): Payer: Medicare Other | Admitting: Interventional Cardiology

## 2018-08-09 ENCOUNTER — Encounter: Payer: Self-pay | Admitting: Interventional Cardiology

## 2018-08-09 ENCOUNTER — Other Ambulatory Visit: Payer: Self-pay

## 2018-08-09 VITALS — BP 122/62 | HR 65 | Ht 76.0 in | Wt 192.2 lb

## 2018-08-09 DIAGNOSIS — I10 Essential (primary) hypertension: Secondary | ICD-10-CM | POA: Diagnosis not present

## 2018-08-09 DIAGNOSIS — I5022 Chronic systolic (congestive) heart failure: Secondary | ICD-10-CM | POA: Diagnosis not present

## 2018-08-09 DIAGNOSIS — I25708 Atherosclerosis of coronary artery bypass graft(s), unspecified, with other forms of angina pectoris: Secondary | ICD-10-CM | POA: Diagnosis not present

## 2018-08-09 DIAGNOSIS — E782 Mixed hyperlipidemia: Secondary | ICD-10-CM | POA: Diagnosis not present

## 2018-08-09 DIAGNOSIS — I480 Paroxysmal atrial fibrillation: Secondary | ICD-10-CM | POA: Diagnosis not present

## 2018-08-09 DIAGNOSIS — Z7189 Other specified counseling: Secondary | ICD-10-CM | POA: Diagnosis not present

## 2018-08-09 DIAGNOSIS — I255 Ischemic cardiomyopathy: Secondary | ICD-10-CM | POA: Diagnosis not present

## 2018-08-09 LAB — BASIC METABOLIC PANEL
BUN/Creatinine Ratio: 17 (ref 10–24)
BUN: 16 mg/dL (ref 8–27)
CO2: 26 mmol/L (ref 20–29)
Calcium: 8.7 mg/dL (ref 8.6–10.2)
Chloride: 103 mmol/L (ref 96–106)
Creatinine, Ser: 0.96 mg/dL (ref 0.76–1.27)
GFR calc Af Amer: 86 mL/min/{1.73_m2} (ref >59)
GFR calc non Af Amer: 74 mL/min/{1.73_m2} (ref >59)
Glucose: 185 mg/dL — ABNORMAL HIGH (ref 65–99)
Potassium: 3.9 mmol/L (ref 3.5–5.2)
Sodium: 141 mmol/L (ref 134–144)

## 2018-08-09 LAB — HEPATIC FUNCTION PANEL
ALT: 18 IU/L (ref 0–44)
AST: 22 IU/L (ref 0–40)
Albumin: 3.3 g/dL — ABNORMAL LOW (ref 3.7–4.7)
Alkaline Phosphatase: 150 IU/L — ABNORMAL HIGH (ref 39–117)
Bilirubin Total: 0.6 mg/dL (ref 0.0–1.2)
Bilirubin, Direct: 0.23 mg/dL (ref 0.00–0.40)
Total Protein: 6.1 g/dL (ref 6.0–8.5)

## 2018-08-09 LAB — CBC
Hematocrit: 39.3 % (ref 37.5–51.0)
Hemoglobin: 12.8 g/dL — ABNORMAL LOW (ref 13.0–17.7)
MCH: 31.5 pg (ref 26.6–33.0)
MCHC: 32.6 g/dL (ref 31.5–35.7)
MCV: 97 fL (ref 79–97)
Platelets: 190 10*3/uL (ref 150–450)
RBC: 4.06 x10E6/uL — ABNORMAL LOW (ref 4.14–5.80)
RDW: 12.3 % (ref 11.6–15.4)
WBC: 8 10*3/uL (ref 3.4–10.8)

## 2018-08-09 LAB — LIPID PANEL
Chol/HDL Ratio: 2.6 ratio (ref 0.0–5.0)
Cholesterol, Total: 98 mg/dL — ABNORMAL LOW (ref 100–199)
HDL: 37 mg/dL — ABNORMAL LOW (ref >39)
LDL Calculated: 39 mg/dL (ref 0–99)
Triglycerides: 109 mg/dL (ref 0–149)
VLDL Cholesterol Cal: 22 mg/dL (ref 5–40)

## 2018-08-09 NOTE — Telephone Encounter (Signed)
Pt last saw Shawn Merle, NP on 05/06/17. Pt is overdue for follow-up with Dr Shawn [name], pt has upcoming appt scheduled for 08/09/18.  Last labs 03/30/17 Creat 0.82 at [facility], pt is also overdue for bloodwork.  Will place note on appt to draw CBC and CMP at [facility].  Age 83, weight 86.2kg on 05/06/17, need upto date weight as well.  Based on specified criteria pt is on appropriate dosage of Eliquis 5mg  BID.

## 2018-08-09 NOTE — Patient Instructions (Addendum)
Medication Instructions:  Your physician recommends that you continue on your current medications as directed. Please refer to the Current Medication list given to you today.  If you need a refill on your cardiac medications before your next appointment, please call your pharmacy.   Lab work: BMET, Liver, Lipid and CBC today  If you have labs (blood work) drawn today and your tests are completely normal, you will receive your results only by: Marland Kitchen MyChart Message (if you have MyChart) OR . A paper copy in the mail If you have any lab test that is abnormal or we need to change your treatment, we will call you to review the results.  Testing/Procedures: Your physician has recommended that you wear a holter monitor. Holter monitors are medical devices that record the heart's electrical activity. Doctors most often use these monitors to diagnose arrhythmias. Arrhythmias are problems with the speed or rhythm of the heartbeat. The monitor is a small, portable device. You can wear one while you do your normal daily activities. This is usually used to diagnose what is causing palpitations/syncope (passing out).   Follow-Up: At [facility], you and your health needs are our priority.  As part of our continuing mission to provide you with exceptional heart care, we have created designated Provider Care Teams.  These Care Teams include your primary Cardiologist (physician) and Advanced Practice Providers (APPs -  Physician Assistants and Nurse Practitioners) who all work together to provide you with the care you need, when you need it. You will need a follow up appointment in 12 months.  Please call our office 2 months in advance to schedule this appointment.  You may see Sinclair Grooms, MD or one of the following Advanced Practice Providers on your designated Care Team:   Truitt Merle, NP Cecilie Kicks, NP . Kathyrn Drown, NP  Any Other Special Instructions Will Be Listed Below (If  Applicable).

## 2018-08-10 DIAGNOSIS — Z961 Presence of intraocular lens: Secondary | ICD-10-CM | POA: Diagnosis not present

## 2018-08-10 NOTE — Telephone Encounter (Signed)
Pt last saw Dr Tamala Julian 08/09/18, last labs 08/09/18 Creat 0.96, age 83, weight 87.2kg, based on specified criteria pt is on appropriate dosage of Eliquis 5mg  BID.  Will refill rx.

## 2018-09-07 ENCOUNTER — Ambulatory Visit: Payer: Medicare Other | Admitting: Interventional Cardiology

## 2018-09-07 DIAGNOSIS — R7303 Prediabetes: Secondary | ICD-10-CM | POA: Diagnosis not present

## 2018-09-07 DIAGNOSIS — Z0000 Encounter for general adult medical examination without abnormal findings: Secondary | ICD-10-CM | POA: Diagnosis not present

## 2018-09-07 DIAGNOSIS — E7800 Pure hypercholesterolemia, unspecified: Secondary | ICD-10-CM | POA: Diagnosis not present

## 2018-09-07 DIAGNOSIS — Z8551 Personal history of malignant neoplasm of bladder: Secondary | ICD-10-CM | POA: Diagnosis not present

## 2018-09-07 DIAGNOSIS — I25769 Atherosclerosis of bypass graft of coronary artery of transplanted heart with unspecified angina pectoris: Secondary | ICD-10-CM | POA: Diagnosis not present

## 2018-09-07 DIAGNOSIS — M109 Gout, unspecified: Secondary | ICD-10-CM | POA: Diagnosis not present

## 2018-09-07 DIAGNOSIS — Z1389 Encounter for screening for other disorder: Secondary | ICD-10-CM | POA: Diagnosis not present

## 2018-09-07 DIAGNOSIS — I480 Paroxysmal atrial fibrillation: Secondary | ICD-10-CM | POA: Diagnosis not present

## 2018-09-07 DIAGNOSIS — I10 Essential (primary) hypertension: Secondary | ICD-10-CM | POA: Diagnosis not present

## 2018-09-07 DIAGNOSIS — D6869 Other thrombophilia: Secondary | ICD-10-CM | POA: Diagnosis not present

## 2018-09-07 DIAGNOSIS — I5022 Chronic systolic (congestive) heart failure: Secondary | ICD-10-CM | POA: Diagnosis not present

## 2018-09-07 DIAGNOSIS — M170 Bilateral primary osteoarthritis of knee: Secondary | ICD-10-CM | POA: Diagnosis not present

## 2018-09-26 ENCOUNTER — Other Ambulatory Visit: Payer: Self-pay | Admitting: Nurse Practitioner

## 2018-09-30 DIAGNOSIS — M109 Gout, unspecified: Secondary | ICD-10-CM | POA: Diagnosis not present

## 2018-09-30 DIAGNOSIS — R7309 Other abnormal glucose: Secondary | ICD-10-CM | POA: Diagnosis not present

## 2018-09-30 DIAGNOSIS — Z23 Encounter for immunization: Secondary | ICD-10-CM | POA: Diagnosis not present

## 2018-09-30 DIAGNOSIS — R7303 Prediabetes: Secondary | ICD-10-CM | POA: Diagnosis not present

## 2018-09-30 DIAGNOSIS — I10 Essential (primary) hypertension: Secondary | ICD-10-CM | POA: Diagnosis not present

## 2018-10-27 DIAGNOSIS — Z23 Encounter for immunization: Secondary | ICD-10-CM | POA: Diagnosis not present

## 2018-12-02 ENCOUNTER — Other Ambulatory Visit: Payer: Self-pay | Admitting: Nurse Practitioner

## 2018-12-07 ENCOUNTER — Telehealth: Payer: Self-pay | Admitting: Interventional Cardiology

## 2018-12-07 DIAGNOSIS — I480 Paroxysmal atrial fibrillation: Secondary | ICD-10-CM

## 2018-12-07 NOTE — Telephone Encounter (Signed)
3 day ZIO monitor mailed to pt's home. Pt aware.

## 2018-12-07 NOTE — Telephone Encounter (Signed)
[name], MD  Loren Racer, RN        Thanks Anderson Malta. 3 day Zio Patch is a good idea.   Left message for pt to call back.  Need to advise pt of monitor order and verify address so it can be mailed.

## 2018-12-07 NOTE — Telephone Encounter (Signed)
Spoke with pt and made him aware of order for 3 day monitor.  Verified address is correct that is in the chart.  Pt aware if any questions about monitor when he receives it, reach out to the company and number is on the box.  Pt verbalized understanding and was appreciative for call.

## 2018-12-14 DIAGNOSIS — I25110 Atherosclerotic heart disease of native coronary artery with unstable angina pectoris: Secondary | ICD-10-CM | POA: Diagnosis not present

## 2018-12-14 DIAGNOSIS — I480 Paroxysmal atrial fibrillation: Secondary | ICD-10-CM | POA: Diagnosis not present

## 2018-12-14 DIAGNOSIS — M170 Bilateral primary osteoarthritis of knee: Secondary | ICD-10-CM | POA: Diagnosis not present

## 2018-12-14 DIAGNOSIS — I5022 Chronic systolic (congestive) heart failure: Secondary | ICD-10-CM | POA: Diagnosis not present

## 2018-12-14 DIAGNOSIS — I252 Old myocardial infarction: Secondary | ICD-10-CM | POA: Diagnosis not present

## 2018-12-14 DIAGNOSIS — I2510 Atherosclerotic heart disease of native coronary artery without angina pectoris: Secondary | ICD-10-CM | POA: Diagnosis not present

## 2018-12-14 DIAGNOSIS — I25769 Atherosclerosis of bypass graft of coronary artery of transplanted heart with unspecified angina pectoris: Secondary | ICD-10-CM | POA: Diagnosis not present

## 2018-12-14 DIAGNOSIS — E785 Hyperlipidemia, unspecified: Secondary | ICD-10-CM | POA: Diagnosis not present

## 2018-12-14 DIAGNOSIS — E7800 Pure hypercholesterolemia, unspecified: Secondary | ICD-10-CM | POA: Diagnosis not present

## 2018-12-14 DIAGNOSIS — I25118 Atherosclerotic heart disease of native coronary artery with other forms of angina pectoris: Secondary | ICD-10-CM | POA: Diagnosis not present

## 2018-12-14 DIAGNOSIS — I10 Essential (primary) hypertension: Secondary | ICD-10-CM | POA: Diagnosis not present

## 2019-01-21 DIAGNOSIS — M170 Bilateral primary osteoarthritis of knee: Secondary | ICD-10-CM | POA: Diagnosis not present

## 2019-01-21 DIAGNOSIS — M1711 Unilateral primary osteoarthritis, right knee: Secondary | ICD-10-CM | POA: Diagnosis not present

## 2019-01-21 DIAGNOSIS — M25562 Pain in left knee: Secondary | ICD-10-CM | POA: Diagnosis not present

## 2019-01-21 DIAGNOSIS — M1712 Unilateral primary osteoarthritis, left knee: Secondary | ICD-10-CM | POA: Diagnosis not present

## 2019-01-21 DIAGNOSIS — M25561 Pain in right knee: Secondary | ICD-10-CM | POA: Diagnosis not present

## 2019-01-28 DIAGNOSIS — M170 Bilateral primary osteoarthritis of knee: Secondary | ICD-10-CM | POA: Diagnosis not present

## 2019-01-28 DIAGNOSIS — M25561 Pain in right knee: Secondary | ICD-10-CM | POA: Diagnosis not present

## 2019-01-28 DIAGNOSIS — M25562 Pain in left knee: Secondary | ICD-10-CM | POA: Diagnosis not present

## 2019-03-14 DIAGNOSIS — M170 Bilateral primary osteoarthritis of knee: Secondary | ICD-10-CM | POA: Diagnosis not present

## 2019-03-14 DIAGNOSIS — I10 Essential (primary) hypertension: Secondary | ICD-10-CM | POA: Diagnosis not present

## 2019-03-14 DIAGNOSIS — I480 Paroxysmal atrial fibrillation: Secondary | ICD-10-CM | POA: Diagnosis not present

## 2019-03-14 DIAGNOSIS — I25769 Atherosclerosis of bypass graft of coronary artery of transplanted heart with unspecified angina pectoris: Secondary | ICD-10-CM | POA: Diagnosis not present

## 2019-03-14 DIAGNOSIS — I2510 Atherosclerotic heart disease of native coronary artery without angina pectoris: Secondary | ICD-10-CM | POA: Diagnosis not present

## 2019-03-14 DIAGNOSIS — I252 Old myocardial infarction: Secondary | ICD-10-CM | POA: Diagnosis not present

## 2019-03-14 DIAGNOSIS — E785 Hyperlipidemia, unspecified: Secondary | ICD-10-CM | POA: Diagnosis not present

## 2019-03-14 DIAGNOSIS — I25110 Atherosclerotic heart disease of native coronary artery with unstable angina pectoris: Secondary | ICD-10-CM | POA: Diagnosis not present

## 2019-03-14 DIAGNOSIS — I25118 Atherosclerotic heart disease of native coronary artery with other forms of angina pectoris: Secondary | ICD-10-CM | POA: Diagnosis not present

## 2019-03-14 DIAGNOSIS — I5022 Chronic systolic (congestive) heart failure: Secondary | ICD-10-CM | POA: Diagnosis not present

## 2019-03-14 DIAGNOSIS — E7800 Pure hypercholesterolemia, unspecified: Secondary | ICD-10-CM | POA: Diagnosis not present

## 2019-04-17 ENCOUNTER — Other Ambulatory Visit: Payer: Self-pay | Admitting: Nurse Practitioner

## 2019-04-18 ENCOUNTER — Other Ambulatory Visit: Payer: Self-pay | Admitting: Nurse Practitioner

## 2019-05-16 ENCOUNTER — Other Ambulatory Visit: Payer: Self-pay

## 2019-05-16 MED ORDER — APIXABAN 5 MG PO TABS: 5.0000 mg | ORAL_TABLET | Freq: Two times a day (BID) | ORAL | 1 refills | Status: DC

## 2019-05-16 NOTE — Telephone Encounter (Signed)
Prescription refill request for Eliquis received.  Last office visit: Shawn [name] 08/09/2018 Scr: 0.96, 08/09/2018 Age: 84 y.o. Weight: 87.2 kg   Prescription refill sent.

## 2019-05-19 NOTE — Telephone Encounter (Signed)
[employer] has not received monitor back from patient. They have attempted to contact the patient on 3 occasions to request monitor be returned to [employer].  CANCELLED ORDER 

## 2019-05-30 DIAGNOSIS — I252 Old myocardial infarction: Secondary | ICD-10-CM | POA: Diagnosis not present

## 2019-05-30 DIAGNOSIS — I480 Paroxysmal atrial fibrillation: Secondary | ICD-10-CM | POA: Diagnosis not present

## 2019-05-30 DIAGNOSIS — E785 Hyperlipidemia, unspecified: Secondary | ICD-10-CM | POA: Diagnosis not present

## 2019-05-30 DIAGNOSIS — I10 Essential (primary) hypertension: Secondary | ICD-10-CM | POA: Diagnosis not present

## 2019-05-30 DIAGNOSIS — I25118 Atherosclerotic heart disease of native coronary artery with other forms of angina pectoris: Secondary | ICD-10-CM | POA: Diagnosis not present

## 2019-05-30 DIAGNOSIS — I25110 Atherosclerotic heart disease of native coronary artery with unstable angina pectoris: Secondary | ICD-10-CM | POA: Diagnosis not present

## 2019-05-30 DIAGNOSIS — I25769 Atherosclerosis of bypass graft of coronary artery of transplanted heart with unspecified angina pectoris: Secondary | ICD-10-CM | POA: Diagnosis not present

## 2019-05-30 DIAGNOSIS — I2510 Atherosclerotic heart disease of native coronary artery without angina pectoris: Secondary | ICD-10-CM | POA: Diagnosis not present

## 2019-05-30 DIAGNOSIS — E7800 Pure hypercholesterolemia, unspecified: Secondary | ICD-10-CM | POA: Diagnosis not present

## 2019-05-30 DIAGNOSIS — M170 Bilateral primary osteoarthritis of knee: Secondary | ICD-10-CM | POA: Diagnosis not present

## 2019-05-30 DIAGNOSIS — I5022 Chronic systolic (congestive) heart failure: Secondary | ICD-10-CM | POA: Diagnosis not present

## 2019-06-13 DIAGNOSIS — Z8551 Personal history of malignant neoplasm of bladder: Secondary | ICD-10-CM | POA: Diagnosis not present

## 2019-09-12 NOTE — Progress Notes (Signed)
Cardiology Office Note:    Date:  09/14/2019   ID:  Shawn [name], DOB [birth year], MRN [id number]  PCP:  [name], MD  Cardiologist:  Sinclair Grooms, MD   Referring MD: [name], MD   Chief Complaint  Patient presents with  . Coronary Artery Disease    History of Present Illness:    Shawn [name] is a 84 y.o. male with a hx of paroxysmal atrial fib, sinus bradycardia (HR 59 in 2015), CAD (s/p CABG in 1992, stenting to Cx 2004/2005, BMS-SVG-LAD 2008, MI 2011 with stents to the SVG to LAD, repeat CABG 2012, ischemic cardiomyopathy (EF 45-50% by cath 2015), hypertension, hyperlipidemia, and history of remote bladder cancer. Continues to smoke cigars.  Shawn [name] is doing well.  He is not having orthopnea, edema, palpitations, or syncope.  He has not needed sublingual nitroglycerin.  He is still playing golf.  His appetite is good.  Past Medical History:  Diagnosis Date  . Anterior myocardial infarction ([place]) 09/2009   [name] 04/22/2010  . Arthritis    "knees" (11/29/2013)  . Bladder cancer ([place]) 2007  . Chronic systolic CHF (congestive heart failure) ([place])   . Coronary artery disease    a. s/p CABG in 1992. b. stenting to Cx 2004/2005. c. BMS-SVG-LAD 2008. d. MI 2011 s/p stents to SVG-LAD. e. redo CABGx2 in 2012. f. [place] 2015: med rx.  . Essential hypertension   . Hyperglycemia   . Hyperlipidemia   . Hypotension    a. ACEI stopped 09/2015 due to this.  . Ischemic cardiomyopathy    a. EF 40-45% by [place] 2015.  Marland Kitchen PAF (paroxysmal atrial fibrillation) ([place])   . Sinus bradycardia    a. HR 50s on prior EKG.    Past Surgical History:  Procedure Laterality Date  . APPENDECTOMY  1940's  . CARDIAC CATHETERIZATION  09/2006   [name] 06/20/2010  . CATARACT EXTRACTION W/ INTRAOCULAR LENS  IMPLANT, BILATERAL Bilateral 1990's  . CORONARY ANGIOPLASTY WITH STENT PLACEMENT  "several since 1992"  . CORONARY ARTERY BYPASS GRAFT  1992; ~2012   "CABG X3; CABH X 3"  . JOINT  REPLACEMENT    . LEFT HEART CATHETERIZATION WITH CORONARY/GRAFT ANGIOGRAM N/A 11/30/2013   Procedure: LEFT HEART CATHETERIZATION WITH Beatrix Fetters;  Surgeon: Troy Sine, MD;  Location[facility] C[facility] LAB;  Service: Cardiovascular;  Laterality: N/A;  . TONSILLECTOMY  1940's  . TOTAL HIP ARTHROPLASTY Left 1992  . TOTAL HIP ARTHROPLASTY Right ~ 1994    Current Medications: Current Meds  Medication Sig  . apixaban (ELIQUIS) 5 MG TABS tablet Take 1 tablet (5 mg total) by mouth 2 (two) times daily.  Marland Kitchen atenolol (TENORMIN) 25 MG tablet TAKE 1 TABLET BY MOUTH EVERY DAY  . atorvastatin (LIPITOR) 80 MG tablet TAKE 1 TABLET(80 MG) BY MOUTH EVERY DAY AT 6 PM  . isosorbide mononitrate (IMDUR) 30 MG 24 hr tablet TAKE 1 TABLET BY MOUTH EVERY DAY  . nitroGLYCERIN (NITROSTAT) 0.4 MG SL tablet Place 1 tablet (0.4 mg total) under the tongue every 5 (five) minutes x 3 doses as needed for chest pain.     Allergies:   Penicillins   Social History   Socioeconomic History  . Marital status: Married    Spouse name: Not on file  . Number of children: Not on file  . Years of education: Not on file  . Highest education level: Not on file  Occupational History  . Not on file  Tobacco Use  .  Smoking status: Light Tobacco Smoker    Years: 58.00    Types: Cigars, Cigarettes    Start date: 02/17/1973  . Smokeless tobacco: Never Used  . Tobacco comment: 11/29/2013 "I smoke 1 cigar/day now; stopped smoking cigarettes"  Vaping Use  . Vaping Use: Never used  Substance and Sexual Activity  . Alcohol use: No  . Drug use: No  . Sexual activity: Yes  Other Topics Concern  . Not on file  Social History Narrative  . Not on file   Social Determinants of Health   Financial Resource Strain:   . Difficulty of Paying Living Expenses:   Food Insecurity:   . Worried About Charity fundraiser in the Last Year:   . Arboriculturist in the Last Year:   Transportation Needs:   . Film/video editor  (Medical):   Marland Kitchen Lack of Transportation (Non-Medical):   Physical Activity:   . Days of Exercise per Week:   . Minutes of Exercise per Session:   Stress:   . Feeling of Stress :   Social Connections:   . Frequency of Communication with Friends and Family:   . Frequency of Social Gatherings with Friends and Family:   . Attends Religious Services:   . Active Member of Clubs or Organizations:   . Attends Archivist Meetings:   Marland Kitchen Marital Status:      Family History: The patient's family history includes Heart disease in his father.  ROS:   Please see the history of present illness.    Having difficulty with both knees now.  He has had bilateral hip replacement.  But as noted above, he is still playing golf and overall stable.  All other systems reviewed and are negative.  EKGs/Labs/Other Studies Reviewed:    The following studies were reviewed today: No recent data.  Has blood work ordered to be done in August with Dr. [name].  EKG:  EKG atrial flutter, controlled ventricular response, QS pattern V1 through V4.  When compared to the prior tracing performed June 2020, no changes noted.  Recent Labs: No results found for requested labs within last 8760 hours.  Recent Lipid Panel    Component Value Date/Time   CHOL 98 (L) 08/09/2018 0917   TRIG 109 08/09/2018 0917   HDL 37 (L) 08/09/2018 0917   CHOLHDL 2.6 08/09/2018 0917   CHOLHDL 3.7 11/30/2013 0400   VLDL 18 11/30/2013 0400   LDLCALC 39 08/09/2018 0917    Physical Exam:    VS:  BP 128/68   Pulse 63   Ht 6\' 4"  (1.93 m)   Wt 188 lb 3.2 oz (85.4 kg)   SpO2 97%   BMI 22.91 kg/m     Wt Readings from Last 3 Encounters:  09/14/19 188 lb 3.2 oz (85.4 kg)  08/09/18 192 lb 3.2 oz (87.2 kg)  05/06/17 190 lb 1.9 oz (86.2 kg)     GEN: Compatible with his age of 43.. No acute distress HEENT: Normal NECK: No JVD. LYMPHATICS: No lymphadenopathy CARDIAC:  RRR without murmur, gallop, or edema. VASCULAR:   Normal Pulses. No bruits. RESPIRATORY:  Clear to auscultation without rales, wheezing or rhonchi  ABDOMEN: Soft, non-tender, non-distended, No pulsatile mass, MUSCULOSKELETAL: No deformity  SKIN: Warm and dry NEUROLOGIC:  Alert and oriented x 3 PSYCHIATRIC:  Normal affect   ASSESSMENT:    1. Coronary artery disease of bypass graft of native heart with stable angina pectoris ([place])   2. PAF (  paroxysmal atrial fibrillation) ([place])   3. Essential hypertension   4. Mixed hyperlipidemia   5. Chronic systolic CHF (congestive heart failure) ([place])   6. Educated about COVID-19 virus infection    PLAN:    In order of problems listed above:  1. Discussed secondary prevention.  Encouraged continued aerobic activity totaling greater than 150 minutes/week.  Continue Tenormin, Imdur, and Lipitor. 2. Control rate and atrial flutter.  Long-term anticoagulation to prevent stroke.  No bleeding complications on Eliquis 5 mg twice daily. 3. Continue Tenormin 25 mg daily and Imdur 30 mg/day. 4. Continue Lipitor 80 mg/day.  He assures me that laboratory data will be done by Dr. Alyson Ingles next month.  We will review those labs rather than drawing labs today 5. No heart failure symptoms.  He is on beta-blocker and Imdur.  No recent cardiac imaging. 6. COVID-19 vaccine is been received.  Encouraged continued masking given delta variant Verel[place]'s and spread.  Overall education and awareness concerning primary/secondary risk prevention was discussed in detail: LDL less than 70, hemoglobin A1c less than 7, blood pressure target less than 130/80 mmHg, >150 minutes of moderate aerobic activity per week, avoidance of smoking, weight control (via diet and exercise), and continued surveillance/management of/for obstructive sleep apnea.    Medication Adjustments/Labs and Tests Ordered: Current medicines are reviewed at length with the patient today.  Concerns regarding medicines are outlined above.  Orders Placed This  Encounter  Procedures  . EKG 12-Lead   No orders of the defined types were placed in this encounter.   There are no Patient Instructions on file for this visit.   Signed, Sinclair Grooms, MD  09/14/2019 9:25 AM    [place]

## 2019-09-14 ENCOUNTER — Other Ambulatory Visit: Payer: Self-pay

## 2019-09-14 ENCOUNTER — Ambulatory Visit (INDEPENDENT_AMBULATORY_CARE_PROVIDER_SITE_OTHER): Payer: Medicare Other | Admitting: Interventional Cardiology

## 2019-09-14 ENCOUNTER — Encounter: Payer: Self-pay | Admitting: Interventional Cardiology

## 2019-09-14 VITALS — BP 128/68 | HR 63 | Ht 76.0 in | Wt 188.2 lb

## 2019-09-14 DIAGNOSIS — E7800 Pure hypercholesterolemia, unspecified: Secondary | ICD-10-CM | POA: Diagnosis not present

## 2019-09-14 DIAGNOSIS — M170 Bilateral primary osteoarthritis of knee: Secondary | ICD-10-CM | POA: Diagnosis not present

## 2019-09-14 DIAGNOSIS — I10 Essential (primary) hypertension: Secondary | ICD-10-CM

## 2019-09-14 DIAGNOSIS — E785 Hyperlipidemia, unspecified: Secondary | ICD-10-CM | POA: Diagnosis not present

## 2019-09-14 DIAGNOSIS — E782 Mixed hyperlipidemia: Secondary | ICD-10-CM

## 2019-09-14 DIAGNOSIS — Z7189 Other specified counseling: Secondary | ICD-10-CM | POA: Diagnosis not present

## 2019-09-14 DIAGNOSIS — I2510 Atherosclerotic heart disease of native coronary artery without angina pectoris: Secondary | ICD-10-CM | POA: Diagnosis not present

## 2019-09-14 DIAGNOSIS — I5022 Chronic systolic (congestive) heart failure: Secondary | ICD-10-CM | POA: Diagnosis not present

## 2019-09-14 DIAGNOSIS — I480 Paroxysmal atrial fibrillation: Secondary | ICD-10-CM

## 2019-09-14 DIAGNOSIS — I25110 Atherosclerotic heart disease of native coronary artery with unstable angina pectoris: Secondary | ICD-10-CM | POA: Diagnosis not present

## 2019-09-14 DIAGNOSIS — I25708 Atherosclerosis of coronary artery bypass graft(s), unspecified, with other forms of angina pectoris: Secondary | ICD-10-CM

## 2019-09-14 DIAGNOSIS — I252 Old myocardial infarction: Secondary | ICD-10-CM | POA: Diagnosis not present

## 2019-09-14 DIAGNOSIS — I25769 Atherosclerosis of bypass graft of coronary artery of transplanted heart with unspecified angina pectoris: Secondary | ICD-10-CM | POA: Diagnosis not present

## 2019-09-14 DIAGNOSIS — I25118 Atherosclerotic heart disease of native coronary artery with other forms of angina pectoris: Secondary | ICD-10-CM | POA: Diagnosis not present

## 2019-09-14 NOTE — Patient Instructions (Signed)
Medication Instructions:  Your physician recommends that you continue on your current medications as directed. Please refer to the Current Medication list given to you today.  *If you need a refill on your cardiac medications before your next appointment, please call your pharmacy*   Lab Work: None If you have labs (blood work) drawn today and your tests are completely normal, you will receive your results only by: Marland Kitchen MyChart Message (if you have MyChart) OR . A paper copy in the mail If you have any lab test that is abnormal or we need to change your treatment, we will call you to review the results.   Testing/Procedures: None   Follow-Up: At [facility], you and your health needs are our priority.  As part of our continuing mission to provide you with exceptional heart care, we have created designated Provider Care Teams.  These Care Teams include your primary Cardiologist (physician) and Advanced Practice Providers (APPs -  Physician Assistants and Nurse Practitioners) who all work together to provide you with the care you need, when you need it.    Your next appointment:   12 month(s)  The format for your next appointment:   In Person  Provider:   You may see Sinclair Grooms, MD or one of the following Advanced Practice Providers on your designated Care Team:    Truitt Merle, NP  Cecilie Kicks, NP  Kathyrn Drown, NP

## 2019-10-13 DIAGNOSIS — M1712 Unilateral primary osteoarthritis, left knee: Secondary | ICD-10-CM | POA: Diagnosis not present

## 2019-10-13 DIAGNOSIS — M1711 Unilateral primary osteoarthritis, right knee: Secondary | ICD-10-CM | POA: Diagnosis not present

## 2019-10-13 DIAGNOSIS — M170 Bilateral primary osteoarthritis of knee: Secondary | ICD-10-CM | POA: Diagnosis not present

## 2019-10-20 DIAGNOSIS — M1712 Unilateral primary osteoarthritis, left knee: Secondary | ICD-10-CM | POA: Diagnosis not present

## 2019-10-20 DIAGNOSIS — M170 Bilateral primary osteoarthritis of knee: Secondary | ICD-10-CM | POA: Diagnosis not present

## 2019-10-20 DIAGNOSIS — M1711 Unilateral primary osteoarthritis, right knee: Secondary | ICD-10-CM | POA: Diagnosis not present

## 2019-10-27 DIAGNOSIS — M170 Bilateral primary osteoarthritis of knee: Secondary | ICD-10-CM | POA: Diagnosis not present

## 2019-10-27 DIAGNOSIS — M1712 Unilateral primary osteoarthritis, left knee: Secondary | ICD-10-CM | POA: Diagnosis not present

## 2019-10-27 DIAGNOSIS — M1711 Unilateral primary osteoarthritis, right knee: Secondary | ICD-10-CM | POA: Diagnosis not present

## 2019-10-28 ENCOUNTER — Other Ambulatory Visit: Payer: Self-pay | Admitting: Nurse Practitioner

## 2019-10-28 DIAGNOSIS — Z23 Encounter for immunization: Secondary | ICD-10-CM | POA: Diagnosis not present

## 2019-10-28 DIAGNOSIS — K4090 Unilateral inguinal hernia, without obstruction or gangrene, not specified as recurrent: Secondary | ICD-10-CM | POA: Diagnosis not present

## 2019-11-18 DIAGNOSIS — Z23 Encounter for immunization: Secondary | ICD-10-CM | POA: Diagnosis not present

## 2019-11-24 ENCOUNTER — Ambulatory Visit: Payer: Self-pay | Admitting: Surgery

## 2019-11-24 DIAGNOSIS — Z7901 Long term (current) use of anticoagulants: Secondary | ICD-10-CM | POA: Diagnosis not present

## 2019-11-24 DIAGNOSIS — K4020 Bilateral inguinal hernia, without obstruction or gangrene, not specified as recurrent: Secondary | ICD-10-CM | POA: Diagnosis not present

## 2019-11-24 NOTE — H&P (Signed)
Shawn [name] Appointment: 11/24/2019 2:30 PM Location: [facility] Patient #: [id number] DOB: 1935-03-17 Widowed / Language: Shawn [name] / Race: White Male  History of Present Illness Shawn [name] Shawn [name]; 11/24/2019 5:23 PM) The patient is a 84 year old male who presents with an inguinal hernia. Note for "Inguinal hernia": ` ` ` Patient sent for surgical consultation at the request of Dr Shawn [name] at [facility]  Chief Complaint: Left groin swelling. Probable hernia. ` ` The patient is an elderly gentleman. History of hip replacements. Noticed some bulging in his groin. Discuss with orthopedic surgery. Recommended primary care follow-up. I noted at the [place] office. Inguinal hernia suspected. Surgical consultation offered. Patient has a history of atrial flutter and fibrillation anticoagulated on Eliquis. History coronary disease with bypass graft in 1992. Redo bypass grafting in 2012 by Dr. Cyndia [name]. Followed by Dr. Daneen [name]. Seen in the summer doing relatively well. Hyponatremia followed by primary care office.  Usually is rather active. Golf regularly. Works at [employer]. He's held off on being more active since his diagnosis. Can walk a mile easily without difficulty. Moves his bowels every day. No problems with urination or defecation. No prostate problems. It appendectomy at age 30 but no other abdominal surgeries. No sleep apnea. No diabetic. He does not smoke. No history of skin infections. Hard of hearing.  (Review of systems as stated in this history (HPI) or in the review of systems. Otherwise all other 12 point ROS are negative) ` ` ###########################################`  This patient encounter took 30 minutes today to perform the following: obtain history, perform exam, review outside records, interpret tests & imaging, counsel the patient on their diagnosis; and, document this encounter, including findings & plan in the electronic health  record (EHR).   Past Surgical History ([name], [place]; 11/24/2019 2:15 PM) Appendectomy Cataract Surgery Bilateral. Hip Surgery Bilateral.  Diagnostic Studies History ([name], [place]; 11/24/2019 2:15 PM) Colonoscopy >10 years ago  Allergies ([name], [place]; 11/24/2019 2:15 PM) Penicillins Allergies Reconciled  Medication History ([name], CMA; 11/24/2019 2:16 PM) Eliquis (5MG  Tablet, Oral) Active. Atenolol (25MG  Tablet, Oral) Active. Atorvastatin Calcium (80MG  Tablet, Oral) Active. Isosorbide Mononitrate ER (30MG  Tablet ER 24HR, Oral) Active. Nitrostat (0.4MG  Tab Sublingual, Sublingual) Active. Medications Reconciled  Social History Antonietta Jewel, CMA; 11/24/2019 2:15 PM) Alcohol use Occasional alcohol use. Caffeine use Coffee. No drug use Tobacco use Former smoker.  Family History ([place], [place]; 11/24/2019 2:15 PM) First Degree Relatives No pertinent family history  Other Problems ([name], [place]; 11/24/2019 2:15 PM) Myocardial infarction     Review of Systems ([name] CMA; 11/24/2019 2:15 PM) General Not Present- Appetite Loss, Chills, Fatigue, Fever, Night Sweats, Weight Gain and Weight Loss. Skin Not Present- Change in Wart/Mole, Dryness, Hives, Jaundice, New Lesions, Non-Healing Wounds, Rash and Ulcer. HEENT Not Present- Earache, Hearing Loss, Hoarseness, Nose Bleed, Oral Ulcers, Ringing in the Ears, Seasonal Allergies, Sinus Pain, Sore Throat, Visual Disturbances, Wears glasses/contact lenses and Yellow Eyes. Respiratory Not Present- Bloody sputum, Chronic Cough, Difficulty Breathing, Snoring and Wheezing. Breast Not Present- Breast Mass, Breast Pain, Nipple Discharge and Skin Changes. Cardiovascular Not Present- Chest Pain, Difficulty Breathing Lying Down, Leg Cramps, Palpitations, Rapid Heart Rate, Shortness of Breath and Swelling of Extremities. Gastrointestinal Not Present- Abdominal Pain, Bloating, Bloody Stool, Change in  Bowel Habits, Chronic diarrhea, Constipation, Difficulty Swallowing, Excessive gas, Gets full quickly at meals, Hemorrhoids, Indigestion, Nausea, Rectal Pain and Vomiting. Male Genitourinary Not Present- Blood in Urine, Change in Urinary  Stream, Frequency, Impotence, Nocturia, Painful Urination, Urgency and Urine Leakage. Musculoskeletal Not Present- Back Pain, Joint Pain, Joint Stiffness, Muscle Pain, Muscle Weakness and Swelling of Extremities. Neurological Not Present- Decreased Memory, Fainting, Headaches, Numbness, Seizures, Tingling, Tremor, Trouble walking and Weakness. Psychiatric Not Present- Anxiety, Bipolar, Change in Sleep Pattern, Depression, Fearful and Frequent crying. Endocrine Not Present- Cold Intolerance, Excessive Hunger, Hair Changes, Heat Intolerance, Hot flashes and New Diabetes. Hematology Not Present- Blood Thinners, Easy Bruising, Excessive bleeding, Gland problems, HIV and Persistent Infections.  Vitals ([name] CMA; 11/24/2019 2:16 PM) 11/24/2019 2:16 PM Weight: 191.13 lb Height: 75in Body Surface Area: 2.15 m Body Mass Index: 23.89 kg/m  Temp.: 98.64F  Pulse: 25 (Regular)  BP: 118/74(Sitting, Left Arm, Standard)        Physical Exam Shawn [name] Shawn [name]; 11/24/2019 2:55 PM)  General Mental Status-Alert. General Appearance-Not in acute distress, Not Sickly. Orientation-Oriented X3. Hydration-Well hydrated. Voice-Normal.  Integumentary Global Assessment Upon inspection and palpation of skin surfaces of the - Axillae: non-tender, no inflammation or ulceration, no drainage. and Distribution of scalp and body hair is normal. General Characteristics Temperature - normal warmth is noted.  Head and Neck Head-normocephalic, atraumatic with no lesions or palpable masses. Face Global Assessment - atraumatic, no absence of expression. Neck Global Assessment - no abnormal movements, no bruit auscultated on the right, no bruit  auscultated on the left, no decreased range of motion, non-tender. Trachea-midline. Thyroid Gland Characteristics - non-tender.  Eye Eyeball - Left-Extraocular movements intact, No Nystagmus - Left. Eyeball - Right-Extraocular movements intact, No Nystagmus - Right. Cornea - Left-No Hazy - Left. Cornea - Right-No Hazy - Right. Sclera/Conjunctiva - Left-No scleral icterus, No Discharge - Left. Sclera/Conjunctiva - Right-No scleral icterus, No Discharge - Right. Pupil - Left-Direct reaction to light normal. Pupil - Right-Direct reaction to light normal.  ENMT Ears Pinna - Left - no drainage observed, no generalized tenderness observed. Pinna - Right - no drainage observed, no generalized tenderness observed. Nose and Sinuses External Inspection of the Nose - no destructive lesion observed. Inspection of the nares - Left - quiet respiration. Inspection of the nares - Right - quiet respiration. Mouth and Throat Lips - Upper Lip - no fissures observed, no pallor noted. Lower Lip - no fissures observed, no pallor noted. Nasopharynx - no discharge present. Oral Cavity/Oropharynx - Tongue - no dryness observed. Oral Mucosa - no cyanosis observed. Hypopharynx - no evidence of airway distress observed. Note: Hard of hearing  Chest and Lung Exam Inspection Movements - Normal and Symmetrical. Accessory muscles - No use of accessory muscles in breathing. Palpation Palpation of the chest reveals - Non-tender. Auscultation Breath sounds - Normal and Clear. Note: Median sternotomy incision. No epigastric hernia.  Cardiovascular Auscultation Rhythm - Regular. Murmurs & Other Heart Sounds - Auscultation of the heart reveals - No Murmurs and No Systolic Clicks.  Abdomen Inspection Inspection of the abdomen reveals - No Visible peristalsis and No Abnormal pulsations. Umbilicus - No Bleeding, No Urine drainage. Palpation/Percussion Palpation and Percussion of the abdomen  reveal - Soft, Non Tender, No Rebound tenderness, No Rigidity (guarding) and No Cutaneous hyperesthesia. Note: Abdomen soft. Nontender. Not distended. No umbilical or incisional hernias. No guarding.  Male Genitourinary Sexual Maturity Tanner 5 - Adult hair pattern and Adult penile size and shape. Note: Obvious left groin bulging with sensitive but reducible hernia. Bulge on Valsalva right side consistent with right inguinal hernia as well.  Uncircumcised normal external male genitalia.  Peripheral Vascular Upper Extremity  Inspection - Left - No Cyanotic nailbeds - Left, Not Ischemic. Inspection - Right - No Cyanotic nailbeds - Right, Not Ischemic.  Neurologic Neurologic evaluation reveals -normal attention span and ability to concentrate, able to name objects and repeat phrases. Appropriate fund of knowledge , normal sensation and normal coordination. Mental Status Affect - not angry, not paranoid. Cranial Nerves-Normal Bilaterally. Gait-Normal.  Neuropsychiatric Mental status exam performed with findings of-able to articulate well with normal speech/language, rate, volume and coherence, thought content normal with ability to perform basic computations and apply abstract reasoning and no evidence of hallucinations, delusions, obsessions or homicidal/suicidal ideation.  Musculoskeletal Global Assessment Spine, Ribs and Pelvis - no instability, subluxation or laxity. Right Upper Extremity - no instability, subluxation or laxity.  Lymphatic Head & Neck  General Head & Neck Lymphatics: Bilateral - Description - No Localized lymphadenopathy. Axillary  General Axillary Region: Bilateral - Description - No Localized lymphadenopathy. Femoral & Inguinal  Generalized Femoral & Inguinal Lymphatics: Left - Description - No Localized lymphadenopathy. Right - Description - No Localized lymphadenopathy.    Assessment & Plan Shawn [name] Shawn [name]; 11/24/2019 5:24 PM)  BILATERAL  INGUINAL HERNIA WITHOUT OBSTRUCTION OR GANGRENE, RECURRENCE NOT SPECIFIED (K40.20) Impression: Obvious left inguinal hernia. Sensitive but reducible. Probable right inguinal hernia on Valsalva as well.  Think he would benefit from surgery. Laparoscopic expiration repair of hernias found.  Does not have a concerning prostatism score. Hold off on any Flomax for now. Hopefully not an increased risk of urinary retention.  He is interested in proceeding.  Make sure he can hold his Eliquis perioperative. He just saw his cardiologist, Dr. Daneen [name], just a couple months ago. Hopefully a quick approval for cardiac clearance versus further workup preoperatively. Patient has good exercise tolerance and is definitely healthier than the average patient for his age. Hopefully remains his risks are minimal.   PREOP - ING HERNIA - ENCOUNTER FOR PREOPERATIVE EXAMINATION FOR GENERAL SURGICAL PROCEDURE (Z01.818)  Current Plans You are being scheduled for surgery- Our schedulers will call you.  You should hear from our office's scheduling department within 5 working days about the location, date, and time of surgery. We try to make accommodations for patient's preferences in scheduling surgery, but sometimes the OR schedule or the surgeon's schedule prevents [place] from making those accommodations.  If you have not heard from our office [phone] in 5 working days, call the office and ask for your surgeon's nurse.  If you have other questions about your diagnosis, plan, or surgery, call the office and ask for your surgeon's nurse.  Written instructions provided The anatomy & physiology of the abdominal wall and pelvic floor was discussed. The pathophysiology of hernias in the inguinal and pelvic region was discussed. Natural history risks such as progressive enlargement, pain, incarceration, and strangulation was discussed. Contributors to complications such as smoking, obesity, diabetes, prior  surgery, etc were discussed.  I feel the risks of no intervention will lead to serious problems that outweigh the operative risks; therefore, I recommended surgery to reduce and repair the hernia. I explained laparoscopic techniques with possible need for an open approach. I noted usual use of mesh to patch and/or buttress hernia repair  Risks such as bleeding, infection, abscess, need for further treatment, heart attack, death, and other risks were discussed. I noted a good likelihood this will help address the problem. Goals of post-operative recovery were discussed as well. Possibility that this will not correct all symptoms was explained. I stressed the importance  of low-impact activity, aggressive pain control, avoiding constipation, & not pushing through pain to minimize risk of post-operative chronic pain or injury. Possibility of reherniation was discussed. We will work to minimize complications.  An educational handout further explaining the pathology & treatment options was given as well. Questions were answered. The patient expresses understanding & wishes to proceed with surgery.  Pt Education - Pamphlet Given - Laparoscopic Hernia Repair: discussed with patient and provided information. Pt Education - [facility] Pain Control (Carina Chaplin) Pt Education - [facility] Hernia Post-Op HCI (Maryfrances Portugal): discussed with patient and provided information. Pt Education - [facility] Mesh education: discussed with patient and provided information.  ANTICOAGULATED (Z79.01)  Current Plans I recommended obtaining preoperative cardiac clearance. I am concerned about the health of the patient and the ability to tolerate the operation. Therefore, we will request clearance by cardiology to better assess operative risk & see if a reevaluation, further workup, etc is needed. Also recommendations on how medications such as for anticoagulation and blood pressure should be managed/held/restarted after surgery. Pt Education -  [facility] Hold anticoagulation preoperatively  Shawn [name], Shawn [name], FACS, MASCRS Gastrointestinal and Minimally Invasive Surgery  [facility] [address] [place], [place] [address] [phone] Fax [phone] Main/Paging  CONTACT INFORMATION: Weekday (9AM-5PM) concerns: Call [facility] main office at [phone] Weeknight (5PM-9AM) or Weekend/Holiday concerns: Check [email or web address] for General Surgery [facility] coverage (Please, do not use SecureChat as it is not reliable communication to operating surgeons for immediate patient care)

## 2019-11-29 ENCOUNTER — Telehealth: Payer: Self-pay | Admitting: *Deleted

## 2019-11-29 DIAGNOSIS — I480 Paroxysmal atrial fibrillation: Secondary | ICD-10-CM

## 2019-11-29 NOTE — Telephone Encounter (Addendum)
Patient with diagnosis of afib on Eliquis for anticoagulation.    Procedure: LAP. B/L INGUINAL HERNIA REPAIR Date of procedure: TBD  CHADS2-VASc score of  5 (CHF, HTN, AGE, CAD, AGE)  Patient has not had a BMP or CBC within the last year. Will need these labs before clearance can be given.

## 2019-11-29 NOTE — Telephone Encounter (Signed)
   [facility] Pre-operative Risk Assessment    HEARTCARE STAFF: - Please ensure there is not already an duplicate clearance open for this procedure. - Under Visit Info/Reason for Call, type in Other and utilize the format Clearance MM/DD/YY or Clearance TBD. Do not use dashes or single digits. - If request is for dental extraction, please clarify the # of teeth to be extracted.  Request for surgical clearance:  1. What type of surgery is being performed? LAP. B/L INGUINAL HERNIA REPAIR   2. When is this surgery scheduled? TBD   3. What type of clearance is required (medical clearance vs. Pharmacy clearance to hold med vs. Both)? BOTH  4. Are there any medications that need to be held prior to surgery and how long? ELIQUIS   5. Practice name and name of physician performing surgery? [facility]; DR. Remo Lipps GROSS   6. What is the office phone number? [phone]   7.   What is the office fax number? [place], CMA  8.   Anesthesia type (None, local, MAC, general) ? GENERAL   Julaine Hua 11/29/2019, 11:39 AM  _________________________________________________________________   (provider comments below)

## 2019-11-29 NOTE — Telephone Encounter (Signed)
Left message for the pt to call the office and schedule lab appt for pre op, BMET, CBC to be done this week. Orders placed.

## 2019-12-01 ENCOUNTER — Other Ambulatory Visit: Payer: Self-pay

## 2019-12-01 ENCOUNTER — Other Ambulatory Visit: Payer: Medicare Other | Admitting: *Deleted

## 2019-12-01 DIAGNOSIS — I480 Paroxysmal atrial fibrillation: Secondary | ICD-10-CM

## 2019-12-02 LAB — CBC
Hematocrit: 43 % (ref 37.5–51.0)
Hemoglobin: 14.3 g/dL (ref 13.0–17.7)
MCH: 31.4 pg (ref 26.6–33.0)
MCHC: 33.3 g/dL (ref 31.5–35.7)
MCV: 94 fL (ref 79–97)
Platelets: 176 10*3/uL (ref 150–450)
RBC: 4.56 x10E6/uL (ref 4.14–5.80)
RDW: 12.4 % (ref 11.6–15.4)
WBC: 8.1 10*3/uL (ref 3.4–10.8)

## 2019-12-02 LAB — BASIC METABOLIC PANEL
BUN/Creatinine Ratio: 18 (ref 10–24)
BUN: 17 mg/dL (ref 8–27)
CO2: 27 mmol/L (ref 20–29)
Calcium: 9.2 mg/dL (ref 8.6–10.2)
Chloride: 101 mmol/L (ref 96–106)
Creatinine, Ser: 0.93 mg/dL (ref 0.76–1.27)
GFR calc Af Amer: 89 mL/min/{1.73_m2} (ref >59)
GFR calc non Af Amer: 77 mL/min/{1.73_m2} (ref >59)
Glucose: 187 mg/dL — ABNORMAL HIGH (ref 65–99)
Potassium: 3.9 mmol/L (ref 3.5–5.2)
Sodium: 141 mmol/L (ref 134–144)

## 2019-12-02 NOTE — Telephone Encounter (Signed)
crcl is 75 ml/min CBC WNL Patient may hold Eliquis for 2 days prior to procedure.

## 2019-12-02 NOTE — Telephone Encounter (Signed)
Pt's lab results are in that were requested by PharmD for pre op clearance. I will now forward this note to the pre op pool.

## 2019-12-05 NOTE — Telephone Encounter (Signed)
Left VM

## 2019-12-05 NOTE — Telephone Encounter (Signed)
   Primary Cardiologist: Sinclair Grooms, MD  Chart reviewed as part of pre-operative protocol coverage. Patient was contacted 12/05/2019 in reference to pre-operative risk assessment for pending surgery as outlined below.  Shawn [name] was last seen on 09/14/19 by Dr. Tamala Julian.  Since that day, Shawn [name] has done well. He can complete more than 4.0 METS without angina.  Per our clinical pharmacist: Patient with diagnosis of afib on Eliquis for anticoagulation.    Procedure: LAP. B/L INGUINAL HERNIA REPAIR Date of procedure: TBD  CHADS2-VASc score of  5 (CHF, HTN, AGE, CAD, AGE)  crcl is 75 ml/min CBC WNL Patient may hold Eliquis for 2 days prior to procedure.   Therefore, based on ACC/AHA guidelines, the patient would be at acceptable risk for the planned procedure without further cardiovascular testing.   The patient was advised that if he develops new symptoms prior to surgery to contact our office to arrange for a follow-up visit, and he verbalized understanding.  I will route this recommendation to the requesting party via Epic fax function and remove from pre-op pool. Please call with questions.  Tami Lin Adama Ferber, PA 12/05/2019, 9:59 AM

## 2019-12-06 ENCOUNTER — Other Ambulatory Visit: Payer: Self-pay | Admitting: Interventional Cardiology

## 2019-12-06 NOTE — Telephone Encounter (Signed)
Prescription refill request for Eliquis received.  Last office visit: Shawn [name] 09/13/2019 Scr: 0.93, 12/01/2019 Age: 84 y.o. Weight: 85.4 kg   Prescription refill sent.

## 2020-01-05 DIAGNOSIS — E1159 Type 2 diabetes mellitus with other circulatory complications: Secondary | ICD-10-CM | POA: Diagnosis not present

## 2020-01-05 DIAGNOSIS — I10 Essential (primary) hypertension: Secondary | ICD-10-CM | POA: Diagnosis not present

## 2020-01-05 DIAGNOSIS — I25769 Atherosclerosis of bypass graft of coronary artery of transplanted heart with unspecified angina pectoris: Secondary | ICD-10-CM | POA: Diagnosis not present

## 2020-01-05 DIAGNOSIS — Z1389 Encounter for screening for other disorder: Secondary | ICD-10-CM | POA: Diagnosis not present

## 2020-01-05 DIAGNOSIS — E7800 Pure hypercholesterolemia, unspecified: Secondary | ICD-10-CM | POA: Diagnosis not present

## 2020-01-05 DIAGNOSIS — I5022 Chronic systolic (congestive) heart failure: Secondary | ICD-10-CM | POA: Diagnosis not present

## 2020-01-05 DIAGNOSIS — M170 Bilateral primary osteoarthritis of knee: Secondary | ICD-10-CM | POA: Diagnosis not present

## 2020-01-05 DIAGNOSIS — I480 Paroxysmal atrial fibrillation: Secondary | ICD-10-CM | POA: Diagnosis not present

## 2020-01-05 DIAGNOSIS — Z23 Encounter for immunization: Secondary | ICD-10-CM | POA: Diagnosis not present

## 2020-01-05 DIAGNOSIS — D6869 Other thrombophilia: Secondary | ICD-10-CM | POA: Diagnosis not present

## 2020-01-05 DIAGNOSIS — Z0000 Encounter for general adult medical examination without abnormal findings: Secondary | ICD-10-CM | POA: Diagnosis not present

## 2020-01-05 DIAGNOSIS — M109 Gout, unspecified: Secondary | ICD-10-CM | POA: Diagnosis not present

## 2020-01-16 NOTE — Patient Instructions (Addendum)
DUE TO COVID-19 ONLY ONE VISITOR IS ALLOWED TO COME WITH YOU AND STAY IN THE WAITING ROOM ONLY DURING PRE OP AND PROCEDURE.   IF YOU WILL BE ADMITTED INTO THE HOSPITAL YOU ARE ALLOWED ONE SUPPORT PERSON DURING VISITATION HOURS ONLY (10AM -8PM)   . The support person may change daily. . The support person must pass our screening, gel in and out, and wear a mask at all times, including in the patient's room. . Patients must also wear a mask when staff or their support person are in the room.   COVID SWAB TESTING MUST BE COMPLETED ON:  Monday, 01-23-20 @ 9:30 AM   [address] [place], [place] [address]  (Must self quarantine after testing. Follow instructions on handout.)       Your procedure is scheduled on:  Thursday, 01-26-20   Report to [facility] Main  Entrance   Report to admitting at 8:00 AM   Call this number if you have problems the morning of surgery [phone]   Do not eat food :After Midnight.   May have liquids until 7:00 AM day of surgery  CLEAR LIQUID DIET  Foods Allowed                                                                     Foods Excluded  Water, Black Coffee and tea, regular and decaf             liquids that you cannot  Plain Jell-O in any flavor  (No red)                                    see through such as: Fruit ices (not with fruit pulp)                                      milk, soups, orange juice              Iced Popsicles (No red)                                      All solid food                                   Apple juices Sports drinks like Gatorade (No red) Lightly seasoned clear broth or consume(fat free) Sugar, honey syrup     Complete one Ensure drink the morning of surgery at  7:00 AM the day of surgery.   Oral Hygiene is also important to reduce your risk of infection.                                    Remember - BRUSH YOUR TEETH THE MORNING OF SURGERY WITH YOUR REGULAR TOOTHPASTE   Do NOT smoke after  Midnight   Take these medicines the morning of surgery with  A SIP OF WATER:  Atenolol, Atorvastatin, Imdur                               You may not have any metal on your body including jewelry, and body piercings             Do not wear lotions, powders, perfumes/cologne, or deodorant             Men may shave face and neck.   Do not bring valuables to the hospital. [place].   Contacts, dentures or bridgework may not be worn into surgery.    Patients discharged the day of surgery will not be allowed to drive home.   Special Instructions: Bring a copy of your healthcare power of attorney and living will documents the day of surgery if you haven't  scanned them in before.              Please read over the following fact sheets you were given: IF YOU HAVE QUESTIONS ABOUT YOUR PRE OP INSTRUCTIONS PLEASE CALL [phone]   [place] - Preparing for Surgery Before surgery, you can play an important role.  Because skin is not sterile, your skin needs to be as free of germs as possible.  You can reduce the number of germs on your skin by washing with CHG (chlorahexidine gluconate) soap before surgery.  CHG is an antiseptic cleaner which kills germs and bonds with the skin to continue killing germs even after washing. Please DO NOT use if you have an allergy to CHG or antibacterial soaps.  If your skin becomes reddened/irritated stop using the CHG and inform your nurse when you arrive at Short Stay. Do not shave (including legs and underarms) for at least 48 hours prior to the first CHG shower.  You may shave your face/neck.  Please follow these instructions carefully:  1.  Shower with CHG Soap the night before surgery and the  morning of surgery.  2.  If you choose to wash your hair, wash your hair first as usual with your normal  shampoo.  3.  After you shampoo, rinse your hair and body thoroughly to remove the shampoo.                             4.   Use CHG as you would any other liquid soap.  You can apply chg directly to the skin and wash.  Gently with a scrungie or clean washcloth.  5.  Apply the CHG Soap to your body ONLY FROM THE NECK DOWN.   Do   not use on face/ open                           Wound or open sores. Avoid contact with eyes, ears mouth and   genitals (private parts).                       Wash face,  Genitals (private parts) with your normal soap.             6.  Wash thoroughly, paying special attention to the area where your    surgery  will be performed.  7.  Thoroughly rinse your body with warm water from the neck down.  8.  DO  NOT shower/wash with your normal soap after using and rinsing off the CHG Soap.                9.  Pat yourself dry with a clean towel.            10.  Wear clean pajamas.            11.  Place clean sheets on your bed the night of your first shower and do not  sleep with pets. Day of Surgery : Do not apply any lotions/deodorants the morning of surgery.  Please wear clean clothes to the hospital/surgery center.  FAILURE TO FOLLOW THESE INSTRUCTIONS MAY RESULT IN THE CANCELLATION OF YOUR SURGERY  PATIENT SIGNATURE_________________________________  NURSE SIGNATURE__________________________________  ________________________________________________________________________

## 2020-01-16 NOTE — Progress Notes (Addendum)
COVID Vaccine Completed:   x3 Date COVID Vaccine completed:03-09-19 & 03-30-19 and Booster 11-18-19 COVID vaccine manufacturer: [employer]   PCP - [name], MD Cardiologist - Daneen Schick, MD  Cardiac clearance in Epic dated 12-05-19 by Fabian Sharp, PA  Chest x-ray -  EKG - 09-06-19 in Epic Stress Test -  ECHO -  Cardiac Cath - 11-30-13 in Epic Pacemaker/ICD device last checked:  Sleep Study - N/A CPAP -   Fasting Blood Sugar -  Checks Blood Sugar _____ times a day  Blood Thinner Instructions:  Eliquis 5 mg.  Okay to hold two days prior per clearance.  Pt aware of when to stop Aspirin Instructions: Last Dose:  Anesthesia review:  CAD, bradycardia, Afib, cardiomyopathy, CHF, HTN.  Hx of MI.  CABG  Patient denies shortness of breath, fever, cough and chest pain at PAT appointment.  Patient able to climb a flight of stairs slowly due to knee pain.  Able to perform ADL's without assistance.   Patient verbalized understanding of instructions that were given to them at the PAT appointment. Patient was also instructed that they will need to review over the PAT instructions again at home before surgery.

## 2020-01-18 ENCOUNTER — Encounter (HOSPITAL_COMMUNITY)
Admission: RE | Admit: 2020-01-18 | Discharge: 2020-01-18 | Disposition: A | Discharge status: Home / Self Care | Payer: Medicare Other | Source: Ambulatory Visit | Attending: Surgery | Admitting: Surgery

## 2020-01-18 ENCOUNTER — Other Ambulatory Visit: Payer: Self-pay

## 2020-01-18 ENCOUNTER — Encounter (HOSPITAL_COMMUNITY): Payer: Self-pay

## 2020-01-18 DIAGNOSIS — Z01812 Encounter for preprocedural laboratory examination: Secondary | ICD-10-CM | POA: Diagnosis not present

## 2020-01-18 DIAGNOSIS — Z79899 Other long term (current) drug therapy: Secondary | ICD-10-CM | POA: Diagnosis not present

## 2020-01-18 DIAGNOSIS — I2510 Atherosclerotic heart disease of native coronary artery without angina pectoris: Secondary | ICD-10-CM | POA: Insufficient documentation

## 2020-01-18 DIAGNOSIS — I252 Old myocardial infarction: Secondary | ICD-10-CM | POA: Diagnosis not present

## 2020-01-18 DIAGNOSIS — I5022 Chronic systolic (congestive) heart failure: Secondary | ICD-10-CM | POA: Insufficient documentation

## 2020-01-18 DIAGNOSIS — K4020 Bilateral inguinal hernia, without obstruction or gangrene, not specified as recurrent: Secondary | ICD-10-CM | POA: Diagnosis not present

## 2020-01-18 DIAGNOSIS — Z7901 Long term (current) use of anticoagulants: Secondary | ICD-10-CM | POA: Insufficient documentation

## 2020-01-18 DIAGNOSIS — Z96643 Presence of artificial hip joint, bilateral: Secondary | ICD-10-CM | POA: Insufficient documentation

## 2020-01-18 DIAGNOSIS — Z955 Presence of coronary angioplasty implant and graft: Secondary | ICD-10-CM | POA: Insufficient documentation

## 2020-01-18 DIAGNOSIS — Z951 Presence of aortocoronary bypass graft: Secondary | ICD-10-CM | POA: Diagnosis not present

## 2020-01-18 DIAGNOSIS — I480 Paroxysmal atrial fibrillation: Secondary | ICD-10-CM | POA: Diagnosis not present

## 2020-01-18 DIAGNOSIS — I110 Hypertensive heart disease with heart failure: Secondary | ICD-10-CM | POA: Diagnosis not present

## 2020-01-18 LAB — BASIC METABOLIC PANEL
Anion gap: 6 (ref 5–15)
BUN: 16 mg/dL (ref 8–23)
CO2: 31 mmol/L (ref 22–32)
Calcium: 9 mg/dL (ref 8.9–10.3)
Chloride: 103 mmol/L (ref 98–111)
Creatinine, Ser: 0.93 mg/dL (ref 0.61–1.24)
GFR, Estimated: >60 mL/min (ref >60)
Glucose, Bld: 145 mg/dL — ABNORMAL HIGH (ref 70–99)
Potassium: 4.4 mmol/L (ref 3.5–5.1)
Sodium: 140 mmol/L (ref 135–145)

## 2020-01-18 LAB — CBC
HCT: 45.2 % (ref 39.0–52.0)
Hemoglobin: 15 g/dL (ref 13.0–17.0)
MCH: 33.1 pg (ref 26.0–34.0)
MCHC: 33.2 g/dL (ref 30.0–36.0)
MCV: 99.8 fL (ref 80.0–100.0)
Platelets: 188 10*3/uL (ref 150–400)
RBC: 4.53 MIL/uL (ref 4.22–5.81)
RDW: 14.5 % (ref 11.5–15.5)
WBC: 8.6 10*3/uL (ref 4.0–10.5)
nRBC: 0 % (ref 0.0–0.2)

## 2020-01-19 DIAGNOSIS — I10 Essential (primary) hypertension: Secondary | ICD-10-CM | POA: Diagnosis not present

## 2020-01-19 DIAGNOSIS — E1159 Type 2 diabetes mellitus with other circulatory complications: Secondary | ICD-10-CM | POA: Diagnosis not present

## 2020-01-19 DIAGNOSIS — M109 Gout, unspecified: Secondary | ICD-10-CM | POA: Diagnosis not present

## 2020-01-19 NOTE — Progress Notes (Signed)
Anesthesia Chart Review   Case: [id number] Date/Time: 01/26/20 0945   Procedure: LAPAROSCOPIC BILATERAL INGUINAL HERNIA REPAIR (N/A )   Anesthesia type: General   Pre-op diagnosis: BILATERAL INGUINAL HERNIA   Location: [facility] ROOM 05 / [facility]   Surgeons: [name], MD      DISCUSSION:84 y.o. light tobacco smoker with h/o HTN, PAF (on Eliquis), CAD, CHF, bilateral inguinal hernia scheduled for above procedure 01/26/220 with Dr. [name].   Per cardiology preoperative risk assessment 12/05/19, "Chart reviewed as part of pre-operative protocol coverage. Patient was contacted 12/05/2019 in reference to pre-operative risk assessment for pending surgery as outlined below.  Shawn [name] was last seen on 09/14/19 by Dr. Tamala Julian.  Since that day, Shawn [name] has done well. He can complete more than 4.0 METS without angina. Per our clinical pharmacist: Patient with diagnosis ofafibon Eliquisfor anticoagulation.  YKDXIPJAS:NKN. B/L INGUINAL HERNIA REPAIR Date of procedure:TBD CHADS2-VASc score of5(CHF,HTN,AGE, CAD, AGE) crcl is 75 ml/min CBC WNL Patient may hold Eliquis for 2 days prior to procedure.  Therefore, based on ACC/AHA guidelines, the patient would be at acceptable risk for the planned procedure without further cardiovascular testing."  Anticipate pt can proceed with planned procedure barring acute status change.    VS: BP (!) 166/83   Pulse (!) 59   Temp 36.6 C (Oral)   Resp 16   Ht 6\' 5"  (1.956 m)   Wt 86.6 kg   SpO2 99%   BMI 22.65 kg/m   PROVIDERS: [name], MD is PCP   Daneen Schick, MD is Cardiologist  LABS: Labs reviewed: Acceptable for surgery. (all labs ordered are listed, but only abnormal results are displayed)  Labs Reviewed  BASIC METABOLIC PANEL - Abnormal; Notable for the following components:      Result Value   Glucose, Bld 145 (*)    All other components within normal limits  CBC     IMAGES:   EKG: 09/14/19 Rate 63 bpm   A flutter, rate controlled  CV:  Past Medical History:  Diagnosis Date  . Anterior myocardial infarction ([place]) 09/2009   [name] 04/22/2010  . Arthritis    "knees" (11/29/2013)  . Bladder cancer ([place]) 2007  . Chronic systolic CHF (congestive heart failure) ([place])   . Coronary artery disease    a. s/p CABG in 1992. b. stenting to Cx 2004/2005. c. BMS-SVG-LAD 2008. d. MI 2011 s/p stents to SVG-LAD. e. redo CABGx2 in 2012. f. [place] 2015: med rx.  . Essential hypertension   . Hyperglycemia   . Hyperlipidemia   . Hypotension    a. ACEI stopped 09/2015 due to this.  . Ischemic cardiomyopathy    a. EF 40-45% by [place] 2015.  Marland Kitchen PAF (paroxysmal atrial fibrillation) ([place])   . Sinus bradycardia    a. HR 50s on prior EKG.    Past Surgical History:  Procedure Laterality Date  . APPENDECTOMY  1940's  . CARDIAC CATHETERIZATION  09/2006   [name] 06/20/2010  . CATARACT EXTRACTION W/ INTRAOCULAR LENS  IMPLANT, BILATERAL Bilateral 1990's  . CORONARY ANGIOPLASTY WITH STENT PLACEMENT  "several since 1992"  . CORONARY ARTERY BYPASS GRAFT  1992; ~2012   "CABG X3; CABH X 3"  . JOINT REPLACEMENT    . LEFT HEART CATHETERIZATION WITH CORONARY/GRAFT ANGIOGRAM N/A 11/30/2013   Procedure: LEFT HEART CATHETERIZATION WITH Beatrix Fetters;  Surgeon: Troy Sine, MD;  Location: [facility] C[facility] LAB;  Service: Cardiovascular;  Laterality: N/A;  . TONSILLECTOMY  1940's  .  TOTAL HIP ARTHROPLASTY Left 1992  . TOTAL HIP ARTHROPLASTY Right ~ 1994    MEDICATIONS: . atenolol (TENORMIN) 25 MG tablet  . atorvastatin (LIPITOR) 80 MG tablet  . cholecalciferol (VITAMIN D3) 25 MCG (1000 UNIT) tablet  . colchicine 0.6 MG tablet  . ELIQUIS 5 MG TABS tablet  . isosorbide mononitrate (IMDUR) 30 MG 24 hr tablet  . nitroGLYCERIN (NITROSTAT) 0.4 MG SL tablet   No current facility-administered medications for this encounter.    Shawn Felix, PA-C [facility] Pre-Surgical Testing [phone]

## 2020-01-19 NOTE — Anesthesia Preprocedure Evaluation (Addendum)
Anesthesia Evaluation  Patient identified by MRN, date of birth, ID band Patient awake    Reviewed: Allergy & Precautions, NPO status , Patient's Chart, lab work & pertinent test results  Airway Mallampati: II  TM Distance: >3 FB Neck ROM: Full    Dental  (+) Dental Advisory Given   Pulmonary Current Smoker,    breath sounds clear to auscultation       Cardiovascular hypertension, Pt. on medications and Pt. on home beta blockers + CAD, + Past MI, + Cardiac Stents, + CABG and +CHF  + dysrhythmias  Rhythm:Regular Rate:Normal     Neuro/Psych negative neurological ROS     GI/Hepatic negative GI ROS, Neg liver ROS,   Endo/Other  negative endocrine ROS  Renal/GU negative Renal ROS     Musculoskeletal  (+) Arthritis ,   Abdominal   Peds  Hematology negative hematology ROS (+)   Anesthesia Other Findings   Reproductive/Obstetrics                            Lab Results  Component Value Date   WBC 8.6 01/18/2020   HGB 15.0 01/18/2020   HCT 45.2 01/18/2020   MCV 99.8 01/18/2020   PLT 188 01/18/2020   Lab Results  Component Value Date   CREATININE 0.93 01/18/2020   BUN 16 01/18/2020   NA 140 01/18/2020   K 4.4 01/18/2020   CL 103 01/18/2020   CO2 31 01/18/2020    Anesthesia Physical Anesthesia Plan  ASA: III  Anesthesia Plan: General   Post-op Pain Management:    Induction: Intravenous  PONV Risk Score and Plan: 1 and Dexamethasone, Ondansetron and Treatment may vary due to age or medical condition  Airway Management Planned: Oral ETT  Additional Equipment: None  Intra-op Plan:   Post-operative Plan: Extubation in OR  Informed Consent: I have reviewed the patients History and Physical, chart, labs and discussed the procedure including the risks, benefits and alternatives for the proposed anesthesia with the patient or authorized representative who has indicated his/her  understanding and acceptance.     Dental advisory given  Plan Discussed with: CRNA  Anesthesia Plan Comments:        Anesthesia Quick Evaluation

## 2020-01-23 ENCOUNTER — Other Ambulatory Visit (HOSPITAL_COMMUNITY)
Admission: RE | Admit: 2020-01-23 | Discharge: 2020-01-23 | Disposition: A | Discharge status: Home / Self Care | Payer: Medicare Other | Source: Ambulatory Visit | Attending: Surgery | Admitting: Surgery

## 2020-01-23 DIAGNOSIS — Z20822 Contact with and (suspected) exposure to covid-19: Secondary | ICD-10-CM | POA: Diagnosis not present

## 2020-01-23 DIAGNOSIS — Z01812 Encounter for preprocedural laboratory examination: Secondary | ICD-10-CM | POA: Diagnosis not present

## 2020-01-23 LAB — SARS CORONAVIRUS 2 (TAT 6-24 HRS): SARS Coronavirus 2: NEGATIVE

## 2020-01-25 ENCOUNTER — Encounter (HOSPITAL_COMMUNITY): Payer: Self-pay | Admitting: Surgery

## 2020-01-25 MED ORDER — CLINDAMYCIN PHOSPHATE 900 MG/50ML IV SOLN
900.0000 mg | INTRAVENOUS | Status: AC
  Administered 2020-01-26: 900 mg via INTRAVENOUS
  Filled 2020-01-25: qty 50

## 2020-01-25 MED ORDER — BUPIVACAINE LIPOSOME 1.3 % IJ SUSP
20.0000 mL | Freq: Once | INTRAMUSCULAR | Status: DC
  Filled 2020-01-25: qty 20

## 2020-01-25 MED ORDER — GENTAMICIN SULFATE 40 MG/ML IJ SOLN
5.0000 mg/kg | INTRAVENOUS | Status: AC
  Administered 2020-01-26: 430 mg via INTRAVENOUS
  Filled 2020-01-25: qty 10.75

## 2020-01-26 ENCOUNTER — Ambulatory Visit (HOSPITAL_COMMUNITY): Payer: Medicare Other | Admitting: Anesthesiology

## 2020-01-26 ENCOUNTER — Encounter (HOSPITAL_COMMUNITY)
Admission: RE | Disposition: A | Discharge status: Home / Self Care | Payer: Self-pay | Source: Home / Self Care | Attending: Surgery

## 2020-01-26 ENCOUNTER — Telehealth: Payer: Self-pay | Admitting: Interventional Cardiology

## 2020-01-26 ENCOUNTER — Other Ambulatory Visit: Payer: Self-pay

## 2020-01-26 ENCOUNTER — Ambulatory Visit (HOSPITAL_COMMUNITY): Payer: Medicare Other | Admitting: Physician Assistant

## 2020-01-26 ENCOUNTER — Ambulatory Visit (HOSPITAL_COMMUNITY)
Admission: RE | Admit: 2020-01-26 | Discharge: 2020-01-26 | Disposition: A | Discharge status: Home / Self Care | Payer: Medicare Other | Attending: Surgery | Admitting: Surgery

## 2020-01-26 ENCOUNTER — Encounter (HOSPITAL_COMMUNITY): Payer: Self-pay | Admitting: Surgery

## 2020-01-26 DIAGNOSIS — Z951 Presence of aortocoronary bypass graft: Secondary | ICD-10-CM | POA: Insufficient documentation

## 2020-01-26 DIAGNOSIS — I110 Hypertensive heart disease with heart failure: Secondary | ICD-10-CM | POA: Diagnosis not present

## 2020-01-26 DIAGNOSIS — I2510 Atherosclerotic heart disease of native coronary artery without angina pectoris: Secondary | ICD-10-CM | POA: Diagnosis not present

## 2020-01-26 DIAGNOSIS — I480 Paroxysmal atrial fibrillation: Secondary | ICD-10-CM | POA: Diagnosis not present

## 2020-01-26 DIAGNOSIS — Z96649 Presence of unspecified artificial hip joint: Secondary | ICD-10-CM | POA: Diagnosis not present

## 2020-01-26 DIAGNOSIS — Z7901 Long term (current) use of anticoagulants: Secondary | ICD-10-CM | POA: Diagnosis not present

## 2020-01-26 DIAGNOSIS — I4891 Unspecified atrial fibrillation: Secondary | ICD-10-CM | POA: Insufficient documentation

## 2020-01-26 DIAGNOSIS — I5022 Chronic systolic (congestive) heart failure: Secondary | ICD-10-CM | POA: Diagnosis not present

## 2020-01-26 DIAGNOSIS — I4892 Unspecified atrial flutter: Secondary | ICD-10-CM | POA: Diagnosis not present

## 2020-01-26 DIAGNOSIS — Z79899 Other long term (current) drug therapy: Secondary | ICD-10-CM | POA: Insufficient documentation

## 2020-01-26 DIAGNOSIS — K4020 Bilateral inguinal hernia, without obstruction or gangrene, not specified as recurrent: Secondary | ICD-10-CM | POA: Diagnosis not present

## 2020-01-26 DIAGNOSIS — Z87891 Personal history of nicotine dependence: Secondary | ICD-10-CM | POA: Insufficient documentation

## 2020-01-26 HISTORY — PX: INGUINAL HERNIA REPAIR: SHX194

## 2020-01-26 SURGERY — REPAIR, HERNIA, INGUINAL, BILATERAL, LAPAROSCOPIC
Anesthesia: General | Site: Abdomen

## 2020-01-26 MED ORDER — PHENYLEPHRINE 40 MCG/ML (10ML) SYRINGE FOR IV PUSH (FOR BLOOD PRESSURE SUPPORT)
PREFILLED_SYRINGE | INTRAVENOUS | Status: AC
  Filled 2020-01-26: qty 10

## 2020-01-26 MED ORDER — BUPIVACAINE LIPOSOME 1.3 % IJ SUSP
INTRAMUSCULAR | Status: DC | PRN
  Administered 2020-01-26: 20 mL

## 2020-01-26 MED ORDER — SUGAMMADEX SODIUM 200 MG/2ML IV SOLN
INTRAVENOUS | Status: DC | PRN
  Administered 2020-01-26: 200 mg via INTRAVENOUS

## 2020-01-26 MED ORDER — FENTANYL CITRATE (PF) 100 MCG/2ML IJ SOLN
INTRAMUSCULAR | Status: AC
  Filled 2020-01-26: qty 2

## 2020-01-26 MED ORDER — ACETAMINOPHEN 500 MG PO TABS
1000.0000 mg | ORAL_TABLET | ORAL | Status: AC
  Administered 2020-01-26: 1000 mg via ORAL
  Filled 2020-01-26: qty 2

## 2020-01-26 MED ORDER — BUPIVACAINE-EPINEPHRINE (PF) 0.25% -1:200000 IJ SOLN
INTRAMUSCULAR | Status: AC
  Filled 2020-01-26: qty 60

## 2020-01-26 MED ORDER — PROPOFOL 10 MG/ML IV BOLUS
INTRAVENOUS | Status: DC | PRN
  Administered 2020-01-26: 110 mg via INTRAVENOUS

## 2020-01-26 MED ORDER — METOPROLOL TARTRATE 5 MG/5ML IV SOLN
INTRAVENOUS | Status: AC
  Filled 2020-01-26: qty 5

## 2020-01-26 MED ORDER — CHLORHEXIDINE GLUCONATE 0.12 % MT SOLN
15.0000 mL | Freq: Once | OROMUCOSAL | Status: AC
  Administered 2020-01-26: 15 mL via OROMUCOSAL

## 2020-01-26 MED ORDER — ENSURE PRE-SURGERY PO LIQD
296.0000 mL | Freq: Once | ORAL | Status: DC
  Filled 2020-01-26: qty 296

## 2020-01-26 MED ORDER — CHLORHEXIDINE GLUCONATE CLOTH 2 % EX PADS: 6.0000 | MEDICATED_PAD | Freq: Once | CUTANEOUS | Status: DC

## 2020-01-26 MED ORDER — ROCURONIUM BROMIDE 10 MG/ML (PF) SYRINGE
PREFILLED_SYRINGE | INTRAVENOUS | Status: DC | PRN
  Administered 2020-01-26: 10 mg via INTRAVENOUS
  Administered 2020-01-26: 40 mg via INTRAVENOUS
  Administered 2020-01-26: 20 mg via INTRAVENOUS

## 2020-01-26 MED ORDER — BUPIVACAINE-EPINEPHRINE 0.25% -1:200000 IJ SOLN
INTRAMUSCULAR | Status: DC | PRN
  Administered 2020-01-26: 60 mL

## 2020-01-26 MED ORDER — LIDOCAINE 2% (20 MG/ML) 5 ML SYRINGE
INTRAMUSCULAR | Status: DC | PRN
  Administered 2020-01-26: 50 mg via INTRAVENOUS

## 2020-01-26 MED ORDER — GABAPENTIN 300 MG PO CAPS
300.0000 mg | ORAL_CAPSULE | ORAL | Status: AC
  Administered 2020-01-26: 300 mg via ORAL
  Filled 2020-01-26: qty 1

## 2020-01-26 MED ORDER — EPHEDRINE SULFATE-NACL 50-0.9 MG/10ML-% IV SOSY
PREFILLED_SYRINGE | INTRAVENOUS | Status: DC | PRN
  Administered 2020-01-26 (×2): 10 mg via INTRAVENOUS

## 2020-01-26 MED ORDER — FENTANYL CITRATE (PF) 100 MCG/2ML IJ SOLN
INTRAMUSCULAR | Status: DC | PRN
  Administered 2020-01-26: 75 ug via INTRAVENOUS
  Administered 2020-01-26: 25 ug via INTRAVENOUS
  Administered 2020-01-26: 50 ug via INTRAVENOUS

## 2020-01-26 MED ORDER — ONDANSETRON HCL 4 MG/2ML IJ SOLN
INTRAMUSCULAR | Status: DC | PRN
  Administered 2020-01-26: 4 mg via INTRAVENOUS

## 2020-01-26 MED ORDER — FENTANYL CITRATE (PF) 100 MCG/2ML IJ SOLN: 25.0000 ug | INTRAMUSCULAR | Status: DC | PRN

## 2020-01-26 MED ORDER — STERILE WATER FOR IRRIGATION IR SOLN
Status: DC | PRN
  Administered 2020-01-26: 1000 mL

## 2020-01-26 MED ORDER — AMISULPRIDE (ANTIEMETIC) 5 MG/2ML IV SOLN: 10.0000 mg | Freq: Once | INTRAVENOUS | Status: DC | PRN

## 2020-01-26 MED ORDER — 0.9 % SODIUM CHLORIDE (POUR BTL) OPTIME
TOPICAL | Status: DC | PRN
  Administered 2020-01-26: 1000 mL

## 2020-01-26 MED ORDER — ORAL CARE MOUTH RINSE: 15.0000 mL | Freq: Once | OROMUCOSAL | Status: AC

## 2020-01-26 MED ORDER — DEXAMETHASONE SODIUM PHOSPHATE 10 MG/ML IJ SOLN
INTRAMUSCULAR | Status: DC | PRN
  Administered 2020-01-26: 8 mg via INTRAVENOUS

## 2020-01-26 MED ORDER — METOPROLOL TARTRATE 5 MG/5ML IV SOLN
2.0000 mg | Freq: Once | INTRAVENOUS | Status: AC
  Administered 2020-01-26: 2 mg via INTRAVENOUS

## 2020-01-26 MED ORDER — ESMOLOL HCL 100 MG/10ML IV SOLN
INTRAVENOUS | Status: DC | PRN
  Administered 2020-01-26 (×9): 10 mg via INTRAVENOUS

## 2020-01-26 MED ORDER — METOPROLOL TARTRATE 5 MG/5ML IV SOLN
INTRAVENOUS | Status: DC | PRN
  Administered 2020-01-26: 1 mg via INTRAVENOUS
  Administered 2020-01-26: 2 mg via INTRAVENOUS

## 2020-01-26 MED ORDER — VANCOMYCIN HCL IN DEXTROSE 1-5 GM/200ML-% IV SOLN
INTRAVENOUS | Status: AC
  Filled 2020-01-26: qty 200

## 2020-01-26 MED ORDER — EPHEDRINE 5 MG/ML INJ
INTRAVENOUS | Status: AC
  Filled 2020-01-26: qty 10

## 2020-01-26 MED ORDER — LACTATED RINGERS IV SOLN: INTRAVENOUS | Status: DC

## 2020-01-26 MED ORDER — ESMOLOL HCL 100 MG/10ML IV SOLN
INTRAVENOUS | Status: AC
  Filled 2020-01-26: qty 10

## 2020-01-26 MED ORDER — PHENYLEPHRINE 40 MCG/ML (10ML) SYRINGE FOR IV PUSH (FOR BLOOD PRESSURE SUPPORT)
PREFILLED_SYRINGE | INTRAVENOUS | Status: DC | PRN
  Administered 2020-01-26 (×4): 80 ug via INTRAVENOUS

## 2020-01-26 SURGICAL SUPPLY — 38 items
APL PRP STRL LF DISP 70% ISPRP (MISCELLANEOUS) ×1
CABLE HIGH FREQUENCY MONO STRZ (ELECTRODE) ×3 IMPLANT
CHLORAPREP W/TINT 26 (MISCELLANEOUS) ×3 IMPLANT
COVER SURGICAL LIGHT HANDLE (MISCELLANEOUS) ×3 IMPLANT
COVER WAND RF STERILE (DRAPES) IMPLANT
DECANTER SPIKE VIAL GLASS SM (MISCELLANEOUS) ×3 IMPLANT
DEVICE SECURE STRAP 25 ABSORB (INSTRUMENTS) IMPLANT
DRAPE WARM FLUID 44X44 (DRAPES) ×3 IMPLANT
DRSG TEGADERM 2-3/8X2-3/4 SM (GAUZE/BANDAGES/DRESSINGS) ×6 IMPLANT
DRSG TEGADERM 4X4.75 (GAUZE/BANDAGES/DRESSINGS) ×3 IMPLANT
ELECT REM PT RETURN 15FT ADLT (MISCELLANEOUS) ×3 IMPLANT
GAUZE SPONGE 2X2 8PLY STRL LF (GAUZE/BANDAGES/DRESSINGS) ×1 IMPLANT
GLOVE ECLIPSE 8.0 STRL XLNG CF (GLOVE) ×3 IMPLANT
GLOVE INDICATOR 8.0 STRL GRN (GLOVE) ×3 IMPLANT
GOWN STRL REUS W/TWL XL LVL3 (GOWN DISPOSABLE) ×6 IMPLANT
IRRIG SUCT STRYKERFLOW 2 WTIP (MISCELLANEOUS)
IRRIGATION SUCT STRKRFLW 2 WTP (MISCELLANEOUS) IMPLANT
KIT BASIN OR (CUSTOM PROCEDURE TRAY) ×3 IMPLANT
KIT TURNOVER KIT A (KITS) ×2 IMPLANT
MESH ULTRAPRO 6X6 15CM15CM (Mesh General) ×6 IMPLANT
NDL INSUFFLATION 14GA 120MM (NEEDLE) IMPLANT
NEEDLE INSUFFLATION 14GA 120MM (NEEDLE) IMPLANT
PAD POSITIONING PINK XL (MISCELLANEOUS) ×3 IMPLANT
SCISSORS LAP 5X35 DISP (ENDOMECHANICALS) ×3 IMPLANT
SET TUBE SMOKE EVAC HIGH FLOW (TUBING) ×3 IMPLANT
SLEEVE ADV FIXATION 5X100MM (TROCAR) ×3 IMPLANT
SPONGE GAUZE 2X2 STER 10/PKG (GAUZE/BANDAGES/DRESSINGS) ×2
SUT MNCRL AB 4-0 PS2 18 (SUTURE) ×3 IMPLANT
SUT PDS AB 1 CT1 27 (SUTURE) ×6 IMPLANT
SUT VIC AB 2-0 SH 27 (SUTURE)
SUT VIC AB 2-0 SH 27X BRD (SUTURE) IMPLANT
SUT VICRYL 0 UR6 27IN ABS (SUTURE) ×3 IMPLANT
TACKER 5MM HERNIA 3.5CML NAB (ENDOMECHANICALS) IMPLANT
TOWEL OR 17X26 10 PK STRL BLUE (TOWEL DISPOSABLE) ×3 IMPLANT
TOWEL OR NON WOVEN STRL DISP B (DISPOSABLE) ×3 IMPLANT
TRAY LAPAROSCOPIC (CUSTOM PROCEDURE TRAY) ×3 IMPLANT
TROCAR ADV FIXATION 5X100MM (TROCAR) ×3 IMPLANT
TROCAR XCEL BLUNT TIP 100MML (ENDOMECHANICALS) ×3 IMPLANT

## 2020-01-26 NOTE — Telephone Encounter (Signed)
Spoke with son and advised ok from a cardiac standpoint to use Tylenol but to double check with surgeon's office.  He states his brother is on the phone with the surgeon's office now and they said the same thing.  Son appreciative for call.

## 2020-01-26 NOTE — Transfer of Care (Signed)
Immediate Anesthesia Transfer of Care Note  Patient: Shawn [name]  Procedure(s) Performed: Procedure(s): LAPAROSCOPIC BILATERAL INGUINAL HERNIA REPAIR, BILATERAL TAP BLOCK (N/A)  Patient Location: PACU  Anesthesia Type:General  Level of Consciousness:  sedated, patient cooperative and responds to stimulation  Airway & Oxygen Therapy:Patient Spontanous Breathing and Patient connected to face mask oxgen  Post-op Assessment:  Report given to PACU RN and Post -op Vital signs reviewed and stable  Post vital signs:  Reviewed and stable  Last Vitals:  Vitals:   01/26/20 0814 01/26/20 1156  BP: 139/66 (!) 149/74  Pulse: 63 (!) 109  Resp: 18 20  Temp: 36.4 C (!) 36.3 C  SpO2: 38% 871%    Complications: No apparent anesthesia complications

## 2020-01-26 NOTE — Anesthesia Procedure Notes (Signed)
Procedure Name: Intubation Date/Time: 01/26/2020 10:15 AM Performed by: Lavina Hamman, CRNA Pre-anesthesia Checklist: Patient identified, Emergency Drugs available, Suction available, Patient being monitored and Timeout performed Patient Re-evaluated:Patient Re-evaluated prior to induction Oxygen Delivery Method: Circle system utilized Preoxygenation: Pre-oxygenation with 100% oxygen Induction Type: IV induction Ventilation: Mask ventilation without difficulty Laryngoscope Size: Mac and 4 Grade View: Grade II Tube type: Oral Tube size: 8.0 mm Number of attempts: 1 Airway Equipment and Method: Stylet Placement Confirmation: ETT inserted through vocal cords under direct vision,  positive ETCO2,  CO2 detector and breath sounds checked- equal and bilateral Secured at: 23 cm Tube secured with: Tape Dental Injury: Teeth and Oropharynx as per pre-operative assessment  Comments: ATOI

## 2020-01-26 NOTE — Telephone Encounter (Signed)
Patient just had surgery for a hernia. Son wanted to know if the patient could take Tylenol for pain. Please advise

## 2020-01-26 NOTE — H&P (Signed)
Shawn [name] DOB: [birth year] Widowed / Language: Shawn [name] / Race: White Male  Patient Care Team: [name], MD as PCP - General (Family Medicine) [name], MD as PCP - Cardiology (Cardiology) [name], MD as Consulting Physician (General Surgery) [name], MD as Consulting Physician (Orthopedic Surgery) [name], MD as Consulting Physician (Cardiology)  ` ` Patient sent for surgical consultation at the request of [name] at [facility]  Chief Complaint: Left groin swelling. Probable hernia. ` ` The patient is an elderly gentleman. History of hip replacements. Noticed some bulging in his groin. Discuss with orthopedic surgery. Recommended primary care follow-up. I noted at the [place] office. Inguinal hernia suspected. Surgical consultation offered. Patient has a history of atrial flutter and fibrillation anticoagulated on Eliquis. History coronary disease with bypass graft in 1992. Redo bypass grafting in 2012 by Dr. Cyndia Bent. Followed by Dr. Daneen Schick. Seen in the summer doing relatively well. Hyponatremia followed by primary care office.  Usually is rather active. Golf regularly. Works at [employer]. He's held off on being more active since his diagnosis. Can walk a mile easily without difficulty. Moves his bowels every day. No problems with urination or defecation. No prostate problems. It appendectomy at age 69 but no other abdominal surgeries. No sleep apnea. No diabetic. He does not smoke. No history of skin infections. Hard of hearing.  (Review of systems as stated in this history (HPI) or in the review of systems. Otherwise all other 12 point ROS are negative) ` ` ###########################################`  This patient encounter took 30 minutes today to perform the following: obtain history, perform exam, review outside records, interpret tests & imaging, counsel the patient on their diagnosis; and, document this  encounter, including findings & plan in the electronic health record (EHR).   Past Surgical History (Shawn [name], [place]; 11/24/2019 2:15 PM) Appendectomy Cataract Surgery Bilateral. Hip Surgery Bilateral.  Diagnostic Studies History (Shawn [name], [place]; 11/24/2019 2:15 PM) Colonoscopy >10 years ago  Allergies (Shawn [name], [place]; 11/24/2019 2:15 PM) Penicillins Allergies Reconciled  Medication History (Shawn [name], CMA; 11/24/2019 2:16 PM) Eliquis (5MG  Tablet, Oral) Active. Atenolol (25MG  Tablet, Oral) Active. Atorvastatin Calcium (80MG  Tablet, Oral) Active. Isosorbide Mononitrate ER (30MG  Tablet ER 24HR, Oral) Active. Nitrostat (0.4MG  Tab Sublingual, Sublingual) Active. Medications Reconciled  Social History Shawn [name], CMA; 11/24/2019 2:15 PM) Alcohol use Occasional alcohol use. Caffeine use Coffee. No drug use Tobacco use Former smoker.  Family History ([name], [place]; 11/24/2019 2:15 PM) First Degree Relatives No pertinent family history  Other Problems (Shawn [name], [place]; 11/24/2019 2:15 PM) Myocardial infarction     Review of Systems (Shawn Nolan CMA; 11/24/2019 2:15 PM) General Not Present- Appetite Loss, Chills, Fatigue, Fever, Night Sweats, Weight Gain and Weight Loss. Skin Not Present- Change in Wart/Mole, Dryness, Hives, Jaundice, New Lesions, Non-Healing Wounds, Rash and Ulcer. HEENT Not Present- Earache, Hearing Loss, Hoarseness, Nose Bleed, Oral Ulcers, Ringing in the Ears, Seasonal Allergies, Sinus Pain, Sore Throat, Visual Disturbances, Wears glasses/contact lenses and Yellow Eyes. Respiratory Not Present- Bloody sputum, Chronic Cough, Difficulty Breathing, Snoring and Wheezing. Breast Not Present- Breast Mass, Breast Pain, Nipple Discharge and Skin Changes. Cardiovascular Not Present- Chest Pain, Difficulty Breathing Lying Down, Leg Cramps, Palpitations, Rapid Heart Rate, Shortness of Breath and Swelling of  Extremities. Gastrointestinal Not Present- Abdominal Pain, Bloating, Bloody Stool, Change in Bowel Habits, Chronic diarrhea, Constipation, Difficulty Swallowing, Excessive gas, Gets full quickly at meals, Hemorrhoids, Indigestion, Nausea, Rectal Pain and Vomiting. Male Genitourinary Not Present- Blood  in Urine, Change in Urinary Stream, Frequency, Impotence, Nocturia, Painful Urination, Urgency and Urine Leakage. Musculoskeletal Not Present- Back Pain, Joint Pain, Joint Stiffness, Muscle Pain, Muscle Weakness and Swelling of Extremities. Neurological Not Present- Decreased Memory, Fainting, Headaches, Numbness, Seizures, Tingling, Tremor, Trouble walking and Weakness. Psychiatric Not Present- Anxiety, Bipolar, Change in Sleep Pattern, Depression, Fearful and Frequent crying. Endocrine Not Present- Cold Intolerance, Excessive Hunger, Hair Changes, Heat Intolerance, Hot flashes and New Diabetes. Hematology Not Present- Blood Thinners, Easy Bruising, Excessive bleeding, Gland problems, HIV and Persistent Infections.  Vitals (Shawn Nolan CMA; 11/24/2019 2:16 PM) 11/24/2019 2:16 PM Weight: 191.13 lb Height: 75in Body Surface Area: 2.15 m Body Mass Index: 23.89 kg/m  Temp.: 98.3F  Pulse: 25 (Regular)  BP: 118/74(Sitting, Left Arm, Standard)   BP 139/66   Pulse 63   Temp 97.6 F (36.4 C) (Oral)   Resp 18   Ht 6\' 5"  (1.956 m)   Wt 86.6 kg   SpO2 99%   BMI 22.65 kg/m  01/26/2020      Physical Exam Adin Hector MD; 11/24/2019 2:55 PM)  General Mental Status-Alert. General Appearance-Not in acute distress, Not Sickly. Orientation-Oriented X3. Hydration-Well hydrated. Voice-Normal.  Integumentary Global Assessment Upon inspection and palpation of skin surfaces of the - Axillae: non-tender, no inflammation or ulceration, no drainage. and Distribution of scalp and body hair is normal. General Characteristics Temperature - normal warmth is  noted.  Head and Neck Head-normocephalic, atraumatic with no lesions or palpable masses. Face Global Assessment - atraumatic, no absence of expression. Neck Global Assessment - no abnormal movements, no bruit auscultated on the right, no bruit auscultated on the left, no decreased range of motion, non-tender. Trachea-midline. Thyroid Gland Characteristics - non-tender.  Eye Eyeball - Left-Extraocular movements intact, No Nystagmus - Left. Eyeball - Right-Extraocular movements intact, No Nystagmus - Right. Cornea - Left-No Hazy - Left. Cornea - Right-No Hazy - Right. Sclera/Conjunctiva - Left-No scleral icterus, No Discharge - Left. Sclera/Conjunctiva - Right-No scleral icterus, No Discharge - Right. Pupil - Left-Direct reaction to light normal. Pupil - Right-Direct reaction to light normal.  ENMT Ears Pinna - Left - no drainage observed, no generalized tenderness observed. Pinna - Right - no drainage observed, no generalized tenderness observed. Nose and Sinuses External Inspection of the Nose - no destructive lesion observed. Inspection of the nares - Left - quiet respiration. Inspection of the nares - Right - quiet respiration. Mouth and Throat Lips - Upper Lip - no fissures observed, no pallor noted. Lower Lip - no fissures observed, no pallor noted. Nasopharynx - no discharge present. Oral Cavity/Oropharynx - Tongue - no dryness observed. Oral Mucosa - no cyanosis observed. Hypopharynx - no evidence of airway distress observed. Note: Hard of hearing  Chest and Lung Exam Inspection Movements - Normal and Symmetrical. Accessory muscles - No use of accessory muscles in breathing. Palpation Palpation of the chest reveals - Non-tender. Auscultation Breath sounds - Normal and Clear. Note: Median sternotomy incision. No epigastric hernia.  Cardiovascular Auscultation Rhythm - Regular. Murmurs & Other Heart Sounds - Auscultation of the heart reveals -  No Murmurs and No Systolic Clicks.  Abdomen Inspection Inspection of the abdomen reveals - No Visible peristalsis and No Abnormal pulsations. Umbilicus - No Bleeding, No Urine drainage. Palpation/Percussion Palpation and Percussion of the abdomen reveal - Soft, Non Tender, No Rebound tenderness, No Rigidity (guarding) and No Cutaneous hyperesthesia. Note: Abdomen soft. Nontender. Not distended. No umbilical or incisional hernias. No guarding.  Male Genitourinary Sexual  Maturity Tanner 5 - Adult hair pattern and Adult penile size and shape. Note: Obvious left groin bulging with sensitive but reducible hernia. Bulge on Valsalva right side consistent with right inguinal hernia as well.  Uncircumcised normal external male genitalia.  Peripheral Vascular Upper Extremity Inspection - Left - No Cyanotic nailbeds - Left, Not Ischemic. Inspection - Right - No Cyanotic nailbeds - Right, Not Ischemic.  Neurologic Neurologic evaluation reveals -normal attention span and ability to concentrate, able to name objects and repeat phrases. Appropriate fund of knowledge , normal sensation and normal coordination. Mental Status Affect - not angry, not paranoid. Cranial Nerves-Normal Bilaterally. Gait-Normal.  Neuropsychiatric Mental status exam performed with findings of-able to articulate well with normal speech/language, rate, volume and coherence, thought content normal with ability to perform basic computations and apply abstract reasoning and no evidence of hallucinations, delusions, obsessions or homicidal/suicidal ideation.  Musculoskeletal Global Assessment Spine, Ribs and Pelvis - no instability, subluxation or laxity. Right Upper Extremity - no instability, subluxation or laxity.  Lymphatic Head & Neck  General Head & Neck Lymphatics: Bilateral - Description - No Localized lymphadenopathy. Axillary  General Axillary Region: Bilateral - Description - No Localized  lymphadenopathy. Femoral & Inguinal  Generalized Femoral & Inguinal Lymphatics: Left - Description - No Localized lymphadenopathy. Right - Description - No Localized lymphadenopathy.    Assessment & Plan  BILATERAL INGUINAL HERNIA WITHOUT OBSTRUCTION OR GANGRENE, RECURRENCE NOT SPECIFIED (K40.20) Impression: Obvious left inguinal hernia. Sensitive but reducible. Probable right inguinal hernia on Valsalva as well.  Think he would benefit from surgery. Laparoscopic expiration repair of hernias found.  Does not have a concerning prostatism score. Hold off on any Flomax for now. Hopefully not an increased risk of urinary retention.  He is interested in proceeding.  Make sure he can hold his Eliquis perioperative. He just saw his cardiologist, Dr. Daneen Schick, just a couple months ago. Hopefully a quick approval for cardiac clearance versus further workup preoperatively. Patient has good exercise tolerance and is definitely healthier than the average patient for his age. Hopefully remains his risks are minimal.   The anatomy & physiology of the abdominal wall and pelvic floor was discussed. The pathophysiology of hernias in the inguinal and pelvic region was discussed. Natural history risks such as progressive enlargement, pain, incarceration, and strangulation was discussed. Contributors to complications such as smoking, obesity, diabetes, prior surgery, etc were discussed.  I feel the risks of no intervention will lead to serious problems that outweigh the operative risks; therefore, I recommended surgery to reduce and repair the hernia. I explained laparoscopic techniques with possible need for an open approach. I noted usual use of mesh to patch and/or buttress hernia repair  Risks such as bleeding, infection, abscess, need for further treatment, heart attack, death, and other risks were discussed. I noted a good likelihood this will help address the problem. Goals of  post-operative recovery were discussed as well. Possibility that this will not correct all symptoms was explained. I stressed the importance of low-impact activity, aggressive pain control, avoiding constipation, & not pushing through pain to minimize risk of post-operative chronic pain or injury. Possibility of reherniation was discussed. We will work to minimize complications.  An educational handout further explaining the pathology & treatment options was given as well. Questions were answered. The patient expresses understanding & wishes to proceed with surgery.  Pt Education - Pamphlet Given - Laparoscopic Hernia Repair: discussed with patient and provided information. Pt Education - [facility] Pain Control (Jemarcus Dougal)  Pt Education - [facility] Hernia Post-Op HCI (Neveah Bang): discussed with patient and provided information. Pt Education - [facility] Mesh education: discussed with patient and provided information.  ANTICOAGULATED (Z79.01)  Current Plans I recommended obtaining preoperative cardiac clearance. I am concerned about the health of the patient and the ability to tolerate the operation. Therefore, we will request clearance by cardiology to better assess operative risk & see if a reevaluation, further workup, etc is needed. Also recommendations on how medications such as for anticoagulation and blood pressure should be managed/held/restarted after surgery. Pt Education - [facility] Hold anticoagulation preoperatively  Adin Hector, MD, FACS, MASCRS Gastrointestinal and Minimally Invasive Surgery  [facility] [address], [place], [place] [address] [phone] Fax [phone] Main/Paging  CONTACT INFORMATION: Weekday (9AM-5PM) concerns: Call [facility] main office at [phone] Weeknight (5PM-9AM) or Weekend/Holiday concerns: Check [email or web address] for General Surgery [facility] coverage (Please, do not use SecureChat as it is not reliable communication to operating  surgeons for immediate patient care)

## 2020-01-26 NOTE — Op Note (Addendum)
01/26/2020  11:54 AM  PATIENT:  Shawn [name]  84 y.o. male  Patient Care Team: [name], MD as PCP - General (Family Medicine) [name], MD as PCP - Cardiology (Cardiology) [name], MD as Consulting Physician (General Surgery) [name], MD as Consulting Physician (Orthopedic Surgery) [name], MD as Consulting Physician (Cardiology)  PRE-OPERATIVE DIAGNOSIS:  BILATERAL INGUINAL HERNIA  POST-OPERATIVE DIAGNOSIS:  BILATERAL INGUINAL HERNIAS  PROCEDURE:   LAPAROSCOPIC BILATERAL INGUINAL HERNIA REPAIR BILATERAL TAP BLOCK  SURGEON:  Adin Hector, MD  ASSISTANT: Sheria Lang, MD, PGY 7, [employer]  ANESTHESIA:     Regional ilioinguinal and genitofemoral and spermatic cord nerve blocks  General  Nerve block provided with liposomal bupivacaine (Experel) mixed with 0.25% bupivacaine as a Bilateral TAP block x 33mL each side at the level of the transverse abdominis & preperitoneal spaces along the flank at the anterior axillary line, from subcostal ridge to iliac crest under laparoscopic guidance    EBL:  Total I/O In: 1000 [I.V.:1000] Out: 50 [Blood:50].  See anesthesia record  Delay start of Pharmacological VTE agent (>24hrs) due to surgical blood loss or risk of bleeding:  no  DRAINS: NONE  SPECIMEN:  NONE  DISPOSITION OF SPECIMEN:  N/A  COUNTS:  YES  PLAN OF CARE: Discharge to home after PACU  PATIENT DISPOSITION:  PACU - hemodynamically stable.  INDICATION:   The anatomy & physiology of the abdominal wall and pelvic floor was discussed.  The pathophysiology of hernias in the inguinal and pelvic region was discussed.  Natural history risks such as progressive enlargement, pain, incarceration & strangulation was discussed.   Contributors to complications such as smoking, obesity, diabetes, prior surgery, etc were discussed.    I feel the risks of no intervention will lead to serious problems that outweigh the operative risks;  therefore, I recommended surgery to reduce and repair the hernia.  I explained laparoscopic techniques with possible need for an open approach.  I noted usual use of mesh to patch and/or buttress hernia repair  Risks such as bleeding, infection, abscess, need for further treatment, heart attack, death, and other risks were discussed.  I noted a good likelihood this will help address the problem.   Goals of post-operative recovery were discussed as well.  Possibility that this will not correct all symptoms was explained.  I stressed the importance of low-impact activity, aggressive pain control, avoiding constipation, & not pushing through pain to minimize risk of post-operative chronic pain or injury. Possibility of reherniation was discussed.  We will work to minimize complications.     An educational handout further explaining the pathology & treatment options was given as well.  Questions were answered.  The patient expresses understanding & wishes to proceed with surgery.  OR FINDINGS: Indirect inguinal hernia on left side.  No direct space obturator or femoral hernia.  On the right small direct greater than indirect inguinal hernias.  No femoral or obturator hernias.  DESCRIPTION:  The patient was identified & brought into the operating room. The patient was positioned supine with arms tucked. SCDs were active during the entire case. The patient underwent general anesthesia without any difficulty.  The abdomen was prepped and draped in a sterile fashion. The patient's bladder was emptied.  A Surgical Timeout confirmed our plan.  I made a transverse incision through the inferior umbilical fold.  I made a small transverse nick through the anterior rectus fascia contralateral to the inguinal hernia side and placed  a 0-vicryl stitch through the fascia.  I placed a Hasson trocar into the preperitoneal plane.  Entry was clean.  We induced carbon dioxide insufflation. Camera inspection revealed no  injury.  I used a 64mm angled scope to bluntly free the peritoneum off the infraumbilical anterior abdominal wall.  I created enough of a preperitoneal pocket to place 18mm ports into the right & left mid-abdomen into this preperitoneal cavity.  I focused attention on the LEFT pelvis since that was the dominant hernia side.   I used blunt & focused sharp dissection to free the peritoneum off the flank and down to the pubic rim.  I freed the anteriolateral bladder wall off the anteriolateral pelvic wall, sparing midline attachments.   I located a swath of peritoneum going into a hernia fascial defect at the  internal ring consistent with  an indirect inguinal hernia..  I gradually freed the peritoneal hernia sac off safely and reduced it into the preperitoneal space.  I freed the peritoneum off the spermatic vessels & vas deferens.  I freed peritoneum off the retroperitoneum along the psoas muscle.  Spermatic cord lipoma was dissected away & removed.  I checked & assured hemostasis.     I turned attention on the opposite  RIGHT pelvis.  I did dissection in a similar, mirror-image fashion. The patient had small direct & indirect inguinal hernias.   Spermatic cord lipoma was dissected away & removed.    I checked & assured hemostasis.     I chose 15x15 cm sheets of ultra-lightweight polypropylene mesh (Ultrapro), one for each side.  I cut a single sigmoid-shaped slit ~6cm from a corner of each mesh.  I placed the meshes into the preperitoneal space & laid them as overlapping diamonds such that at the inferior points, a 6x6 cm corner flap rested in the true anterolateral pelvis, covering the obturator & femoral foramina.   I allowed the bladder to return to the pubis, this helping tuck the corners of the mesh in the anteriolateral pelvis.  The medial corners overlapped each other across midline cephalad to the pubic rim.   Given the numerous hernias of moderate size, I placed a third 15x15cm mesh in the center as a  vertical diamond.  The lateral wings of the mesh overlap across the direct spaces and internal rings where the dominant hernias were.  This provided good coverage and reinforcement of the hernia repairs.  Because of the central mesh placement with good overlap, I did not place any tacks.   I held the hernia sacs cephalad & evacuated carbon dioxide.  I closed the fascia with absorbable suture.  I closed the skin using 4-0 monocryl stitch.  Sterile dressings were applied.   The patient was extubated & arrived in the PACU in stable condition..  I had discussed postoperative care with the patient in the holding area.  Instructions are written in the chart.  I discussed operative findings, updated the patient's status, discussed probable steps to recovery, and gave postoperative recommendations to the patient's son, Syris Brookens.  Recommendations were made.  Questions were answered.  He expressed understanding & appreciation.   Adin Hector, M.D., F.A.C.S. Gastrointestinal and Minimally Invasive Surgery [facility] [address] [place], [place] [address] [phone] Main / Paging  01/26/2020 11:54 AM

## 2020-01-26 NOTE — Discharge Instructions (Signed)
Okay to restart your home medications in the morning, even your Eliquis blood thinner  HERNIA REPAIR: POST OP INSTRUCTIONS  ######################################################################  EAT Gradually transition to a high fiber diet with a fiber supplement over the next few weeks after discharge.  Start with a pureed / full liquid diet (see below)  WALK Walk an hour a day.  Control your pain to do that.    CONTROL PAIN Control pain so that you can walk, sleep, tolerate sneezing/coughing, and go up/down stairs.  HAVE A BOWEL MOVEMENT DAILY Keep your bowels regular to avoid problems.  OK to try a laxative to override constipation.  OK to use an antidairrheal to slow down diarrhea.  Call if not better after 2 tries  CALL IF YOU HAVE PROBLEMS/CONCERNS Call if you are still struggling despite following these instructions. Call if you have concerns not answered by these instructions  ######################################################################    1. DIET: Follow a light bland diet & liquids the first 24 hours after arrival home, such as soup, liquids, starches, etc.  Be sure to drink plenty of fluids.  Quickly advance to a usual solid diet within a few days.  Avoid fast food or heavy meals as your are more likely to get nauseated or have irregular bowels.  A low-fat, high-fiber diet for the rest of your life is ideal.   2. Take your usually prescribed home medications unless otherwise directed.  3. PAIN CONTROL: a. Pain is best controlled by a usual combination of three different methods TOGETHER: i. Ice/Heat ii. Over the counter pain medication iii. Prescription pain medication b. Most patients will experience some swelling and bruising around the hernia(s) such as the bellybutton, groins, or old incisions.  Ice packs or heating pads (30-60 minutes up to 6 times a day) will help. Use ice for the first few days to help decrease swelling and bruising, then switch to  heat to help relax tight/sore spots and speed recovery.  Some people prefer to use ice alone, heat alone, alternating between ice & heat.  Experiment to what works for you.  Swelling and bruising can take several weeks to resolve.   c. It is helpful to take an over-the-counter pain medication regularly for the first few weeks.  Choose one of the following that works best for you: i. Naproxen (Aleve, etc)  Two 220mg  tabs twice a day ii. Ibuprofen (Advil, etc) Three 200mg  tabs four times a day (every meal & bedtime) iii. Acetaminophen (Tylenol, etc) 325-650mg  four times a day (every meal & bedtime) d. A  prescription for pain medication should be given to you upon discharge.  Take your pain medication as prescribed.  i. If you are having problems/concerns with the prescription medicine (does not control pain, nausea, vomiting, rash, itching, etc), please call us [phone] to see if we need to switch you to a different pain medicine that will work better for you and/or control your side effect better. ii. If you need a refill on your pain medication, please contact your pharmacy.  They will contact our office to request authorization. Prescriptions will not be filled after 5 pm or on week-ends.  4. Avoid getting constipated.  Between the surgery and the pain medications, it is common to experience some constipation.  Increasing fluid intake and taking a fiber supplement (such as Metamucil, Citrucel, FiberCon, MiraLax, etc) 1-2 times a day regularly will usually help prevent this problem from occurring.  A mild laxative (prune juice, Milk of Magnesia, MiraLax, etc)  should be taken according to package directions if there are no bowel movements after 48 hours.    5. Wash / shower every day.  You may shower over the dressings as they are waterproof.    6. Remove your waterproof bandages, skin tapes, and other bandages 3 days after surgery. You may replace a dressing/Band-Aid to cover the incision for  comfort if you wish. You may leave the incisions open to air.  You may replace a dressing/Band-Aid to cover an incision for comfort if you wish.  Continue to shower over incision(s) after the dressing is off.  7. ACTIVITIES as tolerated:   a. You may resume regular (light) daily activities beginning the next day--such as daily self-care, walking, climbing stairs--gradually increasing activities as tolerated.  Control your pain so that you can walk an hour a day.  If you can walk 30 minutes without difficulty, it is safe to try more intense activity such as jogging, treadmill, bicycling, low-impact aerobics, swimming, etc. b. Save the most intensive and strenuous activity for last such as sit-ups, heavy lifting, contact sports, etc  Refrain from any heavy lifting or straining until you are off narcotics for pain control.   c. DO NOT PUSH THROUGH PAIN.  Let pain be your guide: If it hurts to do something, don't do it.  Pain is your body warning you to avoid that activity for another week until the pain goes down. d. You may drive when you are no longer taking prescription pain medication, you can comfortably wear a seatbelt, and you can safely maneuver your car and apply brakes. e. Dennis Bast may have sexual intercourse when it is comfortable.   8. FOLLOW UP in our office a. Please call [facility] at [phone] to set up an appointment to see your surgeon in the office for a follow-up appointment approximately 2-3 weeks after your surgery. b. Make sure that you call for this appointment the day you arrive home to insure a convenient appointment time.  9.  If you have disability of FMLA / Family leave forms, please bring the forms to the office for processing.  (do not give to your surgeon).  WHEN TO CALL us [phone]: 1. Poor pain control 2. Reactions / problems with new medications (rash/itching, nausea, etc)  3. Fever over 101.5 F (38.5 C) 4. Inability to urinate 5. Nausea and/or  vomiting 6. Worsening swelling or bruising 7. Continued bleeding from incision. 8. Increased pain, redness, or drainage from the incision   The clinic staff is available to answer your questions during regular business hours (8:30am-5pm).  Please don't hesitate to call and ask to speak to one of our nurses for clinical concerns.   If you have a medical emergency, go to the nearest emergency room or call 911.  A surgeon from [facility] is always on call at the hospitals in [facility], [place], [place], [place], [place]  [address] ?  [address] [address], [place], [place]   [address] MAIN: [phone] ? TOLL FREE: [phone] ? FAX: [phone] [email or web address]    Inguinal Hernia, Adult An inguinal hernia develops when fat or the intestines push through a weak spot in a muscle where your leg meets your lower abdomen (groin). This creates a bulge. This kind of hernia could also be:  In your scrotum, if you are male.  In folds of skin around your vagina, if you are male. There are three types of inguinal  hernias:  Hernias that can be pushed back into the abdomen (are reducible). This type rarely causes pain.  Hernias that are not reducible (are incarcerated).  Hernias that are not reducible and lose their blood supply (are strangulated). This type of hernia requires emergency surgery.

## 2020-01-27 ENCOUNTER — Encounter (HOSPITAL_COMMUNITY): Payer: Self-pay | Admitting: Surgery

## 2020-01-27 NOTE — Anesthesia Postprocedure Evaluation (Signed)
Anesthesia Post Note  Patient: Shawn [name]  Procedure(s) Performed: LAPAROSCOPIC BILATERAL INGUINAL HERNIA REPAIR, BILATERAL TAP BLOCK (N/A Abdomen)     Patient location during evaluation: PACU Anesthesia Type: General Level of consciousness: awake and alert Pain management: pain level controlled Vital Signs Assessment: post-procedure vital signs reviewed and stable Respiratory status: spontaneous breathing, nonlabored ventilation, respiratory function stable and patient connected to nasal cannula oxygen Cardiovascular status: blood pressure returned to baseline and stable Postop Assessment: no apparent nausea or vomiting Anesthetic complications: no   No complications documented.  Last Vitals:  Vitals:   01/26/20 1408 01/26/20 1449  BP: 139/61 108/61  Pulse: 73 74  Resp: 18 14  Temp: 36.5 C 36.6 C  SpO2: 98% 100%    Last Pain:  Vitals:   01/26/20 1449  TempSrc: Oral  PainSc:                  Tiajuana Amass

## 2020-02-02 ENCOUNTER — Encounter (HOSPITAL_COMMUNITY): Payer: Self-pay | Admitting: Surgery

## 2020-03-12 DIAGNOSIS — E1159 Type 2 diabetes mellitus with other circulatory complications: Secondary | ICD-10-CM | POA: Diagnosis not present

## 2020-03-12 DIAGNOSIS — E7800 Pure hypercholesterolemia, unspecified: Secondary | ICD-10-CM | POA: Diagnosis not present

## 2020-03-12 DIAGNOSIS — I10 Essential (primary) hypertension: Secondary | ICD-10-CM | POA: Diagnosis not present

## 2020-03-12 DIAGNOSIS — M170 Bilateral primary osteoarthritis of knee: Secondary | ICD-10-CM | POA: Diagnosis not present

## 2020-03-12 DIAGNOSIS — I25118 Atherosclerotic heart disease of native coronary artery with other forms of angina pectoris: Secondary | ICD-10-CM | POA: Diagnosis not present

## 2020-03-12 DIAGNOSIS — I2510 Atherosclerotic heart disease of native coronary artery without angina pectoris: Secondary | ICD-10-CM | POA: Diagnosis not present

## 2020-03-12 DIAGNOSIS — I252 Old myocardial infarction: Secondary | ICD-10-CM | POA: Diagnosis not present

## 2020-03-12 DIAGNOSIS — I480 Paroxysmal atrial fibrillation: Secondary | ICD-10-CM | POA: Diagnosis not present

## 2020-03-12 DIAGNOSIS — E785 Hyperlipidemia, unspecified: Secondary | ICD-10-CM | POA: Diagnosis not present

## 2020-03-12 DIAGNOSIS — I25769 Atherosclerosis of bypass graft of coronary artery of transplanted heart with unspecified angina pectoris: Secondary | ICD-10-CM | POA: Diagnosis not present

## 2020-03-12 DIAGNOSIS — I25110 Atherosclerotic heart disease of native coronary artery with unstable angina pectoris: Secondary | ICD-10-CM | POA: Diagnosis not present

## 2020-03-12 DIAGNOSIS — I5022 Chronic systolic (congestive) heart failure: Secondary | ICD-10-CM | POA: Diagnosis not present

## 2020-05-23 DIAGNOSIS — M170 Bilateral primary osteoarthritis of knee: Secondary | ICD-10-CM | POA: Diagnosis not present

## 2020-05-28 DIAGNOSIS — I5022 Chronic systolic (congestive) heart failure: Secondary | ICD-10-CM | POA: Diagnosis not present

## 2020-05-28 DIAGNOSIS — I10 Essential (primary) hypertension: Secondary | ICD-10-CM | POA: Diagnosis not present

## 2020-05-28 DIAGNOSIS — M170 Bilateral primary osteoarthritis of knee: Secondary | ICD-10-CM | POA: Diagnosis not present

## 2020-05-28 DIAGNOSIS — I2510 Atherosclerotic heart disease of native coronary artery without angina pectoris: Secondary | ICD-10-CM | POA: Diagnosis not present

## 2020-05-28 DIAGNOSIS — E785 Hyperlipidemia, unspecified: Secondary | ICD-10-CM | POA: Diagnosis not present

## 2020-05-28 DIAGNOSIS — I25769 Atherosclerosis of bypass graft of coronary artery of transplanted heart with unspecified angina pectoris: Secondary | ICD-10-CM | POA: Diagnosis not present

## 2020-05-28 DIAGNOSIS — I480 Paroxysmal atrial fibrillation: Secondary | ICD-10-CM | POA: Diagnosis not present

## 2020-05-28 DIAGNOSIS — E1159 Type 2 diabetes mellitus with other circulatory complications: Secondary | ICD-10-CM | POA: Diagnosis not present

## 2020-05-30 DIAGNOSIS — M170 Bilateral primary osteoarthritis of knee: Secondary | ICD-10-CM | POA: Diagnosis not present

## 2020-06-06 ENCOUNTER — Other Ambulatory Visit: Payer: Self-pay

## 2020-06-06 DIAGNOSIS — M170 Bilateral primary osteoarthritis of knee: Secondary | ICD-10-CM | POA: Diagnosis not present

## 2020-06-06 MED ORDER — ISOSORBIDE MONONITRATE ER 30 MG PO TB24
30.0000 mg | ORAL_TABLET | Freq: Every day | ORAL | 0 refills | Status: DC
Start: 2020-06-06 — End: 2020-09-14

## 2020-06-06 MED ORDER — ISOSORBIDE MONONITRATE ER 30 MG PO TB24
30.0000 mg | ORAL_TABLET | Freq: Every day | ORAL | 0 refills | Status: DC
Start: 2020-06-06 — End: 2020-06-06

## 2020-06-12 DIAGNOSIS — Z8551 Personal history of malignant neoplasm of bladder: Secondary | ICD-10-CM | POA: Diagnosis not present

## 2020-06-26 DIAGNOSIS — M170 Bilateral primary osteoarthritis of knee: Secondary | ICD-10-CM | POA: Diagnosis not present

## 2020-06-26 DIAGNOSIS — Z8551 Personal history of malignant neoplasm of bladder: Secondary | ICD-10-CM | POA: Diagnosis not present

## 2020-06-26 DIAGNOSIS — E7800 Pure hypercholesterolemia, unspecified: Secondary | ICD-10-CM | POA: Diagnosis not present

## 2020-06-26 DIAGNOSIS — I5022 Chronic systolic (congestive) heart failure: Secondary | ICD-10-CM | POA: Diagnosis not present

## 2020-06-26 DIAGNOSIS — R319 Hematuria, unspecified: Secondary | ICD-10-CM | POA: Diagnosis not present

## 2020-06-26 DIAGNOSIS — I480 Paroxysmal atrial fibrillation: Secondary | ICD-10-CM | POA: Diagnosis not present

## 2020-06-26 DIAGNOSIS — M109 Gout, unspecified: Secondary | ICD-10-CM | POA: Diagnosis not present

## 2020-06-26 DIAGNOSIS — E1159 Type 2 diabetes mellitus with other circulatory complications: Secondary | ICD-10-CM | POA: Diagnosis not present

## 2020-06-26 DIAGNOSIS — D6869 Other thrombophilia: Secondary | ICD-10-CM | POA: Diagnosis not present

## 2020-06-26 DIAGNOSIS — I10 Essential (primary) hypertension: Secondary | ICD-10-CM | POA: Diagnosis not present

## 2020-06-26 DIAGNOSIS — I25769 Atherosclerosis of bypass graft of coronary artery of transplanted heart with unspecified angina pectoris: Secondary | ICD-10-CM | POA: Diagnosis not present

## 2020-07-09 ENCOUNTER — Other Ambulatory Visit: Payer: Self-pay

## 2020-07-09 MED ORDER — ELIQUIS 5 MG PO TABS
ORAL_TABLET | ORAL | 1 refills | Status: DC
Start: 2020-07-09 — End: 2020-12-31

## 2020-07-09 NOTE — Telephone Encounter (Signed)
Prescription refill request for Eliquis received. Indication: afib  Last office visit: Tamala Julian 09/14/2019 Scr: 0.93, 01/18/2020 Age: 85 yo  Weight: 86.6 kg   Pt is on the correct dose of Eliquis per dosing criteria, prescription refill sent for Eliquis 5mg  BID.

## 2020-09-14 ENCOUNTER — Other Ambulatory Visit: Payer: Self-pay | Admitting: Interventional Cardiology

## 2020-11-10 NOTE — Progress Notes (Signed)
Cardiology Office Note:    Date:  11/12/2020   ID:  Shawn [name], DOB 1935/10/15, MRN [id number]  PCP:  [name], MD  Cardiologist:  Sinclair Grooms, MD   Referring MD: [name], MD   Chief Complaint  Patient presents with   Coronary Artery Disease   Hypertension   Atrial Fibrillation     History of Present Illness:    Shawn [name] is a 85 y.o. male with a hx of paroxysmal atrial fib, sinus bradycardia (HR 59 in 2015), CAD (s/p CABG in 1992, stenting to Cx 2004/2005, BMS-SVG-LAD 2008, MI 2011 with stents to the SVG to LAD, repeat CABG 2012, ischemic cardiomyopathy (EF 45-50% by cath 2015), hypertension, hyperlipidemia, and history of remote bladder cancer. Continues to smoke cigars.   Not plan cough is much because of difficulty with his knees.  Still tries to get exercise in both of the gym and with walking.  Does admit that his walking pace is significantly slowed compared to prior.  With physical activity he is not having angina or excessive dyspnea.  He denies orthopnea, PND, but does get left lower extremity edema after he is up and moving around in the mornings.  The left lower extremity edema is chronic.  He denies medication side effects.  Past Medical History:  Diagnosis Date   Anterior myocardial infarction ([place]) 09/2009   [name] 04/22/2010   Arthritis    "knees" (11/29/2013)   Bladder cancer ([place]) 6295   Chronic systolic CHF (congestive heart failure) ([place])    Coronary artery disease    a. s/p CABG in 1992. b. stenting to Cx 2004/2005. c. BMS-SVG-LAD 2008. d. MI 2011 s/p stents to SVG-LAD. e. redo CABGx2 in 2012. f. [place] 2015: med rx.   Essential hypertension    Hyperglycemia    Hyperlipidemia    Hypotension    a. ACEI stopped 09/2015 due to this.   Ischemic cardiomyopathy    a. EF 40-45% by [place] 2015.   PAF (paroxysmal atrial fibrillation) ([facility])    Sinus bradycardia    a. HR 50s on prior EKG.    Past Surgical History:  Procedure  Laterality Date   APPENDECTOMY  1940's   CARDIAC CATHETERIZATION  09/2006   [name] 06/20/2010   CATARACT EXTRACTION W/ INTRAOCULAR LENS  IMPLANT, BILATERAL Bilateral 1990's   CORONARY ANGIOPLASTY WITH STENT PLACEMENT  "several since 1992"   [place]; ~2012   "CABG X3; CABH X 3"   INGUINAL HERNIA REPAIR N/A 01/26/2020   Procedure: LAPAROSCOPIC BILATERAL INGUINAL HERNIA REPAIR, BILATERAL TAP BLOCK;  Surgeon: [name], MD;  Location: [facility];  Service: General;  Laterality: N/A;   JOINT REPLACEMENT     LEFT HEART CATHETERIZATION WITH CORONARY/GRAFT ANGIOGRAM N/A 11/30/2013   Procedure: LEFT HEART CATHETERIZATION WITH Beatrix Fetters;  Surgeon: Troy Sine, MD;  Location: [facility] C[facility] LAB;  Service: Cardiovascular;  Laterality: N/A;   TONSILLECTOMY  1940's   TOTAL HIP ARTHROPLASTY Left 1992   TOTAL HIP ARTHROPLASTY Right ~ 1994    Current Medications: Current Meds  Medication Sig   apixaban (ELIQUIS) 5 MG TABS tablet TAKE 1 TABLET(5 MG) BY MOUTH TWICE DAILY   atorvastatin (LIPITOR) 80 MG tablet TAKE 1 TABLET(80 MG) BY MOUTH EVERY DAY AT 6 PM   cholecalciferol (VITAMIN D3) 25 MCG (1000 UNIT) tablet Take 1,000 Units by mouth daily.   clindamycin (CLEOCIN) 150 MG capsule Take 150 mg by mouth daily.   colchicine 0.6  MG tablet Take 0.6 mg by mouth 2 (two) times daily as needed (gout).    isosorbide mononitrate (IMDUR) 30 MG 24 hr tablet Take 1 tablet (30 mg total) by mouth daily.   metFORMIN (GLUCOPHAGE-XR) 500 MG 24 hr tablet Take 500 mg by mouth daily.   nitroGLYCERIN (NITROSTAT) 0.4 MG SL tablet Place 1 tablet (0.4 mg total) under the tongue every 5 (five) minutes x 3 doses as needed for chest pain.   [DISCONTINUED] atenolol (TENORMIN) 25 MG tablet TAKE 1 TABLET BY MOUTH EVERY DAY     Allergies:   Penicillins   Social History   Socioeconomic History   Marital status: Married    Spouse name: Not on file   Number of children: Not on file   Years of  education: Not on file   Highest education level: Not on file  Occupational History   Not on file  Tobacco Use   Smoking status: Light Smoker    Years: 58.00    Types: Cigars, Cigarettes    Start date: 02/17/1973   Smokeless tobacco: Never   Tobacco comments:    11/29/2013 "I smoke 1 cigar/day now; stopped smoking cigarettes"  Vaping Use   Vaping Use: Never used  Substance and Sexual Activity   Alcohol use: No   Drug use: No   Sexual activity: Yes  Other Topics Concern   Not on file  Social History Narrative   Not on file   Social Determinants of Health   Financial Resource Strain: Not on file  Food Insecurity: Not on file  Transportation Needs: Not on file  Physical Activity: Not on file  Stress: Not on file  Social Connections: Not on file     Family History: The patient's family history includes Heart disease in his father.  ROS:   Please see the history of present illness.    Musculoskeletal discomfort including his back, legs, and hands.  All other systems reviewed and are negative.  EKGs/Labs/Other Studies Reviewed:    The following studies were reviewed today: No new cardiac imaging  EKG:  EKG is compared to 09/15/2019.  Current tracing reveals atrial flutter with variable AV block and ventricular response of 62 bpm.  QRS duration is 126 ms with left bundle branch block (atypical).  Compared to the prior tracing QRS duration is increased.  Recent Labs: 01/18/2020: BUN 16; Creatinine, Ser 0.93; Hemoglobin 15.0; Platelets 188; Potassium 4.4; Sodium 140  Recent Lipid Panel    Component Value Date/Time   CHOL 98 (L) 08/09/2018 0917   TRIG 109 08/09/2018 0917   HDL 37 (L) 08/09/2018 0917   CHOLHDL 2.6 08/09/2018 0917   CHOLHDL 3.7 11/30/2013 0400   VLDL 18 11/30/2013 0400   LDLCALC 39 08/09/2018 0917    Physical Exam:    VS:  BP (!) 100/58   Pulse 62   Ht 6\' 5"  (1.956 m)   Wt 186 lb (84.4 kg)   SpO2 97%   BMI 22.06 kg/m     Wt Readings from Last 3  Encounters:  11/12/20 186 lb (84.4 kg)  01/26/20 191 lb (86.6 kg)  01/18/20 191 lb (86.6 kg)     GEN: Slender. No acute distress HEENT: Normal NECK: No JVD. LYMPHATICS: No lymphadenopathy CARDIAC: No murmur. RRR no gallop, or edema. VASCULAR:  Normal Pulses. No bruits. RESPIRATORY:  Clear to auscultation without rales, wheezing or rhonchi  ABDOMEN: Soft, non-tender, non-distended, No pulsatile mass, MUSCULOSKELETAL: No deformity  SKIN: Warm and dry NEUROLOGIC:  Alert and oriented x 3 PSYCHIATRIC:  Normal affect   ASSESSMENT:    1. Coronary artery disease of bypass graft of native heart with stable angina pectoris ([place])   2. PAF (paroxysmal atrial fibrillation) ([place])   3. Essential hypertension   4. Mixed hyperlipidemia   5. Chronic systolic CHF (congestive heart failure) ([place])   6. Chronic anticoagulation    PLAN:    In order of problems listed above:  Secondary prevention discussed. In chronic atrial flutter with good rate control.  Has now developed left bundle.  Decrease atenolol to 12.5 mg daily. Blood pressure is almost too low.  Decrease atenolol to 12.5 mg daily. Continue atorvastatin 80 mg/day. Needs f/u LV assessment at some point although did not order it today because the patient is doing so well and has normal cardiac exam.  Continue current therapy which includes atenolol and isosorbide mononitrate. At least once per year he needs a hemoglobin and kidney profile.   Overall education and awareness concerning secondary risk prevention was discussed in detail: LDL less than 70, hemoglobin A1c less than 7, blood pressure target less than 130/80 mmHg, >150 minutes of moderate aerobic activity per week, avoidance of smoking, weight control (via diet and exercise), and continued surveillance/management of/for obstructive sleep apnea.    Medication Adjustments/Labs and Tests Ordered: Current medicines are reviewed at length with the patient today.  Concerns regarding  medicines are outlined above.  Orders Placed This Encounter  Procedures   EKG 12-Lead    Meds ordered this encounter  Medications   atenolol (TENORMIN) 25 MG tablet    Sig: Take 0.5 tablets (12.5 mg total) by mouth daily.    Dispense:  45 tablet    Refill:  3    Dose change     Patient Instructions  Medication Instructions:  1) DECREASE Atenolol to 12.5mg  once daily.  *If you need a refill on your cardiac medications before your next appointment, please call your pharmacy*   Lab Work: None If you have labs (blood work) drawn today and your tests are completely normal, you will receive your results only by: [place] (if you have MyChart) OR A paper copy in the mail If you have any lab test that is abnormal or we need to change your treatment, we will call you to review the results.   Testing/Procedures: None   Follow-Up: At [facility], you and your health needs are our priority.  As part of our continuing mission to provide you with exceptional heart care, we have created designated Provider Care Teams.  These Care Teams include your primary Cardiologist (physician) and Advanced Practice Providers (APPs -  Physician Assistants and Nurse Practitioners) who all work together to provide you with the care you need, when you need it.  We recommend signing up for the patient portal called "MyChart".  Sign up information is provided on this After Visit Summary.  MyChart is used to connect with patients for Virtual Visits (Telemedicine).  Patients are able to view lab/test results, encounter notes, upcoming appointments, etc.  Non-urgent messages can be sent to your provider as well.   To learn more about what you can do with MyChart, go to [email or web address].    Your next appointment:   1 year(s)  The format for your next appointment:   In Person  Provider:   You may see Sinclair Grooms, MD or one of the following Advanced Practice Providers on your  designated Care Team:   Mickel Baas  Dorene Ar, NP   Other Instructions     Signed, Sinclair Grooms, MD  11/12/2020 10:26 AM    [facility]

## 2020-11-12 ENCOUNTER — Ambulatory Visit (INDEPENDENT_AMBULATORY_CARE_PROVIDER_SITE_OTHER): Payer: Medicare Other | Admitting: Interventional Cardiology

## 2020-11-12 ENCOUNTER — Encounter: Payer: Self-pay | Admitting: Interventional Cardiology

## 2020-11-12 ENCOUNTER — Other Ambulatory Visit: Payer: Self-pay

## 2020-11-12 VITALS — BP 100/58 | HR 62 | Ht 77.0 in | Wt 186.0 lb

## 2020-11-12 DIAGNOSIS — I480 Paroxysmal atrial fibrillation: Secondary | ICD-10-CM | POA: Diagnosis not present

## 2020-11-12 DIAGNOSIS — I10 Essential (primary) hypertension: Secondary | ICD-10-CM | POA: Diagnosis not present

## 2020-11-12 DIAGNOSIS — I5022 Chronic systolic (congestive) heart failure: Secondary | ICD-10-CM

## 2020-11-12 DIAGNOSIS — Z7901 Long term (current) use of anticoagulants: Secondary | ICD-10-CM

## 2020-11-12 DIAGNOSIS — I25708 Atherosclerosis of coronary artery bypass graft(s), unspecified, with other forms of angina pectoris: Secondary | ICD-10-CM

## 2020-11-12 DIAGNOSIS — E782 Mixed hyperlipidemia: Secondary | ICD-10-CM

## 2020-11-12 MED ORDER — ATENOLOL 25 MG PO TABS
12.5000 mg | ORAL_TABLET | Freq: Every day | ORAL | 3 refills | Status: DC
Start: 2020-11-12 — End: 2023-08-13

## 2020-11-12 NOTE — Patient Instructions (Signed)
Medication Instructions:  1) DECREASE Atenolol to 12.5mg  once daily.  *If you need a refill on your cardiac medications before your next appointment, please call your pharmacy*   Lab Work: None If you have labs (blood work) drawn today and your tests are completely normal, you will receive your results only by: [place] (if you have MyChart) OR A paper copy in the mail If you have any lab test that is abnormal or we need to change your treatment, we will call you to review the results.   Testing/Procedures: None   Follow-Up: At [facility], you and your health needs are our priority.  As part of our continuing mission to provide you with exceptional heart care, we have created designated Provider Care Teams.  These Care Teams include your primary Cardiologist (physician) and Advanced Practice Providers (APPs -  Physician Assistants and Nurse Practitioners) who all work together to provide you with the care you need, when you need it.  We recommend signing up for the patient portal called "MyChart".  Sign up information is provided on this After Visit Summary.  MyChart is used to connect with patients for Virtual Visits (Telemedicine).  Patients are able to view lab/test results, encounter notes, upcoming appointments, etc.  Non-urgent messages can be sent to your provider as well.   To learn more about what you can do with MyChart, go to [email or web address].    Your next appointment:   1 year(s)  The format for your next appointment:   In Person  Provider:   You may see Sinclair Grooms, MD or one of the following Advanced Practice Providers on your designated Care Team:   Cecilie Kicks, NP   Other Instructions

## 2020-12-05 DIAGNOSIS — I25769 Atherosclerosis of bypass graft of coronary artery of transplanted heart with unspecified angina pectoris: Secondary | ICD-10-CM | POA: Diagnosis not present

## 2020-12-05 DIAGNOSIS — M170 Bilateral primary osteoarthritis of knee: Secondary | ICD-10-CM | POA: Diagnosis not present

## 2020-12-05 DIAGNOSIS — I25110 Atherosclerotic heart disease of native coronary artery with unstable angina pectoris: Secondary | ICD-10-CM | POA: Diagnosis not present

## 2020-12-05 DIAGNOSIS — I25118 Atherosclerotic heart disease of native coronary artery with other forms of angina pectoris: Secondary | ICD-10-CM | POA: Diagnosis not present

## 2020-12-05 DIAGNOSIS — E1159 Type 2 diabetes mellitus with other circulatory complications: Secondary | ICD-10-CM | POA: Diagnosis not present

## 2020-12-05 DIAGNOSIS — I10 Essential (primary) hypertension: Secondary | ICD-10-CM | POA: Diagnosis not present

## 2020-12-05 DIAGNOSIS — I252 Old myocardial infarction: Secondary | ICD-10-CM | POA: Diagnosis not present

## 2020-12-05 DIAGNOSIS — E785 Hyperlipidemia, unspecified: Secondary | ICD-10-CM | POA: Diagnosis not present

## 2020-12-05 DIAGNOSIS — E7800 Pure hypercholesterolemia, unspecified: Secondary | ICD-10-CM | POA: Diagnosis not present

## 2020-12-05 DIAGNOSIS — I5022 Chronic systolic (congestive) heart failure: Secondary | ICD-10-CM | POA: Diagnosis not present

## 2020-12-05 DIAGNOSIS — I480 Paroxysmal atrial fibrillation: Secondary | ICD-10-CM | POA: Diagnosis not present

## 2020-12-17 ENCOUNTER — Other Ambulatory Visit: Payer: Self-pay | Admitting: Interventional Cardiology

## 2020-12-17 MED ORDER — ATORVASTATIN CALCIUM 80 MG PO TABS
ORAL_TABLET | ORAL | 3 refills | Status: DC
Start: 2020-12-17 — End: 2021-12-25

## 2020-12-31 ENCOUNTER — Other Ambulatory Visit: Payer: Self-pay | Admitting: Interventional Cardiology

## 2020-12-31 NOTE — Telephone Encounter (Signed)
Prescription refill request for Eliquis received. Indication: Afib  Last office visit:11/12/20 Tamala Julian)  Scr: 0.93 (01/18/20)  Age: 85 Weight: 84.4kg  Appropriate dose and refill sent to requested pharmacy.

## 2021-01-02 DIAGNOSIS — Z23 Encounter for immunization: Secondary | ICD-10-CM | POA: Diagnosis not present

## 2021-01-07 DIAGNOSIS — Z23 Encounter for immunization: Secondary | ICD-10-CM | POA: Diagnosis not present

## 2021-01-16 DIAGNOSIS — I5022 Chronic systolic (congestive) heart failure: Secondary | ICD-10-CM | POA: Diagnosis not present

## 2021-01-16 DIAGNOSIS — I10 Essential (primary) hypertension: Secondary | ICD-10-CM | POA: Diagnosis not present

## 2021-01-16 DIAGNOSIS — M170 Bilateral primary osteoarthritis of knee: Secondary | ICD-10-CM | POA: Diagnosis not present

## 2021-01-16 DIAGNOSIS — Z0000 Encounter for general adult medical examination without abnormal findings: Secondary | ICD-10-CM | POA: Diagnosis not present

## 2021-01-16 DIAGNOSIS — R319 Hematuria, unspecified: Secondary | ICD-10-CM | POA: Diagnosis not present

## 2021-01-16 DIAGNOSIS — E1159 Type 2 diabetes mellitus with other circulatory complications: Secondary | ICD-10-CM | POA: Diagnosis not present

## 2021-01-16 DIAGNOSIS — Z1389 Encounter for screening for other disorder: Secondary | ICD-10-CM | POA: Diagnosis not present

## 2021-01-16 DIAGNOSIS — D6869 Other thrombophilia: Secondary | ICD-10-CM | POA: Diagnosis not present

## 2021-01-16 DIAGNOSIS — I480 Paroxysmal atrial fibrillation: Secondary | ICD-10-CM | POA: Diagnosis not present

## 2021-01-16 DIAGNOSIS — M109 Gout, unspecified: Secondary | ICD-10-CM | POA: Diagnosis not present

## 2021-01-16 DIAGNOSIS — I25769 Atherosclerosis of bypass graft of coronary artery of transplanted heart with unspecified angina pectoris: Secondary | ICD-10-CM | POA: Diagnosis not present

## 2021-01-16 DIAGNOSIS — E7800 Pure hypercholesterolemia, unspecified: Secondary | ICD-10-CM | POA: Diagnosis not present

## 2021-03-19 ENCOUNTER — Other Ambulatory Visit: Payer: Self-pay | Admitting: *Deleted

## 2021-03-19 MED ORDER — ISOSORBIDE MONONITRATE ER 30 MG PO TB24: 30.0000 mg | ORAL_TABLET | Freq: Every day | ORAL | 3 refills | Status: DC

## 2021-03-27 DIAGNOSIS — M545 Low back pain, unspecified: Secondary | ICD-10-CM | POA: Diagnosis not present

## 2021-03-27 DIAGNOSIS — M25551 Pain in right hip: Secondary | ICD-10-CM | POA: Diagnosis not present

## 2021-03-29 DIAGNOSIS — I5022 Chronic systolic (congestive) heart failure: Secondary | ICD-10-CM | POA: Diagnosis not present

## 2021-03-29 DIAGNOSIS — E1159 Type 2 diabetes mellitus with other circulatory complications: Secondary | ICD-10-CM | POA: Diagnosis not present

## 2021-03-29 DIAGNOSIS — I10 Essential (primary) hypertension: Secondary | ICD-10-CM | POA: Diagnosis not present

## 2021-03-29 DIAGNOSIS — E7800 Pure hypercholesterolemia, unspecified: Secondary | ICD-10-CM | POA: Diagnosis not present

## 2021-04-25 DIAGNOSIS — M25551 Pain in right hip: Secondary | ICD-10-CM | POA: Diagnosis not present

## 2021-06-20 ENCOUNTER — Other Ambulatory Visit: Payer: Self-pay | Admitting: Interventional Cardiology

## 2021-06-20 DIAGNOSIS — I480 Paroxysmal atrial fibrillation: Secondary | ICD-10-CM

## 2021-06-20 NOTE — Telephone Encounter (Signed)
Eliquis '5mg'$  refill request received. Patient is 86 years old, weight-84.4kg, Crea-0.95 on 01/16/2021 via [facility] from [place], [place], and last seen by Dr. Tamala Julian on 11/12/2020. Dose is appropriate based on dosing criteria. Will send in refill to requested pharmacy.   ?

## 2021-12-16 DIAGNOSIS — Z23 Encounter for immunization: Secondary | ICD-10-CM | POA: Diagnosis not present

## 2021-12-25 ENCOUNTER — Other Ambulatory Visit: Payer: Self-pay

## 2021-12-25 MED ORDER — ATORVASTATIN CALCIUM 80 MG PO TABS: ORAL_TABLET | ORAL | 0 refills | Status: DC

## 2022-01-28 ENCOUNTER — Encounter: Payer: Self-pay | Admitting: Cardiovascular Disease

## 2022-01-28 DIAGNOSIS — I5022 Chronic systolic (congestive) heart failure: Secondary | ICD-10-CM | POA: Diagnosis not present

## 2022-01-28 DIAGNOSIS — Z23 Encounter for immunization: Secondary | ICD-10-CM | POA: Diagnosis not present

## 2022-01-28 DIAGNOSIS — Z79899 Other long term (current) drug therapy: Secondary | ICD-10-CM | POA: Diagnosis not present

## 2022-01-28 DIAGNOSIS — E7800 Pure hypercholesterolemia, unspecified: Secondary | ICD-10-CM | POA: Diagnosis not present

## 2022-01-28 DIAGNOSIS — L989 Disorder of the skin and subcutaneous tissue, unspecified: Secondary | ICD-10-CM | POA: Diagnosis not present

## 2022-01-28 DIAGNOSIS — I25110 Atherosclerotic heart disease of native coronary artery with unstable angina pectoris: Secondary | ICD-10-CM | POA: Diagnosis not present

## 2022-01-28 DIAGNOSIS — Z0000 Encounter for general adult medical examination without abnormal findings: Secondary | ICD-10-CM | POA: Diagnosis not present

## 2022-01-28 DIAGNOSIS — E1159 Type 2 diabetes mellitus with other circulatory complications: Secondary | ICD-10-CM | POA: Diagnosis not present

## 2022-01-28 DIAGNOSIS — I480 Paroxysmal atrial fibrillation: Secondary | ICD-10-CM | POA: Diagnosis not present

## 2022-02-05 ENCOUNTER — Ambulatory Visit: Payer: Medicare Other | Admitting: Cardiovascular Disease

## 2022-03-24 ENCOUNTER — Other Ambulatory Visit: Payer: Self-pay

## 2022-03-24 ENCOUNTER — Other Ambulatory Visit: Payer: Self-pay | Admitting: *Deleted

## 2022-03-24 DIAGNOSIS — R5383 Other fatigue: Secondary | ICD-10-CM | POA: Diagnosis not present

## 2022-03-24 DIAGNOSIS — B370 Candidal stomatitis: Secondary | ICD-10-CM | POA: Diagnosis not present

## 2022-03-24 DIAGNOSIS — I480 Paroxysmal atrial fibrillation: Secondary | ICD-10-CM

## 2022-03-24 DIAGNOSIS — R051 Acute cough: Secondary | ICD-10-CM | POA: Diagnosis not present

## 2022-03-24 MED ORDER — APIXABAN 5 MG PO TABS: ORAL_TABLET | ORAL | 0 refills | Status: DC

## 2022-03-24 MED ORDER — ISOSORBIDE MONONITRATE ER 30 MG PO TB24: 30.0000 mg | ORAL_TABLET | Freq: Every day | ORAL | 0 refills | Status: DC

## 2022-03-24 NOTE — Telephone Encounter (Signed)
Prescription refill request for Eliquis received. Indication:afib Last office visit:needs visit Scr:0.9 Age: 87 Weight:84.4  kg  Prescription refilled

## 2022-04-07 ENCOUNTER — Other Ambulatory Visit: Payer: Self-pay

## 2022-04-10 ENCOUNTER — Other Ambulatory Visit: Payer: Self-pay

## 2022-04-10 MED ORDER — ATORVASTATIN CALCIUM 80 MG PO TABS: ORAL_TABLET | ORAL | 0 refills | Status: DC

## 2022-04-17 DIAGNOSIS — M7989 Other specified soft tissue disorders: Secondary | ICD-10-CM | POA: Diagnosis not present

## 2022-04-17 DIAGNOSIS — R600 Localized edema: Secondary | ICD-10-CM | POA: Diagnosis not present

## 2022-04-18 NOTE — Progress Notes (Unsigned)
Cardiology Office Note   Date:  04/21/2022   ID:  ANTOINE [name], DOB 24-Jun-1935, MRN [id number]  PCP:  [name], MD (Inactive)    No chief complaint on file.  CAD  Wt Readings from Last 3 Encounters:  04/21/22 178 lb 12.8 oz (81.1 kg)  11/12/20 186 lb (84.4 kg)  01/26/20 191 lb (86.6 kg)       History of Present Illness: Shawn [name] is a 87 y.o. male  former patient of Dr. Tamala [name]  "with a hx of paroxysmal atrial fib, sinus bradycardia (HR 59 in 2015), CAD (s/p CABG in 1992, stenting to Cx 2004/2005, BMS-SVG-LAD 2008, MI 2011 with stents to the SVG to LAD, repeat CABG 2012, ischemic cardiomyopathy (EF 45-50% by cath 2015), hypertension, hyperlipidemia, and history of remote bladder cancer. Continues to smoke cigars."  Played pro baseball- minor leagues, [place], [place].  From the [place] originally.   In 2022: "Still tries to get exercise in both of the gym and with walking.  Does admit that his walking pace is significantly slowed compared to prior.  With physical activity he is not having angina or excessive dyspnea.  He denies orthopnea, PND, but does get left lower extremity edema after he is up and moving around in the mornings.  The left lower extremity edema is chronic.  He denies medication side effects. In chronic atrial flutter with good rate control.  Has now developed left bundle.  Decrease atenolol to 12.5 mg daily. Blood pressure is almost too low.  Decrease atenolol to 12.5 mg daily."  Still golfs twice a week at the gym.  Golfs when the weather has been warmer.   Reports chronic left ankle edema.  No problems with his heart rate or blood pressure since decrease in atenolol back in 2022.  Denies : Chest pain. Dizziness.  Nitroglycerin use. Orthopnea. Palpitations. Paroxysmal nocturnal dyspnea. Shortness of breath. Syncope.     Past Medical History:  Diagnosis Date   Anterior myocardial infarction ([place]) 09/2009   [name] 04/22/2010   Arthritis    "knees"  (11/29/2013)   Bladder cancer ([facility]) [id number]   Chronic systolic CHF (congestive heart failure) ([facility])    Coronary artery disease    a. s/p CABG in 1992. b. stenting to Cx 2004/2005. c. BMS-SVG-LAD 2008. d. MI 2011 s/p stents to SVG-LAD. e. redo CABGx2 in 2012. f. [place] 2015: med rx.   Essential hypertension    Hyperglycemia    Hyperlipidemia    Hypotension    a. ACEI stopped 09/2015 due to this.   Ischemic cardiomyopathy    a. EF 40-45% by [place] 2015.   PAF (paroxysmal atrial fibrillation) ([facility])    Sinus bradycardia    a. HR 50s on prior EKG.    Past Surgical History:  Procedure Laterality Date   APPENDECTOMY  1940's   CARDIAC CATHETERIZATION  09/2006   [name] 06/20/2010   CATARACT EXTRACTION W/ INTRAOCULAR LENS  IMPLANT, BILATERAL Bilateral 1990's   CORONARY ANGIOPLASTY WITH STENT PLACEMENT  "several since 1992"   [place]; ~2012   "CABG X3; CABH X 3"   INGUINAL HERNIA REPAIR N/A 01/26/2020   Procedure: LAPAROSCOPIC BILATERAL INGUINAL HERNIA REPAIR, BILATERAL TAP BLOCK;  Surgeon: [name], MD;  Location: [facility];  Service: General;  Laterality: N/A;   JOINT REPLACEMENT     LEFT HEART CATHETERIZATION WITH CORONARY/GRAFT ANGIOGRAM N/A 11/30/2013   Procedure: LEFT HEART CATHETERIZATION WITH Beatrix Fetters;  Surgeon: Troy Sine,  MD;  Location: [facility];  Service: Cardiovascular;  Laterality: N/A;   TONSILLECTOMY  1940's   TOTAL HIP ARTHROPLASTY Left 1992   TOTAL HIP ARTHROPLASTY Right ~ 1994     Current Outpatient Medications  Medication Sig Dispense Refill   apixaban (ELIQUIS) 5 MG TABS tablet Take 1 tablet Twice Daily.  NEEDS CARDIOLOGY APPOINTMENT, CALL OFFICE 60 tablet 0   atenolol (TENORMIN) 25 MG tablet Take 0.5 tablets (12.5 mg total) by mouth daily. 45 tablet 3   atorvastatin (LIPITOR) 80 MG tablet TAKE 1 TABLET(80 MG) BY MOUTH EVERY DAY AT 6 PM 15 tablet 0   colchicine 0.6 MG tablet Take 0.6 mg by mouth 2 (two) times daily as needed  (gout).      isosorbide mononitrate (IMDUR) 30 MG 24 hr tablet Take 1 tablet (30 mg total) by mouth daily. Please schedule overdue yearly appointment for future refills. 2nd attempt. Thank you 15 tablet 0   metFORMIN (GLUCOPHAGE-XR) 500 MG 24 hr tablet Take 500 mg by mouth daily.     nitroGLYCERIN (NITROSTAT) 0.4 MG SL tablet Place 1 tablet (0.4 mg total) under the tongue every 5 (five) minutes x 3 doses as needed for chest pain. 25 tablet 5   cholecalciferol (VITAMIN D3) 25 MCG (1000 UNIT) tablet Take 1,000 Units by mouth daily.     clindamycin (CLEOCIN) 150 MG capsule Take 150 mg by mouth daily.     No current facility-administered medications for this visit.    Allergies:   Penicillins    Social History:  The patient  reports that he has been smoking cigars and cigarettes. He started smoking about 49 years ago. He has never used smokeless tobacco. He reports that he does not drink alcohol and does not use drugs.   Family History:  The patient's family history includes Heart disease in his father.    ROS:  Please see the history of present illness.   Otherwise, review of systems are positive for left ankle edema-chronic.   All other systems are reviewed and negative.    PHYSICAL EXAM: VS:  BP 118/66   Pulse 62   Ht '6\' 3"'$  (1.905 m)   Wt 178 lb 12.8 oz (81.1 kg)   SpO2 99%   BMI 22.35 kg/m  , BMI Body mass index is 22.35 kg/m. GEN: Well nourished, well developed, in no acute distress HEENT: normal Neck: no JVD, carotid bruits, or masses Cardiac: RRR; no murmurs, rubs, or gallops,left ankle edema  Respiratory:  clear to auscultation bilaterally, normal work of breathing GI: soft, nontender, nondistended, + BS MS: no deformity or atrophy Skin: warm and dry, no rash Neuro:  Strength and sensation are intact Psych: euthymic mood, full affect   EKG:   The ekg ordered today demonstrates Atrial flutter, rate controlled.  LBBB   Recent Labs: No results found for requested labs  within last 365 days.   Lipid Panel    Component Value Date/Time   CHOL 98 (L) 08/09/2018 0917   TRIG 109 08/09/2018 0917   HDL 37 (L) 08/09/2018 0917   CHOLHDL 2.6 08/09/2018 0917   CHOLHDL 3.7 11/30/2013 0400   VLDL 18 11/30/2013 0400   LDLCALC 39 08/09/2018 0917     Other studies Reviewed: Additional studies/ records that were reviewed today with results demonstrating: December 2023 total cholesterol 126 HDL 41 LDL 69 triglycerides 81 A1c 7.0.   ASSESSMENT AND PLAN:  CAD: Continue aggressive secondary prevention.  No angina.  Continue aggressive secondary prevention.  PAF:  Has had chronic atrial flutter, rate controlled.  LBBB as of 2022.  Eliquis for stroke prevention.  No falls.  Heart rate unchanged on the lower dose of atenolol. HTN: Atenolol decreased to 12.5 mg daily in 2022.   Hyperlipidemia: Whole food, plant-based diet.  High-fiber diet.  Avoid processed foods. Chronic systolic heart failure: EF 40 to 45% at the time of cath in 2015. Chronic Anticoagulation: No bleeding issues.  No falls.  Stroke prevention in the setting of AFlutter.  DM: A1C 7.0.  On metformin.    Healthy diet.  Lives alone.  Son's lives in [place].    Current medicines are reviewed at length with the patient today.  The patient concerns regarding his medicines were addressed.  The following changes have been made:  No change  Labs/ tests ordered today include:  No orders of the defined types were placed in this encounter.   Recommend 150 minutes/week of aerobic exercise Low fat, low carb, high fiber diet recommended  Disposition:   FU in 1 year   Signed, Larae Grooms, MD  04/21/2022 8:47 AM    [facility] [address], [place], [place]  [address] Phone: [phone]; Fax: [phone]

## 2022-04-21 ENCOUNTER — Ambulatory Visit: Payer: Medicare Other | Attending: Interventional Cardiology | Admitting: Interventional Cardiology

## 2022-04-21 ENCOUNTER — Encounter: Payer: Self-pay | Admitting: Interventional Cardiology

## 2022-04-21 VITALS — BP 118/66 | HR 62 | Ht 75.0 in | Wt 178.8 lb

## 2022-04-21 DIAGNOSIS — E782 Mixed hyperlipidemia: Secondary | ICD-10-CM | POA: Diagnosis not present

## 2022-04-21 DIAGNOSIS — I447 Left bundle-branch block, unspecified: Secondary | ICD-10-CM | POA: Diagnosis not present

## 2022-04-21 DIAGNOSIS — Z7901 Long term (current) use of anticoagulants: Secondary | ICD-10-CM | POA: Insufficient documentation

## 2022-04-21 DIAGNOSIS — I10 Essential (primary) hypertension: Secondary | ICD-10-CM

## 2022-04-21 DIAGNOSIS — I5022 Chronic systolic (congestive) heart failure: Secondary | ICD-10-CM | POA: Insufficient documentation

## 2022-04-21 DIAGNOSIS — I25708 Atherosclerosis of coronary artery bypass graft(s), unspecified, with other forms of angina pectoris: Secondary | ICD-10-CM | POA: Diagnosis not present

## 2022-04-21 DIAGNOSIS — I480 Paroxysmal atrial fibrillation: Secondary | ICD-10-CM | POA: Diagnosis not present

## 2022-04-21 NOTE — Patient Instructions (Signed)

## 2022-04-22 ENCOUNTER — Other Ambulatory Visit: Payer: Self-pay | Admitting: Interventional Cardiology

## 2022-04-22 DIAGNOSIS — I480 Paroxysmal atrial fibrillation: Secondary | ICD-10-CM

## 2022-04-22 NOTE — Telephone Encounter (Addendum)
Prescription refill request for Eliquis received. Indication: Afib  Last office visit: 04/21/22 [place])  Scr:  0.96 (01/28/22 via [place])  Age: 87  Weight: 81.1kg  Appropriate dose. Refill sent.

## 2022-05-30 ENCOUNTER — Other Ambulatory Visit: Payer: Self-pay | Admitting: Physician Assistant

## 2022-07-30 DIAGNOSIS — I10 Essential (primary) hypertension: Secondary | ICD-10-CM | POA: Diagnosis not present

## 2022-07-30 DIAGNOSIS — M170 Bilateral primary osteoarthritis of knee: Secondary | ICD-10-CM | POA: Diagnosis not present

## 2022-07-30 DIAGNOSIS — D7589 Other specified diseases of blood and blood-forming organs: Secondary | ICD-10-CM | POA: Diagnosis not present

## 2022-07-30 DIAGNOSIS — E7800 Pure hypercholesterolemia, unspecified: Secondary | ICD-10-CM | POA: Diagnosis not present

## 2022-07-30 DIAGNOSIS — I480 Paroxysmal atrial fibrillation: Secondary | ICD-10-CM | POA: Diagnosis not present

## 2022-07-30 DIAGNOSIS — R946 Abnormal results of thyroid function studies: Secondary | ICD-10-CM | POA: Diagnosis not present

## 2022-07-30 DIAGNOSIS — E1159 Type 2 diabetes mellitus with other circulatory complications: Secondary | ICD-10-CM | POA: Diagnosis not present

## 2022-07-30 DIAGNOSIS — I25119 Atherosclerotic heart disease of native coronary artery with unspecified angina pectoris: Secondary | ICD-10-CM | POA: Diagnosis not present

## 2022-08-14 ENCOUNTER — Other Ambulatory Visit: Payer: Self-pay | Admitting: Physician Assistant

## 2022-12-16 DIAGNOSIS — J3089 Other allergic rhinitis: Secondary | ICD-10-CM | POA: Diagnosis not present

## 2022-12-16 DIAGNOSIS — K219 Gastro-esophageal reflux disease without esophagitis: Secondary | ICD-10-CM | POA: Diagnosis not present

## 2023-02-05 DIAGNOSIS — I25769 Atherosclerosis of bypass graft of coronary artery of transplanted heart with unspecified angina pectoris: Secondary | ICD-10-CM | POA: Diagnosis not present

## 2023-02-05 DIAGNOSIS — R1310 Dysphagia, unspecified: Secondary | ICD-10-CM | POA: Diagnosis not present

## 2023-02-05 DIAGNOSIS — E538 Deficiency of other specified B group vitamins: Secondary | ICD-10-CM | POA: Diagnosis not present

## 2023-02-05 DIAGNOSIS — Z0000 Encounter for general adult medical examination without abnormal findings: Secondary | ICD-10-CM | POA: Diagnosis not present

## 2023-02-05 DIAGNOSIS — Z23 Encounter for immunization: Secondary | ICD-10-CM | POA: Diagnosis not present

## 2023-02-05 DIAGNOSIS — E1159 Type 2 diabetes mellitus with other circulatory complications: Secondary | ICD-10-CM | POA: Diagnosis not present

## 2023-02-05 DIAGNOSIS — I10 Essential (primary) hypertension: Secondary | ICD-10-CM | POA: Diagnosis not present

## 2023-02-05 DIAGNOSIS — I480 Paroxysmal atrial fibrillation: Secondary | ICD-10-CM | POA: Diagnosis not present

## 2023-02-05 DIAGNOSIS — Z79899 Other long term (current) drug therapy: Secondary | ICD-10-CM | POA: Diagnosis not present

## 2023-02-27 ENCOUNTER — Other Ambulatory Visit: Payer: Self-pay | Admitting: Student

## 2023-02-27 DIAGNOSIS — R1310 Dysphagia, unspecified: Secondary | ICD-10-CM

## 2023-02-27 DIAGNOSIS — I4892 Unspecified atrial flutter: Secondary | ICD-10-CM | POA: Diagnosis not present

## 2023-02-27 DIAGNOSIS — K219 Gastro-esophageal reflux disease without esophagitis: Secondary | ICD-10-CM | POA: Diagnosis not present

## 2023-03-10 ENCOUNTER — Telehealth (HOSPITAL_COMMUNITY): Payer: Self-pay | Admitting: *Deleted

## 2023-03-10 NOTE — Telephone Encounter (Signed)
Attempted to contact patient to schedule OP MBS. Left VM. RKEEL

## 2023-03-13 ENCOUNTER — Other Ambulatory Visit: Payer: Self-pay

## 2023-03-13 DIAGNOSIS — I480 Paroxysmal atrial fibrillation: Secondary | ICD-10-CM

## 2023-03-13 MED ORDER — APIXABAN 5 MG PO TABS: 5.0000 mg | ORAL_TABLET | Freq: Two times a day (BID) | ORAL | 0 refills | Status: DC

## 2023-03-13 NOTE — Telephone Encounter (Signed)
Prescription refill request for Eliquis received. Indication:afib Last office visit:3/24 EPP:IRJJO labs Age: 88 Weight:81.1  kg  Prescription refilled

## 2023-03-18 ENCOUNTER — Other Ambulatory Visit (HOSPITAL_COMMUNITY): Payer: Self-pay | Admitting: Student

## 2023-03-18 DIAGNOSIS — R059 Cough, unspecified: Secondary | ICD-10-CM

## 2023-03-18 DIAGNOSIS — R1310 Dysphagia, unspecified: Secondary | ICD-10-CM

## 2023-03-19 ENCOUNTER — Ambulatory Visit
Admission: RE | Admit: 2023-03-19 | Discharge: 2023-03-19 | Disposition: A | Discharge status: Home / Self Care | Payer: Medicare Other | Source: Ambulatory Visit | Attending: Student | Admitting: Student

## 2023-03-19 DIAGNOSIS — R1310 Dysphagia, unspecified: Secondary | ICD-10-CM

## 2023-04-09 ENCOUNTER — Ambulatory Visit (HOSPITAL_COMMUNITY)
Admission: RE | Admit: 2023-04-09 | Discharge: 2023-04-09 | Disposition: A | Discharge status: Home / Self Care | Payer: Medicare Other | Source: Ambulatory Visit | Attending: Family Medicine | Admitting: Family Medicine

## 2023-04-09 DIAGNOSIS — R1310 Dysphagia, unspecified: Secondary | ICD-10-CM | POA: Diagnosis not present

## 2023-04-09 DIAGNOSIS — R1312 Dysphagia, oropharyngeal phase: Secondary | ICD-10-CM | POA: Diagnosis not present

## 2023-04-09 DIAGNOSIS — K219 Gastro-esophageal reflux disease without esophagitis: Secondary | ICD-10-CM | POA: Insufficient documentation

## 2023-04-09 DIAGNOSIS — I5022 Chronic systolic (congestive) heart failure: Secondary | ICD-10-CM | POA: Insufficient documentation

## 2023-04-09 DIAGNOSIS — R059 Cough, unspecified: Secondary | ICD-10-CM

## 2023-04-09 DIAGNOSIS — I4892 Unspecified atrial flutter: Secondary | ICD-10-CM | POA: Insufficient documentation

## 2023-04-09 DIAGNOSIS — R638 Other symptoms and signs concerning food and fluid intake: Secondary | ICD-10-CM | POA: Diagnosis not present

## 2023-04-09 NOTE — Progress Notes (Signed)
 Modified Barium Swallow Study  Patient Details  Name: Shawn [name] MRN: [id number] Date of Birth: Jan 30, 1936  Today's Date: 04/09/2023  Modified Barium Swallow completed.  Full report located under Chart Review in the Imaging Section.  History of Present Illness 87 yo M, PMH afib, CAD, HTN, hyperlididemia, and systolic heart failure. Endorses sx of dysphagia for past 8 months. Dysphagia sx include globus sensation and coughing with intake. Son says it seems like he is trying to cough up his food, denies emesis. Current diet of regular solids, thin liquids. Denies hx of PNA. Takes PPI. Presenting with usual throat clearing this date, prior to any PO intake. Hoarse voice which began around same time as swallow issues. Deneis hx of PNA. Additional concerns expressed regarding chronic nasal drainage from R nare.   Clinical Impression The pt presents with moderate oropharyngeal dysphagia. Pt accepted trials of thin, mildly thick, moderately thick liquids, puree, soft and regular solids, and pill with thin liquid. During the oral phase, the pt displayed weakened tongue control and posterior oral escape of liquids x2. Prolonged mastication and slowed lingual motion were also evident at the oral stage. There was some oral residue noted on the tongue/lips after the swallow.  During the pharyngeal phase, partial hyoid excursion and laryngeal elevation were observed with delayed/decreased inversion of the epiglottis and incomplete laryngeal vestibule closure. These deficits resulted in decreased airway protection which led to silent aspiration of thin liquids and trace penetration of mildly thick, puree and solid boluses. The pt demonstrates partial UES opening causing some obstruction of flow, with residue noted in pyiform sinuses after the swallow. Pt with slight reduction in tongue base retraction which contributed to dispersed pharyngeal residue which pt did not appear sensate to, as no volitional or  spontaneous efforts made to clear. When thin liquid aspiration observed, pt was able to eject with a strong throat clear and effortful swallow cued by the SLP.  Pt was able to eject mildly thin liquids when cued to clear and swallow. Trace amounts of penetrated material from puree and solid bolsues, noted to be high in laryngeal vestibule, were not ejected from the airway with cued cough & re-swallow. An intentional oral bolus hold before swallowing with thin liquids aided pt's coordination of airway protection for thin liquids and reduced aspiration risk. SLP recommended pt continues this practice when drinking, take small sips/bites, use an effortful swallow to clear and implement multiple hard swallows to clear excess residue. SLP also advised pt to complete oral care twice each day to improve oral hygiene and reduce risks associated with aspiration.  Pt would benefit from OP ST to address physiologic deficits to improve swallow safety and efficacy. Suggest consideration for ENT referral due to habitual throat clearing and consistently runny nose reported by the patient. Factors that may increase risk of adverse event in presence of aspiration Rubye Oaks & Clearance Coots 2021): Inadequate oral hygiene;Poor general health and/or compromised immunity  Swallow Evaluation Recommendations Recommendations: PO diet Liquid Administration via: Cup (hold in mouth to ensure control of bolus before initiating swallow) Medication Administration: Whole meds with liquid Supervision: Patient able to self-feed;Intermittent supervision/cueing for swallowing strategies Swallowing strategies  : Small bites/sips;effortful swallow;Multiple dry swallows after each bite/sip;Follow solids with liquids;Clear throat intermittently;Minimize environmental distractions Postural changes: Position pt fully upright for meals Oral care recommendations: Oral care BID (2x/day) Recommended consults: Consider ENT consultation      Maia Breslow 04/09/2023,1:30 PM

## 2023-04-27 NOTE — Progress Notes (Signed)
 No show  This encounter was created in error - please disregard.

## 2023-04-28 ENCOUNTER — Encounter: Payer: Medicare Other | Admitting: Cardiovascular Disease

## 2023-04-29 ENCOUNTER — Encounter: Payer: Self-pay | Admitting: Cardiovascular Disease

## 2023-06-09 ENCOUNTER — Other Ambulatory Visit: Payer: Self-pay | Admitting: Cardiovascular Disease

## 2023-06-09 DIAGNOSIS — I480 Paroxysmal atrial fibrillation: Secondary | ICD-10-CM

## 2023-06-09 NOTE — Telephone Encounter (Addendum)
 Eliquis  5mg  refill request received. Patient is 88 years old, weight-81.1kg, Crea-0.93 on 01/18/20-needs labs , Diagnosis-Afib, and last seen by Dr. Jacquelynn Matter on 04/21/22 & NO SHOW to 04/28/23 APPT WITH DR. Alroy Aspen. Dose is appropriate based on dosing criteria.   Pt needs labs & appt.  4/22 @923am  called pt and had to leave a message for him to call back regarding a refill request received.   Called PCP Dr. Orvilla Blander office to request labs, was transferred to Amy in medical records and had to leave a message regarding faxing labs.   Labs from PCP received Crea-0.98 on 02/05/23. Pt needs appt with Cardiologist.   4/22 @ 457pm pt has appt with Dr. Filiberto Hug on 4/29 at 10am. Will send in refill to requested pharmacy.

## 2023-06-16 ENCOUNTER — Encounter: Payer: Self-pay | Admitting: Cardiology

## 2023-06-16 ENCOUNTER — Ambulatory Visit: Attending: Cardiology | Admitting: Cardiology

## 2023-06-16 ENCOUNTER — Other Ambulatory Visit (HOSPITAL_COMMUNITY): Payer: Self-pay

## 2023-06-16 ENCOUNTER — Telehealth: Payer: Self-pay | Admitting: Interventional Cardiology

## 2023-06-16 VITALS — BP 120/60 | HR 73 | Resp 16 | Ht 75.0 in | Wt 181.0 lb

## 2023-06-16 DIAGNOSIS — I25708 Atherosclerosis of coronary artery bypass graft(s), unspecified, with other forms of angina pectoris: Secondary | ICD-10-CM | POA: Diagnosis not present

## 2023-06-16 DIAGNOSIS — I10 Essential (primary) hypertension: Secondary | ICD-10-CM | POA: Diagnosis not present

## 2023-06-16 DIAGNOSIS — R600 Localized edema: Secondary | ICD-10-CM | POA: Insufficient documentation

## 2023-06-16 DIAGNOSIS — E782 Mixed hyperlipidemia: Secondary | ICD-10-CM | POA: Diagnosis not present

## 2023-06-16 DIAGNOSIS — I480 Paroxysmal atrial fibrillation: Secondary | ICD-10-CM | POA: Diagnosis not present

## 2023-06-16 LAB — LIPID PANEL
Chol/HDL Ratio: 2.9 ratio (ref 0.0–5.0)
Cholesterol, Total: 117 mg/dL (ref 100–199)
HDL: 41 mg/dL (ref >39)
LDL Chol Calc (NIH): 61 mg/dL (ref 0–99)
Triglycerides: 74 mg/dL (ref 0–149)
VLDL Cholesterol Cal: 15 mg/dL (ref 5–40)

## 2023-06-16 MED ORDER — EZETIMIBE 10 MG PO TABS
10.0000 mg | ORAL_TABLET | Freq: Every day | ORAL | 3 refills | Status: DC
  Filled 2023-06-16: qty 90, 90d supply, fill #0

## 2023-06-16 MED ORDER — ATORVASTATIN CALCIUM 80 MG PO TABS: ORAL_TABLET | ORAL | 3 refills | Status: DC

## 2023-06-16 NOTE — Progress Notes (Signed)
 Cardiology Office Note:  .   Date:  06/16/2023  ID:  Shawn [name], DOB [birth year], MRN [id number] PCP: Vidal Graven, MD  [facility] Providers Cardiologist:  Fransico Ivy, MD PCP: Vidal Graven, MD  Chief Complaint  Patient presents with   Coronary Artery Disease   Follow-up    1 year     Shawn [name] is a 88 y.o. male with hypertension, hyperlipidemia, CAD s/p multiple prior PCI's and redo CABG in 2012, ischemic cardiomyopathy, PAF  Patient is here with his son.  He lives independently, plays golf occasionally, also does regular physical therapy.  He denies any complaints of chest pain, shortness of breath.  He has noticed leg swelling, left more than right leg.  Reviewed recent lab results with the patient, details below.  He is tolerating Eliquis  well without any bleeding issues.     Vitals:   06/16/23 1004  BP: 120/60  Pulse: 73  Resp: 16  SpO2: 90%      Review of Systems  Cardiovascular:  Positive for leg swelling. Negative for chest pain, dyspnea on exertion, palpitations and syncope.        Studies Reviewed: Shawn [name]   EKG Interpretation Date/Time:  Tuesday June 16 2023 10:06:26 EDT Ventricular Rate:  73 PR Interval:    QRS Duration:  134 QT Interval:  406 QTC Calculation: 447 R Axis:   -87  Text Interpretation: EKG 06/16/2023: Atrial flutter with variable A-V block Left axis deviation Non-specific intra-ventricular conduction block Cannot rule out Anteroseptal infarct (cited on or before 30-Nov-2013) When compared with ECG of 01-Dec-2013 06:10, Atrial flutter has replaced Sinus rhythm QRS duration has increased Nonspecific T wave abnormality, improved in Lateral leads Confirmed by Fransico Ivy [id number]) on 06/16/2023 10:08:55 AM    EKG 06/16/2023: Atrial fibrillation with controlled ventricular rate Left axis deviation Non-specific intra-ventricular conduction block Cannot rule out Anteroseptal infarct (cited on or before  30-Nov-2013) When compared with ECG of 01-Dec-2013 06:10, Atrial flutter has replaced Sinus rhythm QRS duration has increased Nonspecific T wave abnormality, improved in Lateral leads    Independently interpreted 01/2023: Chol 141, TG 81, HDL 41, LDL 84 HbA1C 6.8% Hb 14.7 Cr 0.93 TSH 2.3  Coronary angiography 2015: LM: Normal LAD: Proximal occlusion LCx: 70% proximal disease, moderate ISR mid vessel RCA: Mid 70-90% disease, followed by a complete occlusion RIMA-LAD: Patent SVG-OM: Patent Atretic LIMA, occluded SVG-LAD, occluded SVG-RCA.   Risk Assessment/Calculations:    CHA2DS2-VASc Score = 5  This indicates a 7.2% annual risk of stroke. The patient's score is based upon: CHF History: 1 HTN History: 1 Diabetes History: 0 Stroke History: 0 Vascular Disease History: 1 Age Score: 2 Gender Score: 0     Physical Exam Vitals and nursing note reviewed.  Constitutional:      General: He is not in acute distress. Neck:     Vascular: No JVD.  Cardiovascular:     Rate and Rhythm: Normal rate and regular rhythm.     Heart sounds: Normal heart sounds. No murmur heard. Pulmonary:     Effort: Pulmonary effort is normal.     Breath sounds: Normal breath sounds. No wheezing or rales.  Musculoskeletal:     Right lower leg: Edema (1+) present.     Left lower leg: Edema (Trace) present.  Skin:    Comments: Prominent varicosities seen in both legs      VISIT DIAGNOSES:   ICD-10-CM   1. Coronary artery disease of bypass graft of native heart  with stable angina pectoris (HCC)  I25.708 EKG 12-Lead    2. PAF (paroxysmal atrial fibrillation) ([facility])  I48.0     3. Essential hypertension  I10     4. Mixed hyperlipidemia  E78.2        Shawn [name] is a 88 y.o. male with hypertension, hyperlipidemia, CAD s/p multiple prior PCI's and redo CABG in 2012, ischemic cardiomyopathy, PAF  Assessment & Plan  CAD: Prior redo CABG, no anginal symptoms at this time. Not on  aspirin  due to ongoing use of Eliquis  for A-fib. LDL 84 on Lipitor  80 mg daily. Discussed diet and lifestyle modifications, as well as additional lipid-lowering therapy.  He prefers oral therapy for now.  Added Zetia 10 mg daily.  Repeat lipid panel in 3 months. Continue atenolol  12.5 mg daily, Imdur  30 mg daily.  Last use of sublingual nitroglycerin  was 2 years ago.  Paroxysmal A-fib: Rate controlled, asymptomatic.  Reasonable to continue rate control approach. Check echocardiogram. Continue Eliquis  5 mg twice daily.  Leg edema:  Left >right.  Prominent varicosities seen.  Will check echocardiogram, proBNP.  However, in absence of any other heart failure symptoms, I suspect leg edema could be due to venous insufficiency.  No suspicion for DVT given ongoing use of Eliquis . Recommend compression stockings, leg elevation at night. If no improvement, could consider referral to vascular surgery.    Meds ordered this encounter  Medications   ezetimibe (ZETIA) 10 MG tablet    Sig: Take 1 tablet (10 mg total) by mouth daily.    Dispense:  90 tablet    Refill:  3     F/u in 6 months  Signed, Cody Das, MD

## 2023-06-16 NOTE — Patient Instructions (Addendum)
 Medication Instructions:  START Zetia 10 mg take one tablet by mouth daily   *If you need a refill on your cardiac medications before your next appointment, please call your pharmacy*  Lab Work: Pro-bnp today  Lipid panel in 3 months   If you have labs (blood work) drawn today and your tests are completely normal, you will receive your results only by: MyChart Message (if you have MyChart) OR A paper copy in the mail If you have any lab test that is abnormal or we need to change your treatment, we will call you to review the results.  Testing/Procedures: Echo  Your physician has requested that you have an echocardiogram. Echocardiography is a painless test that uses sound waves to create images of your heart. It provides your doctor with information about the size and shape of your heart and how well your heart's chambers and valves are working. This procedure takes approximately one hour. There are no restrictions for this procedure. Please do NOT wear cologne, perfume, aftershave, or lotions (deodorant is allowed). Please arrive 15 minutes prior to your appointment time.  Please note: We ask at that you not bring children with you during ultrasound (echo/ vascular) testing. Due to room size and safety concerns, children are not allowed in the ultrasound rooms during exams. Our front office staff cannot provide observation of children in our lobby area while testing is being conducted. An adult accompanying a patient to their appointment will only be allowed in the ultrasound room at the discretion of the ultrasound technician under special circumstances. We apologize for any inconvenience.   Follow-Up: At [facility], you and your health needs are our priority.  As part of our continuing mission to provide you with exceptional heart care, our providers are all part of one team.  This team includes your primary Cardiologist (physician) and Advanced Practice Providers or APPs  (Physician Assistants and Nurse Practitioners) who all work together to provide you with the care you need, when you need it.  Your next appointment:   6 month(s)  Provider:   Cody Das, MD

## 2023-06-16 NOTE — Addendum Note (Signed)
 Addended by: Miles Allan B on: 06/16/2023 10:37 AM   Modules accepted: Orders

## 2023-06-16 NOTE — Telephone Encounter (Signed)
*  STAT* If patient is at the pharmacy, call can be transferred to refill team.   1. Which medications need to be refilled? (please list name of each medication and dose if known) atorvastatin (LIPITOR) 80 MG tablet   2. Which pharmacy/location (including street and city if local pharmacy) is medication to be sent to? [id number] - [place],  - [address] AT [address]   3. Do they need a 30 day or 90 day supply? 90

## 2023-06-17 ENCOUNTER — Encounter: Payer: Self-pay | Admitting: Cardiology

## 2023-07-16 DIAGNOSIS — H353131 Nonexudative age-related macular degeneration, bilateral, early dry stage: Secondary | ICD-10-CM | POA: Diagnosis not present

## 2023-07-16 DIAGNOSIS — H1789 Other corneal scars and opacities: Secondary | ICD-10-CM | POA: Diagnosis not present

## 2023-07-16 DIAGNOSIS — H18593 Other hereditary corneal dystrophies, bilateral: Secondary | ICD-10-CM | POA: Diagnosis not present

## 2023-07-16 DIAGNOSIS — H31002 Unspecified chorioretinal scars, left eye: Secondary | ICD-10-CM | POA: Diagnosis not present

## 2023-07-16 DIAGNOSIS — H5203 Hypermetropia, bilateral: Secondary | ICD-10-CM | POA: Diagnosis not present

## 2023-07-16 DIAGNOSIS — E119 Type 2 diabetes mellitus without complications: Secondary | ICD-10-CM | POA: Diagnosis not present

## 2023-07-17 ENCOUNTER — Ambulatory Visit: Payer: Self-pay | Admitting: Cardiology

## 2023-07-17 ENCOUNTER — Ambulatory Visit (HOSPITAL_COMMUNITY)
Admission: RE | Admit: 2023-07-17 | Discharge: 2023-07-17 | Disposition: A | Discharge status: Home / Self Care | Source: Ambulatory Visit | Attending: Cardiology | Admitting: Cardiology

## 2023-07-17 DIAGNOSIS — R600 Localized edema: Secondary | ICD-10-CM

## 2023-07-17 LAB — ECHOCARDIOGRAM COMPLETE
Area-P 1/2: 5.56 cm2
P 1/2 time: 369 ms
S' Lateral: 3.6 cm

## 2023-07-17 NOTE — Progress Notes (Signed)
 Unable to leave message, will try again at later time.

## 2023-07-17 NOTE — Progress Notes (Signed)
 Heart function is moderately reduced on this echocardiogram.  Recommend follow-up to discuss implications and treatment options.  Thanks MJP

## 2023-07-22 NOTE — Telephone Encounter (Signed)
 Pt returning call, requesting cb

## 2023-07-30 ENCOUNTER — Telehealth: Payer: Self-pay | Admitting: Cardiology

## 2023-07-30 NOTE — Telephone Encounter (Signed)
 [facility]. We will need to add a diagnosis. What is the reason?  Thanks MJP

## 2023-07-30 NOTE — Telephone Encounter (Signed)
 Left message to call back

## 2023-07-30 NOTE — Telephone Encounter (Signed)
 Patient's son was calling because he would like recommendations for a podiatry. Please advise.

## 2023-07-31 NOTE — Telephone Encounter (Signed)
 Spoke with pt's son, Alvie Jolly, [place] who states pt has not been taking care of his feet and toe nails.  Advised Dr Filiberto Hug recommends [facility].  RN offered referral but son states he will contact podiatry and if referral is needed he will contact office again.  He thanked Charity fundraiser for the callback.

## 2023-07-31 NOTE — Telephone Encounter (Signed)
Pt son returning call

## 2023-08-06 ENCOUNTER — Encounter: Payer: Self-pay | Admitting: Podiatry

## 2023-08-06 ENCOUNTER — Ambulatory Visit (INDEPENDENT_AMBULATORY_CARE_PROVIDER_SITE_OTHER): Payer: Self-pay | Admitting: Podiatry

## 2023-08-06 ENCOUNTER — Ambulatory Visit (INDEPENDENT_AMBULATORY_CARE_PROVIDER_SITE_OTHER)

## 2023-08-06 DIAGNOSIS — M21619 Bunion of unspecified foot: Secondary | ICD-10-CM | POA: Diagnosis not present

## 2023-08-06 DIAGNOSIS — B351 Tinea unguium: Secondary | ICD-10-CM | POA: Diagnosis not present

## 2023-08-06 DIAGNOSIS — M79675 Pain in left toe(s): Secondary | ICD-10-CM

## 2023-08-06 DIAGNOSIS — M79674 Pain in right toe(s): Secondary | ICD-10-CM

## 2023-08-06 DIAGNOSIS — M2042 Other hammer toe(s) (acquired), left foot: Secondary | ICD-10-CM | POA: Diagnosis not present

## 2023-08-06 DIAGNOSIS — M7989 Other specified soft tissue disorders: Secondary | ICD-10-CM

## 2023-08-06 DIAGNOSIS — M2041 Other hammer toe(s) (acquired), right foot: Secondary | ICD-10-CM

## 2023-08-06 NOTE — Progress Notes (Signed)
 Subjective:   Patient ID: Shawn [name], male   DOB: 88 y.o.   MRN: [id number]   HPI Patient presents with caregiver with concerns about bilateral pain in the feet and swelling and also severe nail disease.  Patient does not currently smoke and tries to be active for his age   Review of Systems  All other systems reviewed and are negative.       Objective:  Physical Exam Vitals and nursing note reviewed.  Constitutional:      Appearance: He is well-developed.  Pulmonary:     Effort: Pulmonary effort is normal.   Musculoskeletal:        General: Normal range of motion.   Skin:    General: Skin is warm.   Neurological:     Mental Status: He is alert.     Neurovascular status intact mild edema which appears to be more venous negative when it comes to pitting.  Negative Celine Collard' sign was noted with patient having history of congestive heart failure.  Patient has elongated nailbeds 1-5 both feet that are thickened and moderately discomforting and patient does have mild generalized foot pain.  Good digital perfusion well-oriented x 3     Assessment:  Mycotic nail infection 1-5 both feet with pain along with moderate tendinitis and probability for venous disease of a mild nature     Plan:  H&P reviewed all 3 conditions with patient and I advised on elevation compression for venous disease and no other treatments due to the advanced age and that they are not significant.  I debrided painful nailbeds 1-5 both feet iatrogenic bleeding can reappoint for this as needed and also can do it

## 2023-08-13 ENCOUNTER — Telehealth: Payer: Self-pay | Admitting: Cardiology

## 2023-08-13 ENCOUNTER — Ambulatory Visit: Attending: Cardiology | Admitting: Cardiology

## 2023-08-13 ENCOUNTER — Encounter: Payer: Self-pay | Admitting: Cardiology

## 2023-08-13 ENCOUNTER — Encounter: Payer: Self-pay | Admitting: *Deleted

## 2023-08-13 ENCOUNTER — Telehealth: Payer: Self-pay | Admitting: *Deleted

## 2023-08-13 VITALS — BP 158/81 | HR 105 | Ht 77.0 in | Wt 173.0 lb

## 2023-08-13 DIAGNOSIS — Z79899 Other long term (current) drug therapy: Secondary | ICD-10-CM | POA: Diagnosis not present

## 2023-08-13 DIAGNOSIS — I5020 Unspecified systolic (congestive) heart failure: Secondary | ICD-10-CM | POA: Diagnosis not present

## 2023-08-13 DIAGNOSIS — I5022 Chronic systolic (congestive) heart failure: Secondary | ICD-10-CM | POA: Diagnosis not present

## 2023-08-13 DIAGNOSIS — I25810 Atherosclerosis of coronary artery bypass graft(s) without angina pectoris: Secondary | ICD-10-CM | POA: Insufficient documentation

## 2023-08-13 DIAGNOSIS — Z01812 Encounter for preprocedural laboratory examination: Secondary | ICD-10-CM | POA: Diagnosis not present

## 2023-08-13 DIAGNOSIS — I4811 Longstanding persistent atrial fibrillation: Secondary | ICD-10-CM | POA: Diagnosis not present

## 2023-08-13 MED ORDER — EMPAGLIFLOZIN 10 MG PO TABS: 10.0000 mg | ORAL_TABLET | Freq: Every day | ORAL | 0 refills | Status: DC

## 2023-08-13 MED ORDER — METOPROLOL SUCCINATE ER 25 MG PO TB24: 25.0000 mg | ORAL_TABLET | Freq: Every day | ORAL | 2 refills | Status: DC

## 2023-08-13 MED ORDER — ENTRESTO 24-26 MG PO TABS: 1.0000 | ORAL_TABLET | Freq: Two times a day (BID) | ORAL | 2 refills | Status: DC

## 2023-08-13 MED ORDER — EMPAGLIFLOZIN 10 MG PO TABS: 10.0000 mg | ORAL_TABLET | Freq: Every day | ORAL | 6 refills | Status: DC

## 2023-08-13 NOTE — Telephone Encounter (Signed)
 Medication name/dosage: Samples List: Entresto 24/26 mg  Administration instructions:   Reason for samples: Reason for samples: new start  Ordering provider:  *Once above information entered, route the phone encounter to CV DIV MAG ST SAMPLES and send Teams message to team member assigned to Samples for the day.    Medication name/dosage: Samples List: Jardiance 10 mg  Administration instructions:   Reason for samples: Reason for samples: new start  Ordering provider:  *Once above information entered, route the phone encounter to CV DIV MAG ST SAMPLES and send Teams message to team member assigned to Samples for the day.

## 2023-08-13 NOTE — Progress Notes (Signed)
 Cardiology Office Note:  .   Date:  08/13/2023  ID:  Shawn [name], DOB 01/02/1936, MRN [id number] PCP: Gib Charleston, MD  [facility] Providers Cardiologist:  Newman Lawrence, MD PCP: Gib Charleston, MD  Chief Complaint  Patient presents with   Cardiomyopathy     Shawn [name] is a 88 y.o. male with hypertension, hyperlipidemia, CAD s/p multiple prior PCI's and redo CABG in 2012, ischemic cardiomyopathy, PAF  Patient is here with his son today.  He denies any overt symptoms of dyspnea, but is limited with his physical activity at his age.  He does have mild leg swelling.  Blood pressure is elevated today.  He has been atrial flutter/fibrillation for a long time, but has never had cardioversion.  Recent echocardiogram shows only mild biatrial dilatation, shows EF of 35 to 40% with wall motion abnormalities consistent with ischemic cardiomyopathy    Vitals:   08/13/23 1153  BP: (!) 158/81  Pulse: (!) 105  SpO2: 93%       Review of Systems  Cardiovascular:  Positive for leg swelling. Negative for chest pain, dyspnea on exertion, palpitations and syncope.        Studies Reviewed: Shawn [name]        EKG 08/13/2023: Atrial flutter with variable AV conduction Left axis deviation Non-specific intra-ventricular conduction block Minimal voltage criteria for LVH, may be normal variant ( Cornell product ) Cannot rule out Septal infarct (cited on or before 30-Nov-2013) When compared with ECG of 16-Jun-2023 10:06, No significant change was found     EKG 06/16/2023: Atrial fibrillation with controlled ventricular rate Left axis deviation Non-specific intra-ventricular conduction block Cannot rule out Anteroseptal infarct (cited on or before 30-Nov-2013) When compared with ECG of 01-Dec-2013 06:10, Atrial flutter has replaced Sinus rhythm QRS duration has increased Nonspecific T wave abnormality, improved in Lateral leads    Labs 05/2023: Chol 117, TG 74, HDL 41,  LDL 61  01/2023: Chol 141, TG 81, HDL 41, LDL 84 HbA1C 6.8% Hb 14.7 Cr 0.93 TSH 2.3  Echocardiogram 06/2023:  1. Left ventricular ejection fraction, by estimation, is 35 to 40%. The  left ventricle has moderately decreased function. The left ventricle  demonstrates regional wall motion abnormalities. The entire septum  and entire inferior wall are hypokinetic. The entire anterior wall, entire  lateral wall, and apex are normal.  There is mild asymmetric left ventricular hypertrophy of the basal-septal segment. Left ventricular diastolic parameters  are indeterminate.   2. Right ventricular systolic function is mildly reduced. The right  ventricular size is normal. Tricuspid regurgitation signal is inadequate  for assessing PA pressure.   3. Left atrial size was mildly dilated.   4. Right atrial size was mildly dilated.   5. The mitral valve is normal in structure. Trivial mitral valve  regurgitation. No evidence of mitral stenosis.   6. The aortic valve is tricuspid. Aortic valve regurgitation is mild.  Aortic valve sclerosis/calcification is present, without any evidence of  aortic stenosis.   7. Aortic dilatation noted. There is dilatation of the aortic root,  measuring 41 mm.   8. The inferior vena cava is normal in size with greater than 50%  respiratory variability, suggesting right atrial pressure of 3 mmHg.   Coronary angiography 2015: LM: Normal LAD: Proximal occlusion LCx: 70% proximal disease, moderate ISR mid vessel RCA: Mid 70-90% disease, followed by a complete occlusion RIMA-LAD: Patent SVG-OM: Patent Atretic LIMA, occluded SVG-LAD, occluded SVG-RCA.   Risk Assessment/Calculations:  CHA2DS2-VASc Score = 5  This indicates a 7.2% annual risk of stroke. The patient's score is based upon: CHF History: 1 HTN History: 1 Diabetes History: 0 Stroke History: 0 Vascular Disease History: 1 Age Score: 2 Gender Score: 0     Physical Exam Vitals and nursing  note reviewed.  Constitutional:      General: He is not in acute distress. Neck:     Vascular: No JVD.   Cardiovascular:     Rate and Rhythm: Normal rate. Rhythm irregular.     Heart sounds: Normal heart sounds. No murmur heard. Pulmonary:     Effort: Pulmonary effort is normal.     Breath sounds: Normal breath sounds. No wheezing or rales.   Musculoskeletal:     Right lower leg: Edema (1+) present.     Left lower leg: Edema (Trace) present.   Skin:    Comments: Prominent varicosities seen in both legs      VISIT DIAGNOSES:   ICD-10-CM   1. Coronary artery disease involving coronary bypass graft of native heart without angina pectoris  I25.810 EKG 12-Lead    metoprolol  succinate (TOPROL  XL) 25 MG 24 hr tablet    sacubitril-valsartan (ENTRESTO) 24-26 MG    empagliflozin (JARDIANCE) 10 MG TABS tablet    CBC    Basic metabolic panel with GFR    Pro b natriuretic peptide (BNP)    AMB Referral to [facility] Pharm[facility]    ECHOCARDIOGRAM COMPLETE    Pro b natriuretic peptide (BNP)    Basic metabolic panel with GFR    CBC    2. HFrEF (heart failure with reduced ejection fraction) (HCC)  I50.20 EKG 12-Lead    metoprolol  succinate (TOPROL  XL) 25 MG 24 hr tablet    sacubitril-valsartan (ENTRESTO) 24-26 MG    empagliflozin (JARDIANCE) 10 MG TABS tablet    CBC    Basic metabolic panel with GFR    Pro b natriuretic peptide (BNP)    AMB Referral to [facility] Pharm[facility]    ECHOCARDIOGRAM COMPLETE    Pro b natriuretic peptide (BNP)    Basic metabolic panel with GFR    CBC    3. PAF (paroxysmal atrial fibrillation) (HCC)  I48.0 metoprolol  succinate (TOPROL  XL) 25 MG 24 hr tablet    sacubitril-valsartan (ENTRESTO) 24-26 MG    empagliflozin (JARDIANCE) 10 MG TABS tablet    CBC    Basic metabolic panel with GFR    Pro b natriuretic peptide (BNP)    AMB Referral to [facility] Pharm[facility]    ECHOCARDIOGRAM COMPLETE    Pro b natriuretic peptide (BNP)    Basic metabolic panel with GFR    CBC     4. Chronic systolic CHF (congestive heart failure) (HCC)  I50.22 metoprolol  succinate (TOPROL  XL) 25 MG 24 hr tablet    sacubitril-valsartan (ENTRESTO) 24-26 MG    empagliflozin (JARDIANCE) 10 MG TABS tablet    CBC    Basic metabolic panel with GFR    Pro b natriuretic peptide (BNP)    AMB Referral to [facility]    ECHOCARDIOGRAM COMPLETE    Pro b natriuretic peptide (BNP)    Basic metabolic panel with GFR    CBC    5. Medication management  Z79.899 metoprolol  succinate (TOPROL  XL) 25 MG 24 hr tablet    sacubitril-valsartan (ENTRESTO) 24-26 MG    empagliflozin (JARDIANCE) 10 MG TABS tablet    CBC    Basic metabolic panel with GFR    Pro b natriuretic  peptide (BNP)    AMB Referral to [facility] Pharm-D    Pro b natriuretic peptide (BNP)    Basic metabolic panel with GFR    CBC    6. Pre-procedure lab exam  Z01.812 metoprolol  succinate (TOPROL  XL) 25 MG 24 hr tablet    sacubitril-valsartan (ENTRESTO) 24-26 MG    empagliflozin (JARDIANCE) 10 MG TABS tablet    CBC    Basic metabolic panel with GFR    Pro b natriuretic peptide (BNP)    AMB Referral to [facility] Pharm[facility]    Pro b natriuretic peptide (BNP)    Basic metabolic panel with GFR    CBC       Shawn [name] is a 88 y.o. male with hypertension, hyperlipidemia, CAD s/p multiple prior PCI's and redo CABG in 2012, ischemic cardiomyopathy, PAF  Assessment & Plan  Cardiomyopathy: EF 35-40%.  Likely ischemic cardiomyopathy with prior history of CABG, although no recent anginal symptoms. I suspect low EF could also be partly due to his atrial flutter/fibrillation.  He has never had cardioversion, there is only biatrial dilatation on echocardiogram.  He is compliant on Eliquis .  Recommend cardioversion.  It is quite possible that he may go back into flutter or fibrillation given the chronicity of his arrhythmia, but I would not recommend rhythm control therapy given his age and relative lack of symptoms. Recommend  GDMT for HFrEF. Stop atenolol  12.5 mg daily, and Imdur  30 mg daily. Instead, started metoprolol  succinate 25 mg daily, Entresto 24-26 mg twice daily, Jardiance 10 mg daily. Check BMP, proBNP, and CBC in 1 week. Plan for cardioversion in 2 weeks, followed by Pharm.D. appointment for uptitration of GDMT as tolerated in 4 weeks. Repeat echocardiogram 10/2023.  CAD: Prior redo CABG, no anginal symptoms at this time. Not on aspirin  due to ongoing use of Eliquis  for A-fib. LDL 41 on Lipitor  80 mg daily and Zetia  10 mg daily. Continue the same.  Persistent A-fib/flutter: Continue Eliquis  5 mg twice daily. Plan for cardioversion.   Meds ordered this encounter  Medications   metoprolol  succinate (TOPROL  XL) 25 MG 24 hr tablet    Sig: Take 1 tablet (25 mg total) by mouth daily.    Dispense:  90 tablet    Refill:  2   sacubitril-valsartan (ENTRESTO) 24-26 MG    Sig: Take 1 tablet by mouth 2 (two) times daily.    Dispense:  60 tablet    Refill:  2    Please Honor Card patient is presenting for Shawn [name]: [id number]; Shawn [name]: [id number]; RXPCN: [id number]; RXID: [id number]   empagliflozin (JARDIANCE) 10 MG TABS tablet    Sig: Take 1 tablet (10 mg total) by mouth daily before breakfast.    Dispense:  30 tablet    Refill:  6     F/u in 6 months  Signed, Newman JINNY Lawrence, MD

## 2023-08-13 NOTE — Addendum Note (Signed)
 Addended by: GRETEL MAEOLA CROME on: 08/13/2023 02:25 PM   Modules accepted: Orders

## 2023-08-13 NOTE — Telephone Encounter (Signed)
 Patient signed ROI-Faxed to [facility] HIM-Medical records 08/13/2023.

## 2023-08-13 NOTE — Patient Instructions (Signed)
 Medication Instructions:   STOP TAKING ATENOLOL  NOW  START TAKING METOPROLOL  SUCCINATE (TOPROL  XL) 25 MG BY MOUTH DAILY  START TAKING ENTRESTO 24/26 MG DOSE--TAKE ONE TABLET BY MOUTH TWICE DAILY  START TAKING JARDIANCE 10 MG BY MOUTH DAILY BEFORE BREAKFAST   *If you need a refill on your cardiac medications before your next appointment, please call your pharmacy*   You have been referred to OUR PHARMACIST HERE IN THE OFFICE TO BE SEEN IN 4 WEEKS FOR HEART FAILURE MEDICATION MANAGEMENT    Lab Work:  IN ONE WEEK HERE IN THIS OFFICE--FIRST FLOOR AT [facility]--PRO-BNP, CBC, AND BMET  If you have labs (blood work) drawn today and your tests are completely normal, you will receive your results only by: MyChart Message (if you have MyChart) OR A paper copy in the mail If you have any lab test that is abnormal or we need to change your treatment, we will call you to review the results.   Testing/Procedures:  Your physician has requested that you have an echocardiogram. Echocardiography is a painless test that uses sound waves to create images of your heart. It provides your doctor with information about the size and shape of your heart and how well your heart's chambers and valves are working. This procedure takes approximately one hour. There are no restrictions for this procedure.  PLEASE SCHEDULE ECHO TO BE DONE IN AUGUST 2025 PER DR. PATWARDHAN  Please do NOT wear cologne, perfume, aftershave, or lotions (deodorant is allowed). Please arrive 15 minutes prior to your appointment time.  Please note: We ask at that you not bring children with you during ultrasound (echo/ vascular) testing. Due to room size and safety concerns, children are not allowed in the ultrasound rooms during exams. Our front office staff cannot provide observation of children in our lobby area while testing is being conducted. An adult accompanying a patient to their appointment will only be allowed in the ultrasound  room at the discretion of the ultrasound technician under special circumstances. We apologize for any inconvenience.    Your physician has recommended that you have a Cardioversion (DCCV). Electrical Cardioversion uses a jolt of electricity to your heart either through paddles or wired patches attached to your chest. This is a controlled, usually prescheduled, procedure. Defibrillation is done under light anesthesia in the hospital, and you usually go home the day of the procedure. This is done to get your heart back into a normal rhythm. You are not awake for the procedure. Please see the instruction sheet below  SCHEDULE CARDIOVERSION TO BE DONE IN 2 WEEKS FOR AFIB    Follow-Up:  IN AUGUST 2025 WITH DR. PATWARDHAN--PLEASE HAVE ECHO DONE BEFORE THIS VISIT   Other Instructions         Dear Shawn [name]  You are scheduled for a Cardioversion on Friday, July 11 with Dr. Okey.  Please arrive at the [facility] (Main Entrance A) at [facility]: [address] [place], [place] [address] at 7:00 AM (This time is 1 hour(s) before your procedure to ensure your preparation).   Free valet parking service is available. You will check in at ADMITTING.   *Please Note: You will receive a call the day before your procedure to confirm the appointment time. That time may have changed from the original time based on the schedule for that day.*    DIET:  Nothing to eat or drink after midnight except a sip of water  with medications (see medication instructions below)  MEDICATION INSTRUCTIONS: !!IF ANY NEW MEDICATIONS ARE STARTED AFTER TODAY, PLEASE NOTIFY YOUR PROVIDER AS SOON AS POSSIBLE!!  FYI: Medications such as Semaglutide (Ozempic, [place]), Tirzepatide (Mounjaro, Zepbound), Dulaglutide (Trulicity), etc (GLP1 agonists) AND Canagliflozin (Invokana), Dapagliflozin (Farxiga), Empagliflozin (Jardiance), Ertugliflozin (Steglatro), Bexagliflozin [employer]) or any combination with one of these  drugs such as Invokamet (Canagliflozin/Metformin), Synjardy (Empagliflozin/Metformin), etc (SGLT2 inhibitors) must be held around the time of a procedure. This is not a comprehensive list of all of these drugs. Please review all of your medications and talk to your provider if you take any one of these. If you are not sure, ask your provider.   HOLD: Empagliflozin (Jardiance) for 3 days prior to the procedure. Last dose on Monday, July 07.  Hold your morning medications the morning of this procedure -- ONLY TAKE YOUR ELIQUIS  Continue taking your anticoagulant (blood thinner): Apixaban  (Eliquis ).  You will need to continue this after your procedure until you are told by your provider that it is safe to stop.    LABS:   Come to the lab at the [facility] ([address], [place], 1st Floor) between the hours of 8:00 am and 4:30 pm. You do NOT have to be fasting.  FYI:  For your safety, and to allow us  to monitor your vital signs accurately during the surgery/procedure we request: If you have artificial nails, gel coating, SNS etc, please have those removed prior to your surgery/procedure. Not having the nail coverings /polish removed may result in cancellation or delay of your surgery/procedure.  Your support person will be asked to wait in the waiting room during your procedure.  It is OK to have someone drop you off and come back when you are ready to be discharged.  You cannot drive after the procedure and will need someone to drive you home.  Bring your insurance cards.  *Special Note: Every effort is made to have your procedure done on time. Occasionally there are emergencies that occur at the hospital that may cause delays. Please be patient if a delay does occur.

## 2023-08-13 NOTE — Telephone Encounter (Signed)
 Shawn [name]

## 2023-08-14 ENCOUNTER — Telehealth: Payer: Self-pay | Admitting: Cardiology

## 2023-08-14 NOTE — Telephone Encounter (Signed)
 Spoke with Kerri per DPR. Pt was with her at this time. We went through his medications and which ones were stopped yesterday. Kirke wanted to know if he does not have cardioversion would he still take the medications and I told her he would. I explained the cardioversion to her. Kirke stated that she did want to keep the cardioversion scheduled for now and would speak with her family.

## 2023-08-14 NOTE — Telephone Encounter (Signed)
 Pt c/o medication issue:  1. Name of Medication:   isosorbide  mononitrate (IMDUR ) 30 MG 24 hr tablet  atorvastatin  (LIPITOR ) 80 MG tablet   2. How are you currently taking this medication (dosage and times per day)?   3. Are you having a reaction (difficulty breathing--STAT)?   4. What is your medication issue?   Daughter Delories) wants a call back to discuss patient's medication changes prior to his procedure.    Daughter also has a question regarding if patient does not want to do the procedure will he still be taking these medications.

## 2023-08-17 NOTE — Telephone Encounter (Signed)
 Spoke with pt's daughter Shawn [name] who is asking what medication pt must take uninterrupted.  Advised pt must take Eliquis  without missing any doses prior to DCCV.  Pt's daughter states she is still not certain pt wants to follow through with procedure but will let us  know if procedure needs to be canceled.  Pt's daughter thanked Charity fundraiser for the callback.

## 2023-08-17 NOTE — Telephone Encounter (Signed)
Calling to speak with the nurse. Please advise ?

## 2023-08-20 DIAGNOSIS — Z79899 Other long term (current) drug therapy: Secondary | ICD-10-CM | POA: Diagnosis not present

## 2023-08-20 DIAGNOSIS — I5020 Unspecified systolic (congestive) heart failure: Secondary | ICD-10-CM | POA: Diagnosis not present

## 2023-08-20 DIAGNOSIS — I25810 Atherosclerosis of coronary artery bypass graft(s) without angina pectoris: Secondary | ICD-10-CM | POA: Diagnosis not present

## 2023-08-20 DIAGNOSIS — Z01812 Encounter for preprocedural laboratory examination: Secondary | ICD-10-CM | POA: Diagnosis not present

## 2023-08-20 DIAGNOSIS — I5022 Chronic systolic (congestive) heart failure: Secondary | ICD-10-CM | POA: Diagnosis not present

## 2023-08-20 DIAGNOSIS — I480 Paroxysmal atrial fibrillation: Secondary | ICD-10-CM | POA: Diagnosis not present

## 2023-08-21 ENCOUNTER — Ambulatory Visit: Payer: Self-pay | Admitting: Cardiology

## 2023-08-21 LAB — BASIC METABOLIC PANEL WITH GFR
BUN/Creatinine Ratio: 12 (ref 10–24)
BUN: 12 mg/dL (ref 8–27)
CO2: 21 mmol/L (ref 20–29)
Calcium: 9.4 mg/dL (ref 8.6–10.2)
Chloride: 102 mmol/L (ref 96–106)
Creatinine, Ser: 1.03 mg/dL (ref 0.76–1.27)
Glucose: 173 mg/dL — ABNORMAL HIGH (ref 70–99)
Potassium: 4.4 mmol/L (ref 3.5–5.2)
Sodium: 142 mmol/L (ref 134–144)
eGFR: 71 mL/min/1.73 (ref >59)

## 2023-08-21 LAB — CBC
Hematocrit: 50 % (ref 37.5–51.0)
Hemoglobin: 16.1 g/dL (ref 13.0–17.7)
MCH: 32.1 pg (ref 26.6–33.0)
MCHC: 32.2 g/dL (ref 31.5–35.7)
MCV: 100 fL — ABNORMAL HIGH (ref 79–97)
Platelets: 263 x10E3/uL (ref 150–450)
RBC: 5.02 x10E6/uL (ref 4.14–5.80)
RDW: 12.3 % (ref 11.6–15.4)
WBC: 7.6 x10E3/uL (ref 3.4–10.8)

## 2023-08-21 LAB — PRO B NATRIURETIC PEPTIDE: NT-Pro BNP: 716 pg/mL — ABNORMAL HIGH (ref 0–486)

## 2023-09-04 ENCOUNTER — Telehealth: Payer: Self-pay | Admitting: Cardiology

## 2023-09-04 NOTE — Telephone Encounter (Signed)
 Pt c/o medication issue:  1. Name of Medication:   apixaban  (ELIQUIS ) 5 MG TABS tablet    2. How are you currently taking this medication (dosage and times per day)?    3. Are you having a reaction (difficulty breathing--STAT)? no  4. What is your medication issue? Daughter is calling to speak with the nurse because the patient had missed two days of medication. Please advise

## 2023-09-04 NOTE — Telephone Encounter (Signed)
 Left message for patient to call back

## 2023-09-07 ENCOUNTER — Telehealth: Payer: Self-pay | Admitting: Cardiology

## 2023-09-07 NOTE — Telephone Encounter (Signed)
 Son would like to reschedule 7/23 procedure with Dr. Jeffrie.

## 2023-09-07 NOTE — Telephone Encounter (Signed)
 Called pt son [name] ok per DPR.  Reports pt missed a couple of days of Eliquis  would like to reschedule DCCV.  Missed last Sunday and Monday restarted medication as ordered on Tuesday July 15.    Advised will send to MD to advise when DCCV should be rescheduled or if pt should receive a TEE/DCCV.

## 2023-09-07 NOTE — Telephone Encounter (Signed)
 Agree with rescheduling. Please ensure strict compliance with Eliquis  at least 4 weeks before and after cardioversion.  Thanks MJP

## 2023-09-07 NOTE — Telephone Encounter (Signed)
 Left a message to call back.  Advised MD would like to cancel DCCV.  Pt will need to take Eliquis  with no missed does 4 weeks before and 4 weeks after. Advised if pt doesn't miss anymore doses of Eliquis  can schedule as early as 09/29/23.   Asked that son call in to let us  know what day works for DCCV.   Will route to primary RN to f/u.

## 2023-09-08 NOTE — Telephone Encounter (Signed)
 Left message to call back

## 2023-09-08 NOTE — Telephone Encounter (Signed)
Patient's son is returning call. 

## 2023-09-08 NOTE — Telephone Encounter (Signed)
 Noted.  Thanks MJP

## 2023-09-08 NOTE — Telephone Encounter (Signed)
 Spoke with patient's son Shawn [name], he states he can confirm patient has been taking Eliquis  with no missed doses since Sunday July 20th.   Shawn [name] understands patient will need to take Eliquis  with no missed doses for 4 weeks prior to DCCV. He verbalized understanding to call if patient misses any doses.  DCCV rescheduled for 09/30/23 at 9:00 AM. Will mail instructions to patient's son Shawn [name] at [address] [place], [place] [address] per [facility] request.

## 2023-09-09 ENCOUNTER — Encounter (HOSPITAL_COMMUNITY): Admission: RE | Payer: Self-pay | Source: Home / Self Care

## 2023-09-09 ENCOUNTER — Ambulatory Visit (HOSPITAL_COMMUNITY): Admission: RE | Admit: 2023-09-09 | Source: Home / Self Care | Admitting: Cardiology

## 2023-09-09 DIAGNOSIS — I4819 Other persistent atrial fibrillation: Secondary | ICD-10-CM

## 2023-09-09 SURGERY — CARDIOVERSION (CATH LAB)
Anesthesia: General

## 2023-09-18 NOTE — Telephone Encounter (Signed)
 Noted.  Thanks MJP

## 2023-09-18 NOTE — Telephone Encounter (Signed)
 Spoke with son and patient has missed two more doses of eliquis . Son reports that he will call back to reschedule cardioversion once they have a better system with patient taking eliquis . Cardioversion has been canceled.

## 2023-09-18 NOTE — Telephone Encounter (Signed)
 [name] called back in stating has missed another dose of Eliquis  and needs push cardioversion back

## 2023-09-22 NOTE — Progress Notes (Addendum)
 Patient ID: TRAI ELLS                 DOB: [birth year]                      MRN: [id number]     HPI: Shawn [name] is a 88 y.o. male referred by Dr. Elmira to pharmacy clinic for HF medication management. PMH is significant for ypertension, hyperlipidemia, CAD s/p multiple prior PCI's and redo CABG in 2012, ischemic cardiomyopathy, PAF . Most recent LVEF 35 to 40%  on 07/17/2023. At last visit with Dr.Patwardhan on 06/26 pt was advised to stop atenolol  12.5 mg daily, and Imdur  30 mg daily. Instead, started metoprolol  succinate 25 mg daily, Entresto  24-26 mg twice daily, Jardiance  10 mg daily. Post change BMP was WNL NT-proBNP was 716.    The patient presented today accompanied by his son and grandson. He does not monitor his blood pressure or weight at home but reports feeling well. During the visit, we discussed the indications for his cardiac medications, introduced the GDMT clinic, explained the rationale for medication titration, emphasized the importance of adherence, and encouraged active patient engagement. He denies dizziness, lightheadedness, fatigue, chest pain, palpitations, lower extremity edema, paroxysmal nocturnal dyspnea, or orthopnea. He is able to perform all activities of daily living and reports a normal appetite while adhering to a low-salt diet.   Current CHF meds: metoprolol  succinate 25 mg daily, Entresto  24-26 mg twice daily, Jardiance  10 mg daily Previously tried: atenolol  12.5 mg daily, and Imdur  30 mg daily Adherence Assessment  Do you ever forget to take your medication? [] Yes [x] No  Do you ever skip doses due to side effects? [] Yes [x] No  Do you have trouble affording your medicines? [] Yes [x] No  Are you ever unable to pick up your medication due to transportation difficulties? [] Yes [x] No  Do you ever stop taking your medications because you don't believe they are helping? [] Yes [x] No  Do you check your weight daily? [] Yes [x] No   Adherence  strategy: pill box   Barriers to obtaining medications: none   BP goal: <130/80    Social History:  Alcohol: none  Smoking: 1 cigar per day motivated to change habit or reduce smoking to 1 cigar every other day  Diet: low salt diet   Exercise: none due to knee arthritis mobility is limited   Home BP readings:   Wt Readings from Last 3 Encounters:  08/13/23 173 lb (78.5 kg)  06/16/23 181 lb (82.1 kg)  04/21/22 178 lb 12.8 oz (81.1 kg)   BP Readings from Last 3 Encounters:  11/02/23 107/62  10/07/23 102/63  09/23/23 96/60   Pulse Readings from Last 3 Encounters:  11/02/23 61  10/07/23 81  09/23/23 (!) 43    Renal function: CrCl cannot be calculated (Unknown ideal weight.).  Past Medical History:  Diagnosis Date   Anterior myocardial infarction (HCC) 09/2009   thelbert 04/22/2010   Arthritis    knees (11/29/2013)   Bladder cancer (HCC) 2007   Chronic systolic CHF (congestive heart failure) (HCC)    Coronary artery disease    a. s/p CABG in 1992. b. stenting to Cx 2004/2005. c. BMS-SVG-LAD 2008. d. MI 2011 s/p stents to SVG-LAD. e. redo CABGx2 in 2012. f. LHC 2015: med rx.   Essential hypertension    Hyperglycemia    Hyperlipidemia    Hypotension    a. ACEI stopped 09/2015 due to this.   Ischemic cardiomyopathy  a. EF 40-45% by LHC 2015.   PAF (paroxysmal atrial fibrillation) (HCC)    Sinus bradycardia    a. HR 50s on prior EKG.    Current Outpatient Medications on File Prior to Visit  Medication Sig Dispense Refill   apixaban  (ELIQUIS ) 5 MG TABS tablet Take 1 tablet (5 mg total) by mouth 2 (two) times daily. Please keep upcoming appointment with Cardiologist. 180 tablet 0   atorvastatin  (LIPITOR ) 80 MG tablet TAKE 1 TABLET(80 MG) BY MOUTH EVERY DAY AT 6 PM 90 tablet 3   cholecalciferol (VITAMIN D3) 25 MCG (1000 UNIT) tablet Take 1,000 Units by mouth daily.     empagliflozin  (JARDIANCE ) 10 MG TABS tablet Take 1 tablet (10 mg total) by mouth daily before  breakfast. 30 tablet 6   empagliflozin  (JARDIANCE ) 10 MG TABS tablet Take 1 tablet (10 mg total) by mouth daily before breakfast. 14 tablet 0   metoprolol  succinate (TOPROL  XL) 25 MG 24 hr tablet Take 1 tablet (25 mg total) by mouth daily. 90 tablet 2   nitroGLYCERIN  (NITROSTAT ) 0.4 MG SL tablet Place 1 tablet (0.4 mg total) under the tongue every 5 (five) minutes x 3 doses as needed for chest pain. 25 tablet 5   sacubitril-valsartan (ENTRESTO ) 24-26 MG Take 1 tablet by mouth 2 (two) times daily. 60 tablet 2   No current facility-administered medications on file prior to visit.    Allergies  Allergen Reactions   Penicillins Rash     Assessment/Plan:  1. CHF -  HFrEF (heart failure with reduced ejection fraction) (HCC) Assessment and Plan: In office BP 96/60 mmHg heart rate 43 (goal <130/80). Takes current HF meds and tolerates them well without any side effects Denies SOB, palpitation, chest pain, headaches,or swelling Reiterated the importance of regular exercise and low salt diet  Given soft BP in the office not making any changes to his current meds  Advised to monitor BP and weight and bring log at next visit  In future may consider adding MRA agent if BP is not too low or may need to lower some of the current agent dose    HFrEF (heart failure with reduced ejection fraction) (HCC) Overview:  Current CHF meds: metoprolol  succinate 25 mg daily, Entresto  24-26 mg twice daily, Jardiance  10 mg daily, spironolactone  12.5 mg daily  Previously tried: atenolol  12.5 mg daily, and Imdur  30 mg daily Adherence Assessment  Do you ever forget to take your medication? [] Yes [x] No  Do you ever skip doses due to side effects? [] Yes [x] No  Do you have trouble affording your medicines? [] Yes [x] No  Are you ever unable to pick up your medication due to transportation difficulties? [] Yes [x] No  Do you ever stop taking your medications because you don't believe they are helping?  [] Yes [x] No  Do you check your weight daily? [] Yes [x] No   Adherence strategy: pill box   Barriers to obtaining medications: none   BP goal: <130/80   Assessment & Plan: Assessment and Plan: In office BP 96/60 mmHg heart rate 43 (goal <130/80). Takes current HF meds and tolerates them well without any side effects Denies SOB, palpitation, chest pain, headaches,or swelling Reiterated the importance of regular exercise and low salt diet  Given soft BP in the office not making any changes to his current meds  Advised to monitor BP and weight and bring log at next visit  In future may consider adding MRA agent if BP is not too low or may need to lower  some of the current agent dose      Other orders -     Omron 3 Series BP Monitor; Use to self monitor blood pressure daily as directed.  Dispense: 1 each; Refill: 0       Thank you   Robbi Blanch, Pharm.D [facility] [facility]IR[facility]. [facility] [address], [place], [place] [address] Phone: [phone]; Fax: [phone]

## 2023-09-23 ENCOUNTER — Encounter: Payer: Self-pay | Admitting: Pharmacist

## 2023-09-23 ENCOUNTER — Other Ambulatory Visit (HOSPITAL_COMMUNITY): Payer: Self-pay

## 2023-09-23 ENCOUNTER — Ambulatory Visit: Attending: Cardiology | Admitting: Pharmacist

## 2023-09-23 VITALS — BP 96/60 | HR 43

## 2023-09-23 DIAGNOSIS — I5020 Unspecified systolic (congestive) heart failure: Secondary | ICD-10-CM | POA: Diagnosis not present

## 2023-09-23 MED ORDER — OMRON 3 SERIES BP MONITOR DEVI
1.0000 [IU] | Freq: Every day | 0 refills | Status: DC
  Filled 2023-09-23: qty 1, 1d supply, fill #0

## 2023-09-23 NOTE — Assessment & Plan Note (Signed)
 Assessment and Plan: In office BP 96/60 mmHg heart rate 43 (goal <130/80). Takes current HF meds and tolerates them well without any side effects Denies SOB, palpitation, chest pain, headaches,or swelling Reiterated the importance of regular exercise and low salt diet  Given soft BP in the office not making any changes to his current meds  Advised to monitor BP and weight and bring log at next visit  In future may consider adding MRA agent if BP is not too low or may need to lower some of the current agent dose

## 2023-09-23 NOTE — Patient Instructions (Signed)
 No changes made to your heart medications by your pharmacist Robbi Blanch, PharmD at today's visit:   Bring all of your meds, your BP cuff and your record of home blood pressures to your next appointment.    HOW TO TAKE YOUR BLOOD PRESSURE AT HOME  Rest 5 minutes before taking your blood pressure.  Don't smoke or drink caffeinated beverages for at least 30 minutes before. Take your blood pressure before (not after) you eat. Sit comfortably with your back supported and both feet on the floor (don't cross your legs). Elevate your arm to heart level on a table or a desk. Use the proper sized cuff. It should fit smoothly and snugly around your bare upper arm. There should be enough room to slip a fingertip under the cuff. The bottom edge of the cuff should be 1 inch above the crease of the elbow. Ideally, take 3 measurements at one sitting and record the average.  Important lifestyle changes to control high blood pressure  Intervention  Effect on the BP  Lose extra pounds and watch your waistline Weight loss is one of the most effective lifestyle changes for controlling blood pressure. If you're overweight or obese, losing even a small amount of weight can help reduce blood pressure. Blood pressure might go down by about 1 millimeter of mercury (mm Hg) with each kilogram (about 2.2 pounds) of weight lost.  Exercise regularly As a general goal, aim for at least 30 minutes of moderate physical activity every day. Regular physical activity can lower high blood pressure by about 5 to 8 mm Hg.  Eat a healthy diet Eating a diet rich in whole grains, fruits, vegetables, and low-fat dairy products and low in saturated fat and cholesterol. A healthy diet can lower high blood pressure by up to 11 mm Hg.  Reduce salt (sodium) in your diet Even a small reduction of sodium in the diet can improve heart health and reduce high blood pressure by about 5 to 6 mm Hg.  Limit alcohol One drink equals 12 ounces of  beer, 5 ounces of wine, or 1.5 ounces of 80-proof liquor.  Limiting alcohol to less than one drink a day for women or two drinks a day for men can help lower blood pressure by about 4 mm Hg.   If you have any questions or concerns please use My Chart to send questions or call the office at [phone]

## 2023-09-29 ENCOUNTER — Telehealth (HOSPITAL_COMMUNITY): Payer: Self-pay | Admitting: Cardiology

## 2023-09-29 NOTE — Telephone Encounter (Signed)
 09/29/23 pt son cancelled  echocardiogram due to patient  hasnt hadthe cardioversion due to medicine issue . Pt son will call back to reschedule after pt had taken meds for 4 weeks in orer to have cardioversion. Order will be removed from the active echo WQ. If patient calls back we will reinstate the order. Thank you.

## 2023-09-29 NOTE — Telephone Encounter (Signed)
 Agree.  Thanks MJP

## 2023-09-30 ENCOUNTER — Encounter (HOSPITAL_COMMUNITY): Admission: RE | Payer: Self-pay | Source: Home / Self Care

## 2023-09-30 ENCOUNTER — Ambulatory Visit (HOSPITAL_COMMUNITY): Admission: RE | Admit: 2023-09-30 | Source: Home / Self Care | Admitting: Cardiology

## 2023-09-30 ENCOUNTER — Ambulatory Visit (HOSPITAL_COMMUNITY)

## 2023-09-30 DIAGNOSIS — I4819 Other persistent atrial fibrillation: Secondary | ICD-10-CM

## 2023-09-30 SURGERY — CARDIOVERSION (CATH LAB)
Anesthesia: General

## 2023-10-06 NOTE — Progress Notes (Unsigned)
 Patient ID: Shawn [name]                 DOB: February 08, 1936                      MRN: [id number]     HPI: [name] is a 88 y.o. male referred by Dr. Elmira to pharmacy clinic for HF medication management. PMH is significant for ypertension, hyperlipidemia, CAD s/p multiple prior PCI's and redo CABG in 2012, ischemic cardiomyopathy, PAF . Most recent LVEF 35 to 40%  on 07/17/2023. At last visit with Dr.Patwardhan on 06/26 pt was advised to stop atenolol  12.5 mg daily, and Imdur  30 mg daily. Instead, started metoprolol  succinate 25 mg daily, Entresto  24-26 mg twice daily, Jardiance  10 mg daily. Post change BMP was WNL NT-proBNP was 716.   The patient presented today for follow-up accompanied by his son. The son reported that the patient has been experiencing bedwetting accidents and inquired whether any of the patient's medications might cause this side effect. The patient is currently taking Jardiance  and metoprolol  in the morning, and Entresto  twice daily. The patient was advised to reduce fluid intake after 5 PM, as his bedtime is around 10 PM, to help minimize nighttime accidents. The patient denies dizziness, lightheadedness, fatigue, chest pain, palpitations, lower extremity edema, paroxysmal nocturnal dyspnea, or orthopnea. He isdenies dizziness, lightheadedness, fatigue, chest pain, palpitations, lower extremity edema, paroxysmal nocturnal dyspnea, or orthopnea.   Current CHF meds: metoprolol  succinate 25 mg daily, Entresto  24-26 mg twice daily, Jardiance  10 mg daily Previously tried: atenolol  12.5 mg daily, and Imdur  30 mg daily Adherence Assessment  Do you ever forget to take your medication? [] Yes [x] No  Do you ever skip doses due to side effects? [] Yes [x] No  Do you have trouble affording your medicines? [] Yes [x] No  Are you ever unable to pick up your medication due to transportation difficulties? [] Yes [x] No  Do you ever stop taking your medications because you  don't believe they are helping? [] Yes [x] No  Do you check your weight daily? [] Yes [x] No   Adherence strategy: pill box   Barriers to obtaining medications: none   BP goal: <130/80    Social History:  Alcohol: none  Smoking: 1 cigar per day motivated to change habit or reduce smoking to 1 cigar every other day  Diet: low salt diet   Exercise: none due to knee arthritis mobility is limited   Home BP readings:   Wt Readings from Last 3 Encounters:  08/13/23 173 lb (78.5 kg)  06/16/23 181 lb (82.1 kg)  04/21/22 178 lb 12.8 oz (81.1 kg)   BP Readings from Last 3 Encounters:  10/07/23 102/63  09/23/23 96/60  08/13/23 (!) 158/81   Pulse Readings from Last 3 Encounters:  10/07/23 81  09/23/23 (!) 43  08/13/23 (!) 105    Renal function: CrCl cannot be calculated (Patient's most recent lab result is older than the maximum 21 days allowed.).  Past Medical History:  Diagnosis Date   Anterior myocardial infarction (HCC) 09/2009   thelbert 04/22/2010   Arthritis    knees (11/29/2013)   Bladder cancer (HCC) 2007   Chronic systolic CHF (congestive heart failure) (HCC)    Coronary artery disease    a. s/p CABG in 1992. b. stenting to Cx 2004/2005. c. BMS-SVG-LAD 2008. d. MI 2011 s/p stents to SVG-LAD. e. redo CABGx2 in 2012. f. LHC 2015: med rx.   Essential hypertension    Hyperglycemia  Hyperlipidemia    Hypotension    a. ACEI stopped 09/2015 due to this.   Ischemic cardiomyopathy    a. EF 40-45% by LHC 2015.   PAF (paroxysmal atrial fibrillation) (HCC)    Sinus bradycardia    a. HR 50s on prior EKG.    Current Outpatient Medications on File Prior to Visit  Medication Sig Dispense Refill   apixaban  (ELIQUIS ) 5 MG TABS tablet Take 1 tablet (5 mg total) by mouth 2 (two) times daily. Please keep upcoming appointment with Cardiologist. 180 tablet 0   atorvastatin  (LIPITOR ) 80 MG tablet TAKE 1 TABLET(80 MG) BY MOUTH EVERY DAY AT 6 PM 90 tablet 3   cholecalciferol (VITAMIN  D3) 25 MCG (1000 UNIT) tablet Take 1,000 Units by mouth daily.     empagliflozin  (JARDIANCE ) 10 MG TABS tablet Take 1 tablet (10 mg total) by mouth daily before breakfast. 14 tablet 0   metoprolol  succinate (TOPROL  XL) 25 MG 24 hr tablet Take 1 tablet (25 mg total) by mouth daily. 90 tablet 2   nitroGLYCERIN  (NITROSTAT ) 0.4 MG SL tablet Place 1 tablet (0.4 mg total) under the tongue every 5 (five) minutes x 3 doses as needed for chest pain. 25 tablet 5   sacubitril-valsartan (ENTRESTO ) 24-26 MG Take 1 tablet by mouth 2 (two) times daily. 60 tablet 2   Blood Pressure Monitoring (OMRON 3 SERIES BP MONITOR) DEVI Use to self monitor blood pressure daily as directed. 1 each 0   empagliflozin  (JARDIANCE ) 10 MG TABS tablet Take 1 tablet (10 mg total) by mouth daily before breakfast. 30 tablet 6   No current facility-administered medications on file prior to visit.    Allergies  Allergen Reactions   Penicillins Rash     Assessment/Plan:  1. CHF -  HFrEF (heart failure with reduced ejection fraction) (HCC) Assessment: BP is controlled in office BP 102/63 mmHg heart rate 81 (goal <130/80). Takes current HF meds regularly and tolerates them well without any side effects Denies SOB, palpitation, chest pain, headaches,or swelling denies dizziness, lightheadedness, fatigue, chest pain, palpitations, lower extremity edema, paroxysmal nocturnal dyspnea, or orthopnea denies dizziness, lightheadedness, fatigue, chest pain, palpitations, lower extremity edema, paroxysmal nocturnal dyspnea, or orthopnea.  Plan:  Start taking spironolactone  12.5 mg daily  Continue taking metoprolol  succinate 25 mg daily, Entresto  24-26 mg twice daily, Jardiance  10 mg daily Patient to keep record of BP readings with heart rate and report to us  at the next visit Patient to see PharmD in 4 weeks for follow up  Follow up lab(s): BMP on 10/27/2023  HFrEF (heart failure with reduced ejection fraction)  (HCC) Overview:  Current CHF meds: metoprolol  succinate 25 mg daily, Entresto  24-26 mg twice daily, Jardiance  10 mg daily, spironolactone  12.5 mg daily  Previously tried: atenolol  12.5 mg daily, and Imdur  30 mg daily Adherence Assessment  Do you ever forget to take your medication? [] Yes [x] No  Do you ever skip doses due to side effects? [] Yes [x] No  Do you have trouble affording your medicines? [] Yes [x] No  Are you ever unable to pick up your medication due to transportation difficulties? [] Yes [x] No  Do you ever stop taking your medications because you don't believe they are helping? [] Yes [x] No  Do you check your weight daily? [] Yes [x] No   Adherence strategy: pill box   Barriers to obtaining medications: none   BP goal: <130/80   Assessment & Plan: Assessment: BP is controlled in office BP 102/63 mmHg heart rate 81 (goal <130/80). Takes current  HF meds regularly and tolerates them well without any side effects Denies SOB, palpitation, chest pain, headaches,or swelling denies dizziness, lightheadedness, fatigue, chest pain, palpitations, lower extremity edema, paroxysmal nocturnal dyspnea, or orthopnea denies dizziness, lightheadedness, fatigue, chest pain, palpitations, lower extremity edema, paroxysmal nocturnal dyspnea, or orthopnea.  Plan:  Start taking spironolactone  12.5 mg daily  Continue taking metoprolol  succinate 25 mg daily, Entresto  24-26 mg twice daily, Jardiance  10 mg daily Patient to keep record of BP readings with heart rate and report to us  at the next visit Patient to see PharmD in 4 weeks for follow up  Follow up lab(s): BMP on 10/27/2023   Orders: -     Basic metabolic panel with GFR  Other orders -     Spironolactone ; Take 0.5 tablets (12.5 mg total) by mouth daily.  Dispense: 90 tablet; Refill: 3       Thank you   Robbi Blanch, Pharm.D [place] [facility]sp[facility]. [facility] [address], [place],  [place] [address] Phone: [phone]; Fax: [phone]

## 2023-10-07 ENCOUNTER — Encounter: Payer: Self-pay | Admitting: Pharmacist

## 2023-10-07 ENCOUNTER — Ambulatory Visit: Attending: Cardiology | Admitting: Pharmacist

## 2023-10-07 VITALS — BP 102/63 | HR 81

## 2023-10-07 DIAGNOSIS — I5020 Unspecified systolic (congestive) heart failure: Secondary | ICD-10-CM | POA: Diagnosis not present

## 2023-10-07 MED ORDER — SPIRONOLACTONE 25 MG PO TABS: 12.5000 mg | ORAL_TABLET | Freq: Every day | ORAL | 3 refills | Status: AC

## 2023-10-07 NOTE — Patient Instructions (Addendum)
 Changes made by your pharmacist Robbi Blanch, PharmD at today's visit:    Instructions/Changes  (what do you need to do) Your Notes  (what you did and when you did it)  Start taking spironolactone  12.5 mg daily every morning and get BMP lab done on Sept 9, 2025    Continue taking metoprolol  succinate 25 mg daily, Entresto  24-26 mg twice daily, Jardiance  10 mg daily   Cut down on water  intake after 4-5 pm     Bring all of your meds, your BP cuff and your record of home blood pressures to your next appointment.    HOW TO TAKE YOUR BLOOD PRESSURE AT HOME  Rest 5 minutes before taking your blood pressure.  Don't smoke or drink caffeinated beverages for at least 30 minutes before. Take your blood pressure before (not after) you eat. Sit comfortably with your back supported and both feet on the floor (don't cross your legs). Elevate your arm to heart level on a table or a desk. Use the proper sized cuff. It should fit smoothly and snugly around your bare upper arm. There should be enough room to slip a fingertip under the cuff. The bottom edge of the cuff should be 1 inch above the crease of the elbow. Ideally, take 3 measurements at one sitting and record the average.  Important lifestyle changes to control high blood pressure  Intervention  Effect on the BP  Lose extra pounds and watch your waistline Weight loss is one of the most effective lifestyle changes for controlling blood pressure. If you're overweight or obese, losing even a small amount of weight can help reduce blood pressure. Blood pressure might go down by about 1 millimeter of mercury (mm Hg) with each kilogram (about 2.2 pounds) of weight lost.  Exercise regularly As a general goal, aim for at least 30 minutes of moderate physical activity every day. Regular physical activity can lower high blood pressure by about 5 to 8 mm Hg.  Eat a healthy diet Eating a diet rich in whole grains, fruits, vegetables, and low-fat dairy  products and low in saturated fat and cholesterol. A healthy diet can lower high blood pressure by up to 11 mm Hg.  Reduce salt (sodium) in your diet Even a small reduction of sodium in the diet can improve heart health and reduce high blood pressure by about 5 to 6 mm Hg.  Limit alcohol One drink equals 12 ounces of beer, 5 ounces of wine, or 1.5 ounces of 80-proof liquor.  Limiting alcohol to less than one drink a day for women or two drinks a day for men can help lower blood pressure by about 4 mm Hg.   If you have any questions or concerns please use My Chart to send questions or call the office at [phone]

## 2023-10-07 NOTE — Assessment & Plan Note (Signed)
 Assessment: BP is controlled in office BP 102/63 mmHg heart rate 81 (goal <130/80). Takes current HF meds regularly and tolerates them well without any side effects Denies SOB, palpitation, chest pain, headaches,or swelling denies dizziness, lightheadedness, fatigue, chest pain, palpitations, lower extremity edema, paroxysmal nocturnal dyspnea, or orthopnea denies dizziness, lightheadedness, fatigue, chest pain, palpitations, lower extremity edema, paroxysmal nocturnal dyspnea, or orthopnea.  Plan:  Start taking spironolactone  12.5 mg daily  Continue taking metoprolol  succinate 25 mg daily, Entresto  24-26 mg twice daily, Jardiance  10 mg daily Patient to keep record of BP readings with heart rate and report to us  at the next visit Patient to see PharmD in 4 weeks for follow up  Follow up lab(s): BMP on 10/27/2023

## 2023-10-08 ENCOUNTER — Ambulatory Visit: Admitting: Cardiology

## 2023-10-21 DIAGNOSIS — R4189 Other symptoms and signs involving cognitive functions and awareness: Secondary | ICD-10-CM | POA: Diagnosis not present

## 2023-10-21 DIAGNOSIS — R419 Unspecified symptoms and signs involving cognitive functions and awareness: Secondary | ICD-10-CM | POA: Diagnosis not present

## 2023-10-21 DIAGNOSIS — R634 Abnormal weight loss: Secondary | ICD-10-CM | POA: Diagnosis not present

## 2023-10-21 DIAGNOSIS — R296 Repeated falls: Secondary | ICD-10-CM | POA: Diagnosis not present

## 2023-10-27 DIAGNOSIS — I5020 Unspecified systolic (congestive) heart failure: Secondary | ICD-10-CM | POA: Diagnosis not present

## 2023-10-28 ENCOUNTER — Ambulatory Visit: Payer: Self-pay | Admitting: Pharmacist

## 2023-10-28 LAB — BASIC METABOLIC PANEL WITH GFR
BUN/Creatinine Ratio: 21 (ref 10–24)
BUN: 20 mg/dL (ref 8–27)
CO2: 25 mmol/L (ref 20–29)
Calcium: 9.1 mg/dL (ref 8.6–10.2)
Chloride: 101 mmol/L (ref 96–106)
Creatinine, Ser: 0.95 mg/dL (ref 0.76–1.27)
Glucose: 117 mg/dL — ABNORMAL HIGH (ref 70–99)
Potassium: 4.6 mmol/L (ref 3.5–5.2)
Sodium: 139 mmol/L (ref 134–144)
eGFR: 78 mL/min/1.73 (ref >59)

## 2023-11-02 ENCOUNTER — Ambulatory Visit: Attending: Cardiology | Admitting: Pharmacist

## 2023-11-02 ENCOUNTER — Encounter: Payer: Self-pay | Admitting: Pharmacist

## 2023-11-02 VITALS — BP 107/62 | HR 61

## 2023-11-02 DIAGNOSIS — I5020 Unspecified systolic (congestive) heart failure: Secondary | ICD-10-CM | POA: Insufficient documentation

## 2023-11-02 NOTE — Assessment & Plan Note (Addendum)
 Assessment and Plan: In office BP 107/62 mmHg heart rate 61 (goal <130/80). Takes current HF meds and tolerates them well without any side effects Denies SOB, palpitation, chest pain, headaches,or swelling Given soft BP we wont abe able to titrate his HF GDMT further  Current HF meds includes -metoprolol  succinate 25 mg daily, Entresto  24-26 mg twice daily, Jardiance  10 mg daily, spironolactone  12.5 mg daily

## 2023-11-02 NOTE — Progress Notes (Signed)
 Patient ID: Shawn [name]                 DOB: [birth year]                      MRN: [id number]     HPI: Shawn [name] is a 88 y.o. male referred by Shawn [name] to pharmacy clinic for HF medication management. PMH is significant for ypertension, hyperlipidemia, CAD s/p multiple prior PCI's and redo CABG in 2012, ischemic cardiomyopathy, PAF . Most recent LVEF 35 to 40%  on 07/17/2023. At last visit with Shawn [name] on 06/26 pt was advised to stop atenolol  12.5 mg daily, and Imdur  30 mg daily. Instead, started metoprolol  succinate 25 mg daily, Entresto  24-26 mg twice daily, Jardiance  10 mg daily. Post change BMP was WNL NT-proBNP was 716.  At last visit spironolactone  was added to his other GDMT.  The patient presented today for follow-up accompanied by his son. Home BP ~ 99/65. The patient denies dizziness, lightheadedness, fatigue, chest pain, palpitations, lower extremity edema, paroxysmal nocturnal dyspnea, or orthopnea. He isdenies dizziness, lightheadedness, fatigue, chest pain, palpitations, lower extremity edema, paroxysmal nocturnal dyspnea, or orthopnea.    Current CHF meds: metoprolol  succinate 25 mg daily, Entresto  24-26 mg twice daily, Jardiance  10 mg daily, spironolactone  12.5 mg daily  Previously tried: atenolol  12.5 mg daily, and Imdur  30 mg daily Adherence Assessment  Do you ever forget to take your medication? [] Yes [x] No  Do you ever skip doses due to side effects? [] Yes [x] No  Do you have trouble affording your medicines? [] Yes [x] No  Are you ever unable to pick up your medication due to transportation difficulties? [] Yes [x] No  Do you ever stop taking your medications because you don't believe they are helping? [] Yes [x] No  Do you check your weight daily? [] Yes [x] No   Adherence strategy: pill box   Barriers to obtaining medications: none   BP goal: <130/80    Social History:  Alcohol: none  Smoking: 1 cigar per day motivated to change habit or  reduce smoking to 1 cigar every other day  Diet: low salt diet   Exercise: none due to knee arthritis mobility is limited   Home BP readings:   Wt Readings from Last 3 Encounters:  08/13/23 173 lb (78.5 kg)  06/16/23 181 lb (82.1 kg)  04/21/22 178 lb 12.8 oz (81.1 kg)   BP Readings from Last 3 Encounters:  11/02/23 107/62  10/07/23 102/63  09/23/23 96/60   Pulse Readings from Last 3 Encounters:  11/02/23 61  10/07/23 81  09/23/23 (!) 43    Renal function: CrCl cannot be calculated (Unknown ideal weight.).  Past Medical History:  Diagnosis Date   Anterior myocardial infarction (HCC) 09/2009   thelbert 04/22/2010   Arthritis    knees (11/29/2013)   Bladder cancer (HCC) 2007   Chronic systolic CHF (congestive heart failure) (HCC)    Coronary artery disease    a. s/p CABG in 1992. b. stenting to Cx 2004/2005. c. BMS-SVG-LAD 2008. d. MI 2011 s/p stents to SVG-LAD. e. redo CABGx2 in 2012. f. LHC 2015: med rx.   Essential hypertension    Hyperglycemia    Hyperlipidemia    Hypotension    a. ACEI stopped 09/2015 due to this.   Ischemic cardiomyopathy    a. EF 40-45% by LHC 2015.   PAF (paroxysmal atrial fibrillation) (HCC)    Sinus bradycardia    a. HR 50s on prior EKG.  Current Outpatient Medications on File Prior to Visit  Medication Sig Dispense Refill   apixaban  (ELIQUIS ) 5 MG TABS tablet Take 1 tablet (5 mg total) by mouth 2 (two) times daily. Please keep upcoming appointment with Cardiologist. 180 tablet 0   atorvastatin  (LIPITOR ) 80 MG tablet TAKE 1 TABLET(80 MG) BY MOUTH EVERY DAY AT 6 PM 90 tablet 3   Blood Pressure Monitoring (OMRON 3 SERIES BP MONITOR) DEVI Use to self monitor blood pressure daily as directed. 1 each 0   cholecalciferol (VITAMIN D3) 25 MCG (1000 UNIT) tablet Take 1,000 Units by mouth daily.     empagliflozin  (JARDIANCE ) 10 MG TABS tablet Take 1 tablet (10 mg total) by mouth daily before breakfast. 30 tablet 6   empagliflozin  (JARDIANCE ) 10 MG  TABS tablet Take 1 tablet (10 mg total) by mouth daily before breakfast. 14 tablet 0   metoprolol  succinate (TOPROL  XL) 25 MG 24 hr tablet Take 1 tablet (25 mg total) by mouth daily. 90 tablet 2   sacubitril-valsartan (ENTRESTO ) 24-26 MG Take 1 tablet by mouth 2 (two) times daily. 60 tablet 2   spironolactone  (ALDACTONE ) 25 MG tablet Take 0.5 tablets (12.5 mg total) by mouth daily. 90 tablet 3   nitroGLYCERIN  (NITROSTAT ) 0.4 MG SL tablet Place 1 tablet (0.4 mg total) under the tongue every 5 (five) minutes x 3 doses as needed for chest pain. 25 tablet 5   No current facility-administered medications on file prior to visit.    Allergies  Allergen Reactions   Penicillins Rash     Assessment/Plan:  1. CHF -  HFrEF (heart failure with reduced ejection fraction) (HCC) Assessment and Plan: In office BP 107/62 mmHg heart rate 61 (goal <130/80). Takes current HF meds and tolerates them well without any side effects Denies SOB, palpitation, chest pain, headaches,or swelling Given soft BP we wont abe able to titrate his HF GDMT further  Current HF meds includes -metoprolol  succinate 25 mg daily, Entresto  24-26 mg twice daily, Jardiance  10 mg daily, spironolactone  12.5 mg daily      HFrEF (heart failure with reduced ejection fraction) (HCC) Overview:  Current CHF meds: metoprolol  succinate 25 mg daily, Entresto  24-26 mg twice daily, Jardiance  10 mg daily, spironolactone  12.5 mg daily  Previously tried: atenolol  12.5 mg daily, and Imdur  30 mg daily Adherence Assessment  Do you ever forget to take your medication? [] Yes [x] No  Do you ever skip doses due to side effects? [] Yes [x] No  Do you have trouble affording your medicines? [] Yes [x] No  Are you ever unable to pick up your medication due to transportation difficulties? [] Yes [x] No  Do you ever stop taking your medications because you don't believe they are helping? [] Yes [x] No  Do you check your weight daily? [] Yes [x] No    Adherence strategy: pill box   Barriers to obtaining medications: none   BP goal: <130/80   Assessment & Plan: Assessment and Plan: In office BP 107/62 mmHg heart rate 61 (goal <130/80). Takes current HF meds and tolerates them well without any side effects Denies SOB, palpitation, chest pain, headaches,or swelling Given soft BP we wont abe able to titrate his HF GDMT further  Current HF meds includes -metoprolol  succinate 25 mg daily, Entresto  24-26 mg twice daily, Jardiance  10 mg daily, spironolactone  12.5 mg daily         Thank you   Robbi Blanch, Pharm.D [facility] [address], [place], [place] [address] Phone: [phone]; Fax: [phone]

## 2023-11-16 ENCOUNTER — Ambulatory Visit: Admitting: Cardiology

## 2023-11-16 DIAGNOSIS — L03317 Cellulitis of buttock: Secondary | ICD-10-CM | POA: Diagnosis not present

## 2023-11-24 DIAGNOSIS — S81819A Laceration without foreign body, unspecified lower leg, initial encounter: Secondary | ICD-10-CM | POA: Diagnosis not present

## 2023-11-24 DIAGNOSIS — S51819A Laceration without foreign body of unspecified forearm, initial encounter: Secondary | ICD-10-CM | POA: Diagnosis not present

## 2023-11-24 DIAGNOSIS — R2689 Other abnormalities of gait and mobility: Secondary | ICD-10-CM | POA: Diagnosis not present

## 2023-11-24 DIAGNOSIS — R197 Diarrhea, unspecified: Secondary | ICD-10-CM | POA: Diagnosis not present

## 2023-11-24 DIAGNOSIS — M25552 Pain in left hip: Secondary | ICD-10-CM | POA: Diagnosis not present

## 2023-11-24 DIAGNOSIS — W19XXXA Unspecified fall, initial encounter: Secondary | ICD-10-CM | POA: Diagnosis not present

## 2023-12-13 ENCOUNTER — Other Ambulatory Visit: Payer: Self-pay | Admitting: Cardiology

## 2023-12-13 DIAGNOSIS — Z79899 Other long term (current) drug therapy: Secondary | ICD-10-CM

## 2023-12-13 DIAGNOSIS — Z01812 Encounter for preprocedural laboratory examination: Secondary | ICD-10-CM

## 2023-12-13 DIAGNOSIS — I5022 Chronic systolic (congestive) heart failure: Secondary | ICD-10-CM

## 2023-12-13 DIAGNOSIS — I5020 Unspecified systolic (congestive) heart failure: Secondary | ICD-10-CM

## 2023-12-13 DIAGNOSIS — I4811 Longstanding persistent atrial fibrillation: Secondary | ICD-10-CM

## 2023-12-13 DIAGNOSIS — I25810 Atherosclerosis of coronary artery bypass graft(s) without angina pectoris: Secondary | ICD-10-CM

## 2023-12-14 ENCOUNTER — Telehealth: Payer: Self-pay | Admitting: Cardiology

## 2023-12-14 NOTE — Telephone Encounter (Signed)
 Pt daughter would like a c/b from a nurse to go Medications before the appt tomorrow. Please Advise

## 2023-12-14 NOTE — Telephone Encounter (Signed)
 Spoke with pt's daughter per DPR regarding some issues the pt has been experiencing. Daughter stated the pt has an appointment tomorrow with Dr. Elmira however she is unable to come and wanted to make sure the doctor is aware of a few things. The pt has fallen a couple of times in the last month however daughter stated he never hit his head, he just sits down on the floor. The last time the pt fell was 10/16 while using his walker. Daughter believes he is taking too much medication that is lowering his blood pressure. Pt is also having explosive diarrhea that sometimes runs down his legs when he walks. The pt also has some swelling in his legs and daughter is concerned that he needs another fluid pill. Daughter denied that the pt has any chest pain or shortness of breath. Daughter was told that these symptoms can be discussed at the appointment tomorrow but was given ED precautions. Daughter was told that Dr. Elmira would be notified of his symptoms. Daughter verbalized understanding. All questions if any were answered.

## 2023-12-15 ENCOUNTER — Ambulatory Visit: Attending: Cardiology | Admitting: Cardiology

## 2023-12-15 ENCOUNTER — Encounter: Payer: Self-pay | Admitting: Cardiology

## 2023-12-15 ENCOUNTER — Other Ambulatory Visit (HOSPITAL_COMMUNITY): Payer: Self-pay

## 2023-12-15 VITALS — BP 106/72 | HR 65 | Ht 77.0 in | Wt 159.0 lb

## 2023-12-15 DIAGNOSIS — R2681 Unsteadiness on feet: Secondary | ICD-10-CM | POA: Diagnosis not present

## 2023-12-15 DIAGNOSIS — R634 Abnormal weight loss: Secondary | ICD-10-CM | POA: Diagnosis not present

## 2023-12-15 DIAGNOSIS — I5020 Unspecified systolic (congestive) heart failure: Secondary | ICD-10-CM | POA: Diagnosis not present

## 2023-12-15 DIAGNOSIS — R54 Age-related physical debility: Secondary | ICD-10-CM | POA: Diagnosis not present

## 2023-12-15 DIAGNOSIS — E538 Deficiency of other specified B group vitamins: Secondary | ICD-10-CM | POA: Diagnosis not present

## 2023-12-15 DIAGNOSIS — I10 Essential (primary) hypertension: Secondary | ICD-10-CM | POA: Diagnosis not present

## 2023-12-15 DIAGNOSIS — I25810 Atherosclerosis of coronary artery bypass graft(s) without angina pectoris: Secondary | ICD-10-CM | POA: Diagnosis not present

## 2023-12-15 DIAGNOSIS — Z01812 Encounter for preprocedural laboratory examination: Secondary | ICD-10-CM | POA: Diagnosis not present

## 2023-12-15 DIAGNOSIS — Z79899 Other long term (current) drug therapy: Secondary | ICD-10-CM | POA: Insufficient documentation

## 2023-12-15 DIAGNOSIS — I5022 Chronic systolic (congestive) heart failure: Secondary | ICD-10-CM | POA: Diagnosis not present

## 2023-12-15 DIAGNOSIS — I4811 Longstanding persistent atrial fibrillation: Secondary | ICD-10-CM | POA: Insufficient documentation

## 2023-12-15 DIAGNOSIS — R350 Frequency of micturition: Secondary | ICD-10-CM | POA: Diagnosis not present

## 2023-12-15 DIAGNOSIS — Z23 Encounter for immunization: Secondary | ICD-10-CM | POA: Diagnosis not present

## 2023-12-15 DIAGNOSIS — R1310 Dysphagia, unspecified: Secondary | ICD-10-CM | POA: Diagnosis not present

## 2023-12-15 DIAGNOSIS — I480 Paroxysmal atrial fibrillation: Secondary | ICD-10-CM | POA: Diagnosis not present

## 2023-12-15 MED ORDER — METOPROLOL SUCCINATE ER 50 MG PO TB24
50.0000 mg | ORAL_TABLET | Freq: Every day | ORAL | 3 refills | Status: DC
  Filled 2023-12-15: qty 90, 90d supply, fill #0

## 2023-12-15 MED ORDER — LOSARTAN POTASSIUM 25 MG PO TABS
12.5000 mg | ORAL_TABLET | Freq: Every day | ORAL | 3 refills | Status: DC
  Filled 2023-12-15: qty 45, 90d supply, fill #0

## 2023-12-15 MED ORDER — RIVAROXABAN 20 MG PO TABS
20.0000 mg | ORAL_TABLET | Freq: Every day | ORAL | 3 refills | Status: DC
  Filled 2023-12-15: qty 90, 90d supply, fill #0

## 2023-12-15 NOTE — Progress Notes (Signed)
 Cardiology Office Note:  .   Date:  12/15/2023  ID:  Shawn [name], DOB 06-May-1935, MRN [id number] PCP: Gib Charleston, MD  [facility] Providers Cardiologist:  Newman Lawrence, MD PCP: Gib Charleston, MD  No chief complaint on file.    Shawn [name] is a 88 y.o. male with hypertension, hyperlipidemia, CAD s/p multiple prior PCI's and redo CABG in 2012, ischemic cardiomyopathy, PAF  Patient is here with his son today.  Recently, he has had few episodes of fall, there is also been concerned about low blood pressure.  He denies any leg edema at this time.  He has had few episodes of explosive diarrhea, which he thinks are due to Eliquis .  Vitals:   12/15/23 0919  BP: 106/72  Pulse: 65  SpO2: 96%        Review of Systems  Cardiovascular:  Negative for chest pain, dyspnea on exertion, leg swelling, palpitations and syncope.  Musculoskeletal:  Positive for falls.  Gastrointestinal:  Positive for diarrhea.        Studies Reviewed: SABRA        EKG 10/282025: Atrial flutter 128 bpm Left axis deviation Anteroseptal infarct (cited on or before 30-Nov-2013) When compared with ECG of 13-Aug-2023 12:02, Questionable change in QRS duration Questionable change in initial forces of Anterior leads      EKG 06/16/2023: Atrial fibrillation with controlled ventricular rate Left axis deviation Non-specific intra-ventricular conduction block Cannot rule out Anteroseptal infarct (cited on or before 30-Nov-2013) When compared with ECG of 01-Dec-2013 06:10, Atrial flutter has replaced Sinus rhythm QRS duration has increased Nonspecific T wave abnormality, improved in Lateral leads    Labs 05/2023: Chol 117, TG 74, HDL 41, LDL 61  01/2023: Chol 141, TG 81, HDL 41, LDL 84 HbA1C 6.8% Hb 14.7 Cr 0.93 TSH 2.3  Echocardiogram 06/2023:  1. Left ventricular ejection fraction, by estimation, is 35 to 40%. The  left ventricle has moderately decreased function. The  left ventricle  demonstrates regional wall motion abnormalities. The entire septum  and entire inferior wall are hypokinetic. The entire anterior wall, entire  lateral wall, and apex are normal.  There is mild asymmetric left ventricular hypertrophy of the basal-septal segment. Left ventricular diastolic parameters  are indeterminate.   2. Right ventricular systolic function is mildly reduced. The right  ventricular size is normal. Tricuspid regurgitation signal is inadequate  for assessing PA pressure.   3. Left atrial size was mildly dilated.   4. Right atrial size was mildly dilated.   5. The mitral valve is normal in structure. Trivial mitral valve  regurgitation. No evidence of mitral stenosis.   6. The aortic valve is tricuspid. Aortic valve regurgitation is mild.  Aortic valve sclerosis/calcification is present, without any evidence of  aortic stenosis.   7. Aortic dilatation noted. There is dilatation of the aortic root,  measuring 41 mm.   8. The inferior vena cava is normal in size with greater than 50%  respiratory variability, suggesting right atrial pressure of 3 mmHg.   Coronary angiography 2015: LM: Normal LAD: Proximal occlusion LCx: 70% proximal disease, moderate ISR mid vessel RCA: Mid 70-90% disease, followed by a complete occlusion RIMA-LAD: Patent SVG-OM: Patent Atretic LIMA, occluded SVG-LAD, occluded SVG-RCA.   Risk Assessment/Calculations:    CHA2DS2-VASc Score = 5  This indicates a 7.2% annual risk of stroke. The patient's score is based upon: CHF History: 1 HTN History: 1 Diabetes History: 0 Stroke History: 0 Vascular Disease History: 1 Age  Score: 2 Gender Score: 0     Physical Exam Vitals and nursing note reviewed.  Constitutional:      General: He is not in acute distress. Neck:     Vascular: No JVD.  Cardiovascular:     Rate and Rhythm: Tachycardia present. Rhythm irregular.     Heart sounds: Normal heart sounds. No murmur  heard. Pulmonary:     Effort: Pulmonary effort is normal.     Breath sounds: Normal breath sounds. No wheezing or rales.  Musculoskeletal:     Right lower leg: No edema.     Left lower leg: No edema.  Skin:    Comments: Prominent varicosities seen in both legs      VISIT DIAGNOSES:   ICD-10-CM   1. Coronary artery disease involving coronary bypass graft of native heart without angina pectoris  I25.810 EKG 12-Lead    metoprolol  succinate (TOPROL  XL) 50 MG 24 hr tablet    2. HFrEF (heart failure with reduced ejection fraction) (HCC)  I50.20 metoprolol  succinate (TOPROL  XL) 50 MG 24 hr tablet    3. Longstanding persistent atrial fibrillation (HCC)  I48.11 metoprolol  succinate (TOPROL  XL) 50 MG 24 hr tablet    4. Chronic systolic CHF (congestive heart failure) (HCC)  I50.22 metoprolol  succinate (TOPROL  XL) 50 MG 24 hr tablet    5. Medication management  Z79.899 metoprolol  succinate (TOPROL  XL) 50 MG 24 hr tablet    6. Pre-procedure lab exam  Z01.812 metoprolol  succinate (TOPROL  XL) 50 MG 24 hr tablet        Shawn [name] is a 88 y.o. male with hypertension, hyperlipidemia, CAD s/p multiple prior PCI's and redo CABG in 2012, ischemic cardiomyopathy, PAF  Assessment & Plan  Cardiomyopathy: EF 35-40%.  Likely ischemic cardiomyopathy with prior history of CABG, although no recent anginal symptoms. I suspect low EF could also be partly due to his atrial flutter/fibrillation.  He has never had cardioversion, there is only biatrial dilatation on echocardiogram.  Recently, cardioversion has been canceled at least once due to occasional noncompliance with Eliquis .  Coupled with that, and is concern for diarrhea being side effect of Eliquis , will change him to Xarelto 20 mg daily today. Given concern for low blood pressure and dehydration, I have stopped his Jardiance  and Entresto , and instead started losartan 12.5 mg daily. Given poor heart rate control and persistent A-fib/flutter, I  will increase metoprolol  succinate to 50 mg daily. Check BMP, proBNP in 1 week.  Persistent A-fib/flutter: Poor rate control.  Increase metoprolol  succinate to 50 mg daily.  Follow-up in 2 weeks.  If rate control remains poor, could consider TEE guided cardioversion, otherwise can consider cardioversion without TEE after 4 weeks of uninterrupted Xarelto use. He has had some dysphagia in the past for which she has undergone barium swallow eval, which reportedly did not show any strictures.  As such, TEE not an absolute contraindication, and could be considered if rate control remains poor on metoprolol . I also discussed with the patient regarding Watchman device given recent falls, but patient is not interested at this time.  CAD: Prior redo CABG, no anginal symptoms at this time. Not on aspirin  due to ongoing use of Eliquis  for A-fib. LDL 41 on Lipitor  80 mg daily and Zetia  10 mg daily. Continue the same.    Meds ordered this encounter  Medications   losartan (COZAAR) 25 MG tablet    Sig: Take 1/2 tablet (12.5 mg total) by mouth daily.    Dispense:  45 tablet    Refill:  3   rivaroxaban (XARELTO) 20 MG TABS tablet    Sig: Take 1 tablet (20 mg total) by mouth daily with supper.    Dispense:  90 tablet    Refill:  3    NEW START   metoprolol  succinate (TOPROL  XL) 50 MG 24 hr tablet    Sig: Take 1 tablet (50 mg total) by mouth daily.    Dispense:  90 tablet    Refill:  3     F/u in 6 months  Signed, Newman JINNY Lawrence, MD

## 2023-12-15 NOTE — Patient Instructions (Signed)
 Medication Instructions:  STOP Entresto   STOP Eliquis  STOP Jardiance    CHANGE Metoprolol  to 50 mg daily   START Losartan 12.5 mg daily  START Xarelto 20 mg daily   *If you need a refill on your cardiac medications before your next appointment, please call your pharmacy*   Follow-Up: At [facility], you and your health needs are our priority.  As part of our continuing mission to provide you with exceptional heart care, our providers are all part of one team.  This team includes your primary Cardiologist (physician) and Advanced Practice Providers or APPs (Physician Assistants and Nurse Practitioners) who all work together to provide you with the care you need, when you need it.  Your next appointment:   2-3 week(s)  Provider:   One of our Advanced Practice Providers (APPs): Morse Clause, PA-C  Lamarr Satterfield, NP Miriam Shams, NP  Olivia Pavy, PA-C Josefa Beauvais, NP  Leontine Salen, PA-C Orren Fabry, PA-C  Hao Meng, PA-C Ernest Dick, NP  Damien Braver, NP Jon Hails, PA-C  Waddell Donath, PA-C    Dayna Dunn, PA-C  Scott Weaver, PA-C Lum Louis, NP Katlyn West, NP Callie Goodrich, PA-C  Xika Zhao, NP Sheng Haley, PA-C    Kathleen Johnson, PA-C   We recommend signing up for the patient portal called MyChart.  Sign up information is provided on this After Visit Summary.  MyChart is used to connect with patients for Virtual Visits (Telemedicine).  Patients are able to view lab/test results, encounter notes, upcoming appointments, etc.  Non-urgent messages can be sent to your provider as well.   To learn more about what you can do with MyChart, go to [email or web address].

## 2023-12-29 ENCOUNTER — Emergency Department (HOSPITAL_BASED_OUTPATIENT_CLINIC_OR_DEPARTMENT_OTHER)
Admission: EM | Admit: 2023-12-29 | Discharge: 2023-12-29 | Disposition: A | Discharge status: Home / Self Care | Source: Ambulatory Visit | Attending: Emergency Medicine | Admitting: Emergency Medicine

## 2023-12-29 ENCOUNTER — Encounter (HOSPITAL_BASED_OUTPATIENT_CLINIC_OR_DEPARTMENT_OTHER): Payer: Self-pay

## 2023-12-29 ENCOUNTER — Other Ambulatory Visit: Payer: Self-pay

## 2023-12-29 ENCOUNTER — Encounter (INDEPENDENT_AMBULATORY_CARE_PROVIDER_SITE_OTHER): Payer: Self-pay | Admitting: Otolaryngology

## 2023-12-29 ENCOUNTER — Emergency Department (HOSPITAL_BASED_OUTPATIENT_CLINIC_OR_DEPARTMENT_OTHER): Admitting: Radiology

## 2023-12-29 ENCOUNTER — Ambulatory Visit (INDEPENDENT_AMBULATORY_CARE_PROVIDER_SITE_OTHER): Admitting: Otolaryngology

## 2023-12-29 VITALS — BP 128/76 | HR 88 | Temp 97.8°F | Ht 77.0 in | Wt 159.0 lb

## 2023-12-29 DIAGNOSIS — R1312 Dysphagia, oropharyngeal phase: Secondary | ICD-10-CM

## 2023-12-29 DIAGNOSIS — Z7901 Long term (current) use of anticoagulants: Secondary | ICD-10-CM | POA: Insufficient documentation

## 2023-12-29 DIAGNOSIS — R600 Localized edema: Secondary | ICD-10-CM | POA: Insufficient documentation

## 2023-12-29 DIAGNOSIS — R1310 Dysphagia, unspecified: Secondary | ICD-10-CM

## 2023-12-29 DIAGNOSIS — R0902 Hypoxemia: Secondary | ICD-10-CM | POA: Diagnosis not present

## 2023-12-29 DIAGNOSIS — R4582 Worries: Secondary | ICD-10-CM | POA: Diagnosis not present

## 2023-12-29 DIAGNOSIS — J984 Other disorders of lung: Secondary | ICD-10-CM | POA: Diagnosis not present

## 2023-12-29 DIAGNOSIS — R490 Dysphonia: Secondary | ICD-10-CM

## 2023-12-29 DIAGNOSIS — Z711 Person with feared health complaint in whom no diagnosis is made: Secondary | ICD-10-CM

## 2023-12-29 DIAGNOSIS — R918 Other nonspecific abnormal finding of lung field: Secondary | ICD-10-CM | POA: Diagnosis not present

## 2023-12-29 NOTE — Progress Notes (Signed)
 Reason for Consult: Dysphagia and hoarseness Referring Physician: Dr. Gib Durand Shawn [name] is an 88 y.o. male.  HPI: History of dysphagia.  Is been going on for at least a year.  He has had barium swallow which showed some dysphagia and aspiration.  He had a modified test and they recommended speech therapy.  He has been doing that for about 1 week.  He has hoarseness also for over a year.  It is a harsh voice.  No stridor.  He has certainly a significant number of medical problems and is progressively become more weak over the last year or 2.  No throat pain.  No odynophagia.  Past Medical History:  Diagnosis Date   Anterior myocardial infarction (HCC) 09/2009   thelbert 04/22/2010   Arthritis    knees (11/29/2013)   Bladder cancer (HCC) 2007   Chronic systolic CHF (congestive heart failure) (HCC)    Coronary artery disease    a. s/p CABG in 1992. b. stenting to Cx 2004/2005. c. BMS-SVG-LAD 2008. d. MI 2011 s/p stents to SVG-LAD. e. redo CABGx2 in 2012. f. LHC 2015: med rx.   Essential hypertension    Hyperglycemia    Hyperlipidemia    Hypotension    a. ACEI stopped 09/2015 due to this.   Ischemic cardiomyopathy    a. EF 40-45% by LHC 2015.   PAF (paroxysmal atrial fibrillation) (HCC)    Sinus bradycardia    a. HR 50s on prior EKG.    Past Surgical History:  Procedure Laterality Date   APPENDECTOMY  1940's   CARDIAC CATHETERIZATION  09/2006   thelbert 06/20/2010   CATARACT EXTRACTION W/ INTRAOCULAR LENS  IMPLANT, BILATERAL Bilateral 1990's   CORONARY ANGIOPLASTY WITH STENT PLACEMENT  several since 1992   CORONARY ARTERY BYPASS GRAFT  1992; ~2012   CABG X3; CABH X 3   INGUINAL HERNIA REPAIR N/A 01/26/2020   Procedure: LAPAROSCOPIC BILATERAL INGUINAL HERNIA REPAIR, BILATERAL TAP BLOCK;  Surgeon: Sheldon Standing, MD;  Location: [facility];  Service: General;  Laterality: N/A;   JOINT REPLACEMENT     LEFT HEART CATHETERIZATION WITH CORONARY/GRAFT ANGIOGRAM N/A 11/30/2013   Procedure:  LEFT HEART CATHETERIZATION WITH EL BILE;  Surgeon: Debby DELENA Sor, MD;  Location: [facility] CATH LAB;  Service: Cardiovascular;  Laterality: N/A;   TONSILLECTOMY  1940's   TOTAL HIP ARTHROPLASTY Left 1992   TOTAL HIP ARTHROPLASTY Right ~ 1994    Family History  Problem Relation Age of Onset   Heart disease Father     Social History:  reports that he has been smoking cigars and cigarettes. He started smoking about 50 years ago. He has never used smokeless tobacco. He reports that he does not drink alcohol and does not use drugs.  Allergies:  Allergies  Allergen Reactions   Penicillins Rash    Medications: I have reviewed the patient's current medications.  No results found for this or any previous visit (from the past 48 hours).  No results found.  ROS Blood pressure 128/76, pulse 88, temperature 97.8 F (36.6 C), height 6' 5 (1.956 m), weight 159 lb (72.1 kg), SpO2 (!) 73%. Physical Exam Constitutional:      Appearance: Normal appearance.  HENT:     Head: Normocephalic and atraumatic.     Right Ear: Tympanic membrane is without lesions and middle ear aerated, ear canal and external ear normal.     Left Ear: Tympanic membrane is without lesions and middle ear aerated, ear canal and external ear  normal.     Nose: Nose normal. Turbinates with mild hypertrophy, No significant swelling or masses.     Oral cavity/oropharynx: Mucous membranes are moist. No lesions or masses    Larynx: normal voice. Mirror attempted without success    Eyes:     Extraocular Movements: Extraocular movements intact.     Conjunctiva/sclera: Conjunctivae normal.     Pupils: Pupils are equal, round, and reactive to light.  Cardiovascular:     Rate and Rhythm: Normal rate.  Pulmonary:     Effort: Pulmonary effort is normal.  Musculoskeletal:     Cervical back: Normal range of motion and neck supple. No rigidity.  Lymphadenopathy:     Cervical: No cervical adenopathy or masses.salivary  glands without lesions. .  Neurological:     Mental Status: He is alert. CN 2-12 intact. No nystagmus   Flexible fibroptic laryngoscopy  Patient was informed of risks, benefits, and options. All questions answered. Consent obtained.   The scope was passed through the nose and tracked into the nasopharynx. The nasopharynx without lesions or masses. The scope was positioned over the base of tongue and epiglottis. There was no obvious lesions or significant swelling and any of the laryngeal or pharyngeal structures. The vocal cords move welll and there is some bowing of both vocal cords.  There is a definite pooling of saliva across the posterior cricoid and hypopharynx area.  The subglottis has minimal visualization but no lesions identified. The scope was removed without difficulty and patient tolerated well.     Assessment/Plan: Dysphagia/hoarseness-it appears he has bowing of the vocal cords and just muscle weakness in all likelihood.  There is no lesions or masses.  He pulls saliva in his hypopharynx fairly significantly.  He does have a low oxygen level here in the office so I recommended they call their primary care doctor immediately.  The patient did not have any symptoms of shortness of breath or feeling of distress.  He walked out of the office with his walker without ever feeling short of breath.  We talked about the options for the dysphagia and right now he just needs to continue speech therapy to try to help him with maneuvers and rehabilitation.  I do not have any intervention to recommend.  Norleen Notice 12/29/2023, 11:23 AM

## 2023-12-29 NOTE — Discharge Instructions (Signed)
 As we discussed, your oxygen level in the emergency department is found to be normal. You can be discharged home.   Return to the ED with any shortness of breath, cough, excessive fatigue, chest tightness or pain, or for new concern.

## 2023-12-29 NOTE — ED Provider Notes (Signed)
 [place] EMERGENCY DEPARTMENT AT [facility] Provider Note   CSN: [id number] Arrival date & time: 12/29/23  1336     Patient presents with: Low O2    Shawn [name] is a 88 y.o. male.   Patient to ED with family member for evaluation of low oxygen saturation. He was at a routine appointment today (ENT with Dr. Roark) and his O2 saturation was recorded at 73%. It was noted there was no pallor or cyanosis. He denies SOB, cough, any pain, fatigue. He has been at his baseline activity today, walking with his walker without dyspnea. Normal appetite. No nausea, vomiting. No peripheral swelling. Family member states they went to Urgent Care for further evaluation as recommended by Dr. Roark' office but they were unable to measure is oxygenation, felt due to his hands being cold, so he was sent to ED.   The history is provided by the patient and a relative. No language interpreter was used.       Prior to Admission medications   Medication Sig Start Date End Date Taking? Authorizing Provider  atorvastatin  (LIPITOR ) 80 MG tablet TAKE 1 TABLET(80 MG) BY MOUTH EVERY DAY AT 6 PM 06/16/23   Patwardhan, Manish J, MD  Blood Pressure Monitoring (OMRON 3 SERIES BP MONITOR) DEVI Use to self monitor blood pressure daily as directed. 09/23/23   Patwardhan, Newman JINNY, MD  cholecalciferol (VITAMIN D3) 25 MCG (1000 UNIT) tablet Take 1,000 Units by mouth daily.    [provider]  losartan (COZAAR) 25 MG tablet Take 1/2 tablet (12.5 mg total) by mouth daily. 12/15/23   Patwardhan, Newman JINNY, MD  metoprolol  succinate (TOPROL  XL) 50 MG 24 hr tablet Take 1 tablet (50 mg total) by mouth daily. 12/15/23   Patwardhan, Newman JINNY, MD  nitroGLYCERIN  (NITROSTAT ) 0.4 MG SL tablet Place 1 tablet (0.4 mg total) under the tongue every 5 (five) minutes x 3 doses as needed for chest pain. 05/06/17   Army Katheryn BROCKS, NP  rivaroxaban (XARELTO) 20 MG TABS tablet Take 1 tablet (20 mg total) by mouth daily with  supper. 12/15/23   Patwardhan, Newman JINNY, MD  spironolactone  (ALDACTONE ) 25 MG tablet Take 0.5 tablets (12.5 mg total) by mouth daily. 10/07/23 01/05/24  PatwardhanNewman JINNY, MD    Allergies: Penicillins    Review of Systems  Updated Vital Signs BP (!) 108/46   Pulse 60   Temp (!) 97.5 F (36.4 C) (Oral)   Resp 17   SpO2 98% Comment: taken on the ear  Physical Exam Vitals and nursing note reviewed.  Constitutional:      General: He is not in acute distress.    Appearance: Normal appearance. He is not ill-appearing.  HENT:     Mouth/Throat:     Mouth: Mucous membranes are moist.  Eyes:     Conjunctiva/sclera: Conjunctivae normal.  Cardiovascular:     Rate and Rhythm: Normal rate.     Heart sounds: No murmur heard. Pulmonary:     Effort: Pulmonary effort is normal.     Breath sounds: No wheezing, rhonchi or rales.     Comments: Good air movement to all fields. Musculoskeletal:     Cervical back: Normal range of motion and neck supple.     Right lower leg: Edema present.     Left lower leg: Edema present.     Comments: There is pitting edema to ankles.   Skin:    General: Skin is warm and dry.  Comments: No cyanosis  Neurological:     Mental Status: He is alert and oriented to person, place, and time.     (all labs ordered are listed, but only abnormal results are displayed) Labs Reviewed - No data to display  EKG: EKG Interpretation Date/Time:  Tuesday December 29 2023 13:49:07 EST Ventricular Rate:  59 PR Interval:    QRS Duration:  130 QT Interval:  473 QTC Calculation: 469 R Axis:   253  Text Interpretation: Atrial flutter with predominant 4:1 AV block Right bundle branch block Anterolateral infarct, age indeterminate Although rate has decreased No significant change was found Confirmed by Ellouise Fine ([id number]) on 12/29/2023 3:38:04 PM  Radiology: DG Chest 2 View Result Date: 12/29/2023 EXAM: 2 VIEW(S) XRAY OF THE CHEST 12/29/2023 02:08:00 PM  COMPARISON: Prior examination chest x-ray 11/29/2013. CLINICAL HISTORY: low o2 sats, however resolved, just in case FINDINGS: LINES, TUBES AND DEVICES: CABG markers noted. Sternotomy wires noted. LUNGS AND PLEURA: Chronic emphysematous changes and pulmonary scarring. No acute overlying pulmonary process. No pulmonary edema. No pleural effusion. No pneumothorax. HEART AND MEDIASTINUM: The cardiac silhouette, mediastinal, and hilar contours are within normal limits and stable. BONES AND SOFT TISSUES: No acute osseous abnormality. IMPRESSION: 1. No acute cardiopulmonary process. 2. Chronic emphysematous changes and pulmonary scarring. Electronically signed by: Maude Stammer MD 12/29/2023 02:21 PM EST RP Workstation: [id number]     Procedures   Medications Ordered in the ED - No data to display  Clinical Course as of 12/29/23 1638  Tue Dec 29, 2023  1631 Patient to ED with concern for low O2 saturation noticed on routine ENT office appointment today. Patient has no symptoms c/w hypoxia.   O2 saturations have been difficult to obtain on his fingers. They are cool to touch. Ear probe used with good wave form showing 98%. CXR clear, without edema, consolidation or infiltrates.   Low O2 saturation felt secondary to hands that are cold. Normal O2 saturation with use of ear probe. Patient and family member are happy to be discharged home.  [SU]    Clinical Course User Index [SU] Odell Balls, PA-C                                 Medical Decision Making Amount and/or Complexity of Data Reviewed Radiology: ordered.        Final diagnoses:  Worried well    ED Discharge Orders     None          Odell Balls, PA-C 12/29/23 1638    Ellouise Fine K, DO 12/29/23 2241

## 2023-12-29 NOTE — ED Triage Notes (Signed)
 Patient was sent here from his ENT while there for a routine visit. They found his O2 to be 73% and sent him to UC who sent him to us . He is not reporting any shortness of breath, ambulating, speaking in full sentences, and no cyanosis or abnormal pallor. Patient has very cold extremities and is wearing gloves. Trying ear probe for confirmation.

## 2023-12-29 NOTE — ED Triage Notes (Signed)
 RT at bedside, lungs clear, O2 98% room air.

## 2024-01-05 DIAGNOSIS — I5022 Chronic systolic (congestive) heart failure: Secondary | ICD-10-CM | POA: Diagnosis not present

## 2024-01-05 DIAGNOSIS — Z713 Dietary counseling and surveillance: Secondary | ICD-10-CM | POA: Diagnosis not present

## 2024-01-05 DIAGNOSIS — I10 Essential (primary) hypertension: Secondary | ICD-10-CM | POA: Diagnosis not present

## 2024-01-05 DIAGNOSIS — E1159 Type 2 diabetes mellitus with other circulatory complications: Secondary | ICD-10-CM | POA: Diagnosis not present

## 2024-01-05 DIAGNOSIS — D72820 Lymphocytosis (symptomatic): Secondary | ICD-10-CM | POA: Diagnosis not present

## 2024-01-05 NOTE — Progress Notes (Unsigned)
 Cardiology Office Note:  .   Date:  01/06/2024  ID:  Aida JINNY Ditch, DOB 1935-11-06, MRN [id number] PCP: Rolinda Millman, MD  [facility] Providers Cardiologist:  Newman JINNY Lawrence, MD {  History of Present Illness: .   Shawn [name] is a 88 y.o. male with history of CAD status post multiple prior PCI's and redo CABG in 2012, left bundle branch block, ischemic cardiomyopathy with reduced EF, longstanding persistent atrial fibrillation/flutter, hypertension, hyperlipidemia, diabetes     CAD s/p CABG in 1992, stenting to Cx 2004/2005, BMS-SVG-LAD 2008, MI 2011 with stents to the SVG to LAD, repeat CABG 2012.  Prior to [facility] health. 2015 left heart catheterization with handwritten report.  ICM Long history of ICM, mildly reduced EF on left heart catheterization 2015 06/2023 EF 35 to 40%  Atrial fibrillation/flutter 2019 last time he was in sinus 2020-2025 in atrial flutter  Social history  Son takes care of him     Patient with longstanding persistent atrial fibrillation/flutter since at least 2020.  Also with history of redo CABG and ischemic cardiomyopathy with reduced EF. There have been plans for cardioversion although he has had issues with noncompliance so this has been delayed multiple times.  Last seen 11/2023 and he had reported diarrhea with Eliquis  so this was transition to Xarelto 20 mg.  There was concern for low blood pressure and dehydration so Jardiance  and Entresto  were stopped, he was started on losartan 12.5 mg.  Toprol -XL was increased to 50 mg.  Plan was possible cardioversion plus or minus TEE.  He does have history of dysphagia has barium swallow test with no evidence of strictures but with moderate oropharyngeal dysphagia.  Today patient presents for follow-up.  He is accompanied with his son who provides the entire interview as patient forgot his hearing aids and unable to participate in the visit today.  He reports that patient lives at home alone  but does have intermittent help throughout the week.  PT comes by at least twice a week and has been helping him with mobility.  Additionally they have been working with him to increase p.o. intake and have been intentionally trying to get him to gain some weight.  He does report that he missed a dose of his Xarelto this Sunday.  Also had a fall last night but did not sustain any injuries and feeling well otherwise.  He does have history of falls in the past and generally this has been attributed to balance issues but yesterday's fall was more because he tripped over his walker.  Otherwise has not had any other complaints of chest pain, shortness of breath, orthopnea.  Doing well otherwise. SLP also helps him with his dysphagia.   ROS: Denies: Chest pain, shortness of breath, orthopnea, peripheral edema, palpitations, decreased exercise intolerance, fatigue, lightheadedness.   Studies Reviewed: .         Risk Assessment/Calculations:    CHA2DS2-VASc Score = 5   This indicates a 7.2% annual risk of stroke. The patient's score is based upon: CHF History: 1 HTN History: 1 Diabetes History: 0 Stroke History: 0 Vascular Disease History: 1 Age Score: 2 Gender Score: 0          Physical Exam:   VS:  BP 129/66   Pulse 76   Ht 6' 5 (1.956 m)   Wt 165 lb (74.8 kg)   SpO2 90% Comment: hands cold  BMI 19.57 kg/m    Wt Readings from Last 3 Encounters:  01/06/24 165 lb (74.8 kg)  12/29/23 159 lb (72.1 kg)  12/15/23 159 lb (72.1 kg)    GEN: Well nourished, well developed in no acute distress.  NECK: No JVD; No carotid bruits CARDIAC: IRRR, no murmurs, rubs, gallops RESPIRATORY:  Clear to auscultation without rales, wheezing or rhonchi  ABDOMEN: Soft, non-tender, non-distended EXTREMITIES:  mild edema in ankles only.    ASSESSMENT AND PLAN: .    Longstanding persistent atrial fibrillation/flutter He has been in atrial flutter since at least 2020, asymptomatic.  Initial plans were for  cardioversion but he reports missing a dose of Xarelto this Sunday.  Per chart review he has had frequent issues of compliancy. Rates are controlled today between 70-90.  Additionally with history of frequent falls. Defer cardioversion plans to cardiologist.  As above.  I think he has multiple concerning features that would make me hesitant to cardiovert.  Would need to demonstrate better compliancy with his medications but still have concerns that he may not maintain sinus rhythm given chronicity. Continue Xarelto 20 mg and Toprol -XL 50 mg.  CAD Hyperlipidemia -Redo CABG 2012 Stable disease without any anginal complaints or equivalents. Continue with atorvastatin  80 mg, Toprol -XL 50 mg.  No aspirin  with DOAC. LDL decently controlled at 61 05/2023.  Ideally goal would be less than 55 but with advanced age will defer to his primary cardiologist on how aggressive they want to be with his cholesterol goals.  This is within reason now.  ICM with reduced EF LBBB - 06/2023 EF 35 to 40% Appears to be decently compensated and euvolemic, previously has had issues of dehydration so GDMT has been reduced. Jardiance  and Entresto  stopped last visit.  Continue with spironolactone  12.5 mg, losartan 12.5 mg, Toprol -XL 50 mg. Has underlying left bundle branch block with QRS around 130 not sure if he would be a candidate for CRT.  Hypertension Well-controlled.  Continue in the context above.  Aortic dilatation 41 mm dilatation at the aortic root.  Monitor annually on echocardiogram.  Diabetes A1c 6.8%  Falls Continue to assess this as he is on long-term anticoagulation.  Most recent fall related to trip but has had balance issues in the past  Dysphagia moderate oropharyngeal dysphagia and followed by SLP along with otolaryngology.    Dispo: Follow-up with Dr. Elmira in 4 to 6 weeks to evaluate candidacy for cardioversion at that time.  Signed, Thom LITTIE Sluder, PA-C

## 2024-01-06 ENCOUNTER — Encounter: Payer: Self-pay | Admitting: Cardiology

## 2024-01-06 ENCOUNTER — Ambulatory Visit: Attending: Cardiology | Admitting: Cardiology

## 2024-01-06 VITALS — BP 129/66 | HR 76 | Ht 77.0 in | Wt 165.0 lb

## 2024-01-06 DIAGNOSIS — I5020 Unspecified systolic (congestive) heart failure: Secondary | ICD-10-CM | POA: Insufficient documentation

## 2024-01-06 DIAGNOSIS — I4811 Longstanding persistent atrial fibrillation: Secondary | ICD-10-CM | POA: Diagnosis not present

## 2024-01-06 DIAGNOSIS — I10 Essential (primary) hypertension: Secondary | ICD-10-CM | POA: Diagnosis not present

## 2024-01-06 DIAGNOSIS — I25810 Atherosclerosis of coronary artery bypass graft(s) without angina pectoris: Secondary | ICD-10-CM | POA: Insufficient documentation

## 2024-01-06 NOTE — Patient Instructions (Signed)
 Medication Instructions:   Your physician recommends that you continue on your current medications as directed. Please refer to the Current Medication list given to you today.   *If you need a refill on your cardiac medications before your next appointment, please call your pharmacy*    Lab Work: NONE ORDERED  TODAY    If you have labs (blood work) drawn today and your tests are completely normal, you will receive your results only by: MyChart Message (if you have MyChart) OR A paper copy in the mail If you have any lab test that is abnormal or we need to change your treatment, we will call you to review the results.    Testing/Procedures:    Follow-Up: At [facility], you and your health needs are our priority.  As part of our continuing mission to provide you with exceptional heart care, our providers are all part of one team.  This team includes your primary Cardiologist (physician) and Advanced Practice Providers or APPs (Physician Assistants and Nurse Practitioners) who all work together to provide you with the care you need, when you need it.  Your next appointment:      4-6  week(s)   Provider:    Newman JINNY Lawrence, MD  ONLY!!  PER HALEY  ( MESSAGE THE NURSE IF SCHEUDLING ISSUES )    We recommend signing up for the patient portal called MyChart.  Sign up information is provided on this After Visit Summary.  MyChart is used to connect with patients for Virtual Visits (Telemedicine).  Patients are able to view lab/test results, encounter notes, upcoming appointments, etc.  Non-urgent messages can be sent to your provider as well.   To learn more about what you can do with MyChart, go to [email or web address].   Other Instructions

## 2024-02-16 ENCOUNTER — Inpatient Hospital Stay (HOSPITAL_COMMUNITY)
Admission: EM | Admit: 2024-02-16 | Discharge: 2024-02-23 | DRG: 871 | Disposition: A | Discharge status: Skilled Nursing Facility | Attending: Family Medicine | Admitting: Family Medicine

## 2024-02-16 DIAGNOSIS — I5022 Chronic systolic (congestive) heart failure: Secondary | ICD-10-CM | POA: Diagnosis present

## 2024-02-16 DIAGNOSIS — E782 Mixed hyperlipidemia: Secondary | ICD-10-CM | POA: Diagnosis present

## 2024-02-16 DIAGNOSIS — Z9189 Other specified personal risk factors, not elsewhere classified: Secondary | ICD-10-CM | POA: Diagnosis not present

## 2024-02-16 DIAGNOSIS — F0390 Unspecified dementia without behavioral disturbance: Secondary | ICD-10-CM | POA: Diagnosis present

## 2024-02-16 DIAGNOSIS — Z515 Encounter for palliative care: Secondary | ICD-10-CM | POA: Diagnosis not present

## 2024-02-16 DIAGNOSIS — L89153 Pressure ulcer of sacral region, stage 3: Secondary | ICD-10-CM | POA: Diagnosis present

## 2024-02-16 DIAGNOSIS — E872 Acidosis, unspecified: Secondary | ICD-10-CM | POA: Diagnosis present

## 2024-02-16 DIAGNOSIS — G9341 Metabolic encephalopathy: Secondary | ICD-10-CM | POA: Diagnosis present

## 2024-02-16 DIAGNOSIS — R296 Repeated falls: Secondary | ICD-10-CM | POA: Diagnosis not present

## 2024-02-16 DIAGNOSIS — Z681 Body mass index (BMI) 19 or less, adult: Secondary | ICD-10-CM | POA: Diagnosis not present

## 2024-02-16 DIAGNOSIS — E860 Dehydration: Secondary | ICD-10-CM | POA: Diagnosis present

## 2024-02-16 DIAGNOSIS — Z7901 Long term (current) use of anticoagulants: Secondary | ICD-10-CM | POA: Diagnosis not present

## 2024-02-16 DIAGNOSIS — Z66 Do not resuscitate: Secondary | ICD-10-CM | POA: Diagnosis not present

## 2024-02-16 DIAGNOSIS — J9601 Acute respiratory failure with hypoxia: Secondary | ICD-10-CM | POA: Diagnosis present

## 2024-02-16 DIAGNOSIS — A419 Sepsis, unspecified organism: Principal | ICD-10-CM

## 2024-02-16 DIAGNOSIS — R531 Weakness: Secondary | ICD-10-CM

## 2024-02-16 DIAGNOSIS — R1310 Dysphagia, unspecified: Secondary | ICD-10-CM

## 2024-02-16 DIAGNOSIS — I4819 Other persistent atrial fibrillation: Secondary | ICD-10-CM | POA: Diagnosis present

## 2024-02-16 DIAGNOSIS — E43 Unspecified severe protein-calorie malnutrition: Secondary | ICD-10-CM | POA: Diagnosis present

## 2024-02-16 DIAGNOSIS — Z7189 Other specified counseling: Secondary | ICD-10-CM | POA: Diagnosis not present

## 2024-02-16 DIAGNOSIS — L89893 Pressure ulcer of other site, stage 3: Secondary | ICD-10-CM | POA: Diagnosis present

## 2024-02-16 DIAGNOSIS — I5020 Unspecified systolic (congestive) heart failure: Secondary | ICD-10-CM

## 2024-02-16 DIAGNOSIS — R053 Chronic cough: Secondary | ICD-10-CM | POA: Diagnosis present

## 2024-02-16 DIAGNOSIS — I10 Essential (primary) hypertension: Secondary | ICD-10-CM

## 2024-02-16 DIAGNOSIS — L89223 Pressure ulcer of left hip, stage 3: Secondary | ICD-10-CM | POA: Diagnosis present

## 2024-02-16 DIAGNOSIS — Z1152 Encounter for screening for COVID-19: Secondary | ICD-10-CM | POA: Diagnosis not present

## 2024-02-16 DIAGNOSIS — J189 Pneumonia, unspecified organism: Secondary | ICD-10-CM | POA: Diagnosis present

## 2024-02-16 DIAGNOSIS — Z951 Presence of aortocoronary bypass graft: Secondary | ICD-10-CM

## 2024-02-16 DIAGNOSIS — R6520 Severe sepsis without septic shock: Secondary | ICD-10-CM | POA: Diagnosis present

## 2024-02-16 DIAGNOSIS — I2510 Atherosclerotic heart disease of native coronary artery without angina pectoris: Secondary | ICD-10-CM | POA: Diagnosis present

## 2024-02-16 DIAGNOSIS — I110 Hypertensive heart disease with heart failure: Secondary | ICD-10-CM | POA: Diagnosis present

## 2024-02-16 DIAGNOSIS — I4892 Unspecified atrial flutter: Secondary | ICD-10-CM | POA: Diagnosis present

## 2024-02-16 DIAGNOSIS — R1312 Dysphagia, oropharyngeal phase: Secondary | ICD-10-CM | POA: Diagnosis present

## 2024-02-16 DIAGNOSIS — R627 Adult failure to thrive: Secondary | ICD-10-CM | POA: Diagnosis present

## 2024-02-16 DIAGNOSIS — Z862 Personal history of diseases of the blood and blood-forming organs and certain disorders involving the immune mechanism: Secondary | ICD-10-CM

## 2024-02-16 DIAGNOSIS — I480 Paroxysmal atrial fibrillation: Secondary | ICD-10-CM

## 2024-02-16 DIAGNOSIS — Z8659 Personal history of other mental and behavioral disorders: Secondary | ICD-10-CM

## 2024-02-16 DIAGNOSIS — M21331 Wrist drop, right wrist: Secondary | ICD-10-CM | POA: Diagnosis present

## 2024-02-16 DIAGNOSIS — L8990 Pressure ulcer of unspecified site, unspecified stage: Secondary | ICD-10-CM | POA: Insufficient documentation

## 2024-02-16 DIAGNOSIS — Z9181 History of falling: Secondary | ICD-10-CM

## 2024-02-16 DIAGNOSIS — Z880 Allergy status to penicillin: Secondary | ICD-10-CM

## 2024-02-16 DIAGNOSIS — L89212 Pressure ulcer of right hip, stage 2: Secondary | ICD-10-CM | POA: Diagnosis present

## 2024-02-16 DIAGNOSIS — D72820 Lymphocytosis (symptomatic): Secondary | ICD-10-CM | POA: Diagnosis present

## 2024-02-16 HISTORY — DX: Hyperlipidemia, unspecified: E78.5

## 2024-02-16 HISTORY — DX: Essential (primary) hypertension: I10

## 2024-02-16 HISTORY — DX: Unspecified dementia, unspecified severity, without behavioral disturbance, psychotic disturbance, mood disturbance, and anxiety: F03.90

## 2024-02-16 LAB — CBC WITH DIFFERENTIAL/PLATELET
Abs Immature Granulocytes: 0.14 K/uL — ABNORMAL HIGH (ref 0.00–0.07)
Basophils Absolute: 0.1 K/uL (ref 0.0–0.1)
Basophils Relative: 0 %
Eosinophils Absolute: 0 K/uL (ref 0.0–0.5)
Eosinophils Relative: 0 %
HCT: 37.2 % — ABNORMAL LOW (ref 39.0–52.0)
Hemoglobin: 11.7 g/dL — ABNORMAL LOW (ref 13.0–17.0)
Immature Granulocytes: 1 %
Lymphocytes Relative: 2 %
Lymphs Abs: 0.6 K/uL — ABNORMAL LOW (ref 0.7–4.0)
MCH: 32.7 pg (ref 26.0–34.0)
MCHC: 31.5 g/dL (ref 30.0–36.0)
MCV: 103.9 fL — ABNORMAL HIGH (ref 80.0–100.0)
Monocytes Absolute: 0.8 K/uL (ref 0.1–1.0)
Monocytes Relative: 3 %
Neutro Abs: 25.9 K/uL — ABNORMAL HIGH (ref 1.7–7.7)
Neutrophils Relative %: 94 %
Platelets: 299 K/uL (ref 150–400)
RBC: 3.58 MIL/uL — ABNORMAL LOW (ref 4.22–5.81)
RDW: 15.1 % (ref 11.5–15.5)
WBC: 27.5 K/uL — ABNORMAL HIGH (ref 4.0–10.5)
nRBC: 0 % (ref 0.0–0.2)

## 2024-02-16 LAB — COMPREHENSIVE METABOLIC PANEL WITH GFR
ALT: 24 U/L (ref 0–44)
AST: 36 U/L (ref 15–41)
Albumin: 3 g/dL — ABNORMAL LOW (ref 3.5–5.0)
Alkaline Phosphatase: 180 U/L — ABNORMAL HIGH (ref 38–126)
Anion gap: 11 (ref 5–15)
BUN: 23 mg/dL (ref 8–23)
CO2: 27 mmol/L (ref 22–32)
Calcium: 8.7 mg/dL — ABNORMAL LOW (ref 8.9–10.3)
Chloride: 104 mmol/L (ref 98–111)
Creatinine, Ser: 1.06 mg/dL (ref 0.61–1.24)
GFR, Estimated: >60 mL/min
Glucose, Bld: 227 mg/dL — ABNORMAL HIGH (ref 70–99)
Potassium: 4.4 mmol/L (ref 3.5–5.1)
Sodium: 142 mmol/L (ref 135–145)
Total Bilirubin: 1.3 mg/dL — ABNORMAL HIGH (ref 0.0–1.2)
Total Protein: 6.8 g/dL (ref 6.5–8.1)

## 2024-02-16 LAB — I-STAT CG4 LACTIC ACID, ED
Lactic Acid, Venous: 4.7 mmol/L (ref 0.5–1.9)
Lactic Acid, Venous: 2.3 mmol/L (ref 0.5–1.9)

## 2024-02-16 LAB — PROTIME-INR
INR: 1.2 (ref 0.8–1.2)
Prothrombin Time: 15.9 s — ABNORMAL HIGH (ref 11.4–15.2)

## 2024-02-16 LAB — PHOSPHORUS: Phosphorus: 2.3 mg/dL — ABNORMAL LOW (ref 2.5–4.6)

## 2024-02-16 LAB — RESP PANEL BY RT-PCR (RSV, FLU A&B, COVID)  RVPGX2
Influenza A by PCR: NEGATIVE
Influenza B by PCR: NEGATIVE
Resp Syncytial Virus by PCR: NEGATIVE
SARS Coronavirus 2 by RT PCR: NEGATIVE

## 2024-02-16 LAB — MAGNESIUM: Magnesium: 1.5 mg/dL — ABNORMAL LOW (ref 1.7–2.4)

## 2024-02-16 LAB — MRSA NEXT GEN BY PCR, NASAL: MRSA by PCR Next Gen: NOT DETECTED

## 2024-02-16 MED ADMIN — Rivaroxaban Tab 20 MG: 20 mg | ORAL | NDC 50458057901

## 2024-02-16 MED ADMIN — Sodium Chloride IV Soln 0.9%: 1000 mL | INTRAVENOUS | NDC 00264580200

## 2024-02-16 MED ADMIN — CEFTRIAXONE 2 G/100 ML IVPB MIXTURE: 2 g | INTRAVENOUS | NDC 00781320990

## 2024-02-16 MED ADMIN — AZITHROMYCIN 500 MG IVPB: 500 mg | INTRAVENOUS | NDC 70436001982

## 2024-02-16 NOTE — H&P (Addendum)
 " History and Physical  Shawn [name] FMW:[id number] DOB: 12/09/1928 DOA: 02/16/2024  PCP: No primary care provider on file.   Chief Complaint: Generalized weakness, falls, altered mental status  HPI: Shawn [name] is a 88 y.o. male with medical history significant for dementia, hypertension, hyperlipidemia, dysphagia and paroxysmal A-fib on Xarelto  who presented to the ED for evaluation of generalized weakness, frequent falls and altered mental status. History obtained from caretaker at bedside and daughter over the phone. They report that patient was around multiple children around Christmas and between Thursday and Friday, he had multiple falls due to weakness.  He sustained a mild bruise on his left flank after his last fall Friday.  Today, after lunch, he informed his caretaker that he was not feeling well and wanted to be taken to the hospital. He has a chronic cough but he has slightly increased the last few days with increased mucus production. He has also had poor appetite and seems confused and agitated compared to his baseline. He is usually oriented to self, person, place and sometimes time but does suffer from memory loss.  Patient alert but confused during encounter, follows commands but unable to answer any questions.  Off note, patient has a second chart marked for merge: MRN: [id number]   ED Course: Initial vitals show temp 98.1, RR 16-28, HR 90-130s, SBP 110s, SpO2 97% on 4 L. Initial labs significant for WBC 27.5, Hgb 11.7, glucose 227, alk phos 180, lactic acid 4.7, negative flu/RSV/influenza test, UA pending. EKG shows sinus tach with atypical RBBB. CXR shows bibasilar pneumonia. Pt received IV NS 1 L bolus x 2, IV Rocephin  and IV azithromycin . TRH was consulted for admission.   Review of Systems: Please see HPI for pertinent positives and negatives. A complete 10 system review of systems are otherwise negative.  Past Medical History:  Diagnosis Date   Dementia (HCC)     Hyperlipidemia    Hypertension    Past Surgical History:  Procedure Laterality Date   JOINT REPLACEMENT     Social History:  has no history on file for tobacco use, alcohol use, and drug use.  Allergies[1]  History reviewed. No pertinent family history.   Prior to Admission medications  Not on File    Physical Exam: BP (!) 114/55 (BP Location: Left Arm)   Pulse (!) 130   Temp 99.1 F (37.3 C) (Oral)   Resp 16   SpO2 97%  General: Pleasant, acutely ill and weak appearing elderly man laying in bed. No acute distress. HEENT: [place]/AT. Anicteric sclera.  Dry mucous membrane CV: Tachycardic. Regular rate rhythm. No murmurs, rubs, or gallops. No LE edema Pulmonary: On 3 LNC. Lungs CTAB. Normal effort. Mild rhonchi to bases. No wheezing or rales. Abdominal: Soft, nontender, nondistended. Normal bowel sounds. Extremities: Palpable radial and DP pulses. Normal ROM. Skin: Warm and dry. Healed bruise on left flank. Multiple healed bruises/scratches on legs and arms. Decreased skin turgor. Neuro: Alert but confused and restless. Eyes open, follows simple commands. Does not answer any questions. Generalized weakness. Moves all extremities. Normal sensation to light touch. No focal deficit.          Labs on Admission:  Basic Metabolic Panel: Recent Labs  Lab 02/16/24 1745  NA 142  K 4.4  CL 104  CO2 27  GLUCOSE 227*  BUN 23  CREATININE 1.06  CALCIUM  8.7*   Liver Function Tests: Recent Labs  Lab 02/16/24 1745  AST 36  ALT 24  ALKPHOS 180*  BILITOT 1.3*  PROT 6.8  ALBUMIN 3.0*   No results for input(s): LIPASE, AMYLASE in the last 168 hours. No results for input(s): AMMONIA in the last 168 hours. CBC: Recent Labs  Lab 02/16/24 1745  WBC 27.5*  NEUTROABS 25.9*  HGB 11.7*  HCT 37.2*  MCV 103.9*  PLT 299   Cardiac Enzymes: No results for input(s): CKTOTAL, CKMB, CKMBINDEX, TROPONINI in the last 168 hours. BNP (last 3 results) No results for  input(s): BNP in the last 8760 hours.  ProBNP (last 3 results) No results for input(s): PROBNP in the last 8760 hours.  CBG: No results for input(s): GLUCAP in the last 168 hours.  Radiological Exams on Admission: DG Chest Port 1 View if patient is in a treatment room. Result Date: 02/16/2024 CLINICAL DATA:  Sepsis. EXAM: PORTABLE CHEST 1 VIEW COMPARISON:  None Available. FINDINGS: Bibasilar opacities concerning for pneumonia. No pleural effusion. No detectable pneumothorax. The cardiac silhouette is within limits. Median sternotomy wires. No acute osseous pathology. IMPRESSION: Bibasilar pneumonia. Electronically Signed   By: Vanetta Chou M.D.   On: 02/16/2024 18:47   Assessment/Plan Shawn [name] is a 88 y.o. male with medical history significant for dementia, hypertension, hyperlipidemia and paroxysmal A-fib on Xarelto  who presented to the ED for evaluation of generalized weakness, frequent falls and altered mental status and admitted for severe sepsis.  # Severe sepsis # Community-acquired pneumonia - Pt presented with 4 to 5 days of generalized weakness, recurrent falls, productive cough and poor appetite - Chest imaging shows bibasilar pneumonia - Met severe sepsis criteria with leukocytosis, tachycardia, tachypnea, lactic acidosis, altered mental status and evidence of pneumonia - Continue IV Rocephin  and oral azithromycin  - Start IV NS 1 L bolus followed by 100 cc/h infusion and as needed Robitussin for cough - Follow-up blood culture, sputum culture and urinalysis - Check MRSA screen, full RVP, procalcitonin, urinary Legionella and strep pneumo - Trend CBC, fever curve  # Acute metabolic encephalopathy # Hx of dementia - Per family, his dementia is mild and patient is usually alert and oriented x 3, has some short-term memory loss but he is currently more confused, unable to have full conversation and seemed more agitated than his baseline.  - AMS likely secondary to  acute respiratory infection, will anticipate improvement after IV hydration and treatment of pneumonia - Delirium precautions  # Acute hypoxic respiratory failure - Patient found to be hypoxic to the 80s while on room air, requiring up to 4 L [place] in the setting of acute pneumonia - Continue supplemental O2, wean as able - IS, flutter valve  # Hypomagnesemia # Hypophosphatemia - After admission, mag found to be low at 1.5 and phosphorus 2.3, likely contributing to his weakness - Repleting with IV mag 2 g and IV potassium phosphate  30 mmol - Follow-up repeat magnesium  and phosphorus  # Paroxysmal A-fib - EKG shows sinus tach with incomplete RBBB, HR elevated to 90-130s in the setting of acute pneumonia and dehydration - Continue Toprol  XL and Xarelto  - Telemetry  # HTN - BP stable with SBP in the 110s - Hold Toprol -XL, spironolactone  and losartan  today and resume tomorrow  # HLD - Continue atorvastatin   # Generalized weakness # Recurrent falls - This is in the setting of his acute respiratory infection - PT/OT eval and treat - Fall precautions  # ADDENDUM 12:56 AM Patient's new chart shows he has a history of CAD s/p CABG, HFrEF, lymphocytosis and dysphagia  # HFrEF - Last TTE  in 07/17/2023 shows EF 35-40%, LV RWM abnormality, mild asymmetric LVH and mildly dilated LA and RA. - Patient remains dry on exam in the setting of recent poor p.o. intake - Continue IV hydration to clear lactate but reduce fluid rate from 150 cc/h to 100 cc/h with an end time to decrease risk of fluid overloaded state - Continue GDMT with Toprol  XL, spironolactone  and losartan   # Dysphagia - Thought to be oropharyngeal dysphagia that has been going on for more than a year - Has a history of coughing while eating and hoarse voice - SLP consulted for swallow evaluation  # Lymphocytosis - This is a relatively new problem in the last 2 to 3 months - Last CBC in July showed WBC of 7.6, white count  elevated to 27.5 on admission - Continue to monitor white count with treatment of pneumonia  # CAD s/p CABG - Continue atorvastatin    DVT prophylaxis: Xarelto     Code Status: Full Code  Consults called: None  Family Communication: Discussed results/findings and plan for admission with caretaker at bedside and daughter over the phone  Severity of Illness: The appropriate patient status for this patient is INPATIENT. Inpatient status is judged to be reasonable and necessary in order to provide the required intensity of service to ensure the patient's safety. The patient's presenting symptoms, physical exam findings, and initial radiographic and laboratory data in the context of their chronic comorbidities is felt to place them at high risk for further clinical deterioration. Furthermore, it is not anticipated that the patient will be medically stable for discharge from the hospital within 2 midnights of admission.   * I certify that at the point of admission it is my clinical judgment that the patient will require inpatient hospital care spanning beyond 2 midnights from the point of admission due to high intensity of service, high risk for further deterioration and high frequency of surveillance required.*  Level of care: Progressive   I personally spent a total of 85 minutes in the care of the patient today including preparing to see the patient, getting/reviewing separately obtained history, performing a medically appropriate exam/evaluation, placing orders, documenting clinical information in the EHR, communicating results, and extensively reviewing patient's other chart that is marked for merge.Shawn Lou Claretta CHRISTELLA, MD 02/16/2024, 8:27 PM [facility] Pager: [phone] Isaiah [phone]:10   If 7PM-7AM, please contact night-coverage [email or web address] Password [email or web address]     [1] No Known Allergies  "

## 2024-02-16 NOTE — ED Provider Notes (Signed)
 " [place] [facility]ERG[facility] DEPARTMENT AT [facility] Provider Note   CSN: [id number] Arrival date & time: 02/16/24  8362     Patient presents with: Fall and Weakness   Shawn [name] is a 88 y.o. male.   Patient has a history of dementia.  He has been coughing more lately and weak and falling.  Patient arrived hypoxic.  He has a caregiver and is significantly demented.  The history is provided by a caregiver. No language interpreter was used.  Weakness Severity:  Moderate Onset quality:  Sudden Timing:  Constant Progression:  Worsening Chronicity:  New Context: not alcohol use   Relieved by:  Nothing Worsened by:  Nothing Ineffective treatments:  None tried Associated symptoms: cough   Associated symptoms: no abdominal pain, no chest pain, no diarrhea, no frequency, no headaches and no seizures        Prior to Admission medications  Not on File    Allergies: Patient has no known allergies.    Review of Systems  Constitutional:  Negative for appetite change and fatigue.  HENT:  Negative for congestion, ear discharge and sinus pressure.   Eyes:  Negative for discharge.  Respiratory:  Positive for cough.   Cardiovascular:  Negative for chest pain.  Gastrointestinal:  Negative for abdominal pain and diarrhea.  Genitourinary:  Negative for frequency and hematuria.  Musculoskeletal:  Negative for back pain.  Skin:  Negative for rash.  Neurological:  Positive for weakness. Negative for seizures and headaches.  Psychiatric/Behavioral:  Negative for hallucinations.     Updated Vital Signs BP (!) 114/55 (BP Location: Left Arm)   Pulse (!) 130   Temp 99.1 F (37.3 C) (Oral)   Resp 16   SpO2 97%   Physical Exam Vitals and nursing note reviewed.  Constitutional:      Appearance: He is well-developed.  HENT:     Head: Normocephalic.     Nose: Nose normal.  Eyes:     General: No scleral icterus.    Conjunctiva/sclera: Conjunctivae normal.  Neck:      Thyroid : No thyromegaly.  Cardiovascular:     Rate and Rhythm: Regular rhythm. Tachycardia present.     Heart sounds: No murmur heard.    No friction rub. No gallop.  Pulmonary:     Breath sounds: No stridor. No wheezing or rales.  Chest:     Chest wall: No tenderness.  Abdominal:     General: There is no distension.     Tenderness: There is no abdominal tenderness. There is no rebound.  Musculoskeletal:        General: Normal range of motion.     Cervical back: Neck supple.  Lymphadenopathy:     Cervical: No cervical adenopathy.  Skin:    Findings: No erythema or rash.  Neurological:     Mental Status: He is alert and oriented to person, place, and time.     Motor: No abnormal muscle tone.     Coordination: Coordination normal.  Psychiatric:        Behavior: Behavior normal.     (all labs ordered are listed, but only abnormal results are displayed) Labs Reviewed  CBC WITH DIFFERENTIAL/PLATELET - Abnormal; Notable for the following components:      Result Value   WBC 27.5 (*)    RBC 3.58 (*)    Hemoglobin 11.7 (*)    HCT 37.2 (*)    MCV 103.9 (*)    Neutro Abs 25.9 (*)  Lymphs Abs 0.6 (*)    Abs Immature Granulocytes 0.14 (*)    All other components within normal limits  COMPREHENSIVE METABOLIC PANEL WITH GFR - Abnormal; Notable for the following components:   Glucose, Bld 227 (*)    Calcium  8.7 (*)    Albumin 3.0 (*)    Alkaline Phosphatase 180 (*)    Total Bilirubin 1.3 (*)    All other components within normal limits  PROTIME-INR - Abnormal; Notable for the following components:   Prothrombin Time 15.9 (*)    All other components within normal limits  I-STAT CG4 LACTIC ACID, ED - Abnormal; Notable for the following components:   Lactic Acid, Venous 4.7 (*)    All other components within normal limits  RESP PANEL BY RT-PCR (RSV, FLU A&B, COVID)  RVPGX2  CULTURE, BLOOD (ROUTINE X 2)  CULTURE, BLOOD (ROUTINE X 2)  URINALYSIS, W/ REFLEX TO CULTURE (INFECTION  SUSPECTED)  I-STAT CG4 LACTIC ACID, ED    EKG: None  Radiology: DG Chest Port 1 View if patient is in a treatment room. Result Date: 02/16/2024 CLINICAL DATA:  Sepsis. EXAM: PORTABLE CHEST 1 VIEW COMPARISON:  None Available. FINDINGS: Bibasilar opacities concerning for pneumonia. No pleural effusion. No detectable pneumothorax. The cardiac silhouette is within limits. Median sternotomy wires. No acute osseous pathology. IMPRESSION: Bibasilar pneumonia. Electronically Signed   By: Vanetta Chou M.D.   On: 02/16/2024 18:47     Procedures   Medications Ordered in the ED  azithromycin  (ZITHROMAX ) 500 mg in sodium chloride  0.9 % 250 mL IVPB (500 mg Intravenous New Bag/Given 02/16/24 1834)  sodium chloride  0.9 % bolus 1,000 mL (has no administration in time range)  sodium chloride  0.9 % bolus 1,000 mL (1,000 mLs Intravenous New Bag/Given 02/16/24 1815)  cefTRIAXone  (ROCEPHIN ) 2 g in sodium chloride  0.9 % 100 mL IVPB (2 g Intravenous New Bag/Given 02/16/24 1815)     CRITICAL CARE Performed by: Fairy Sermon Total critical care time: 45 minutes Critical care time was exclusive of separately billable procedures and treating other patients. Critical care was necessary to treat or prevent imminent or life-threatening deterioration. Critical care was time spent personally by me on the following activities: development of treatment plan with patient and/or surrogate as well as nursing, discussions with consultants, evaluation of patient's response to treatment, examination of patient, obtaining history from patient or surrogate, ordering and performing treatments and interventions, ordering and review of laboratory studies, ordering and review of radiographic studies, pulse oximetry and re-evaluation of patient's condition.                                Medical Decision Making Amount and/or Complexity of Data Reviewed Labs: ordered. Radiology: ordered. ECG/medicine tests:  ordered.  Risk Decision regarding hospitalization.   Patient with bilateral pneumonia and sepsis.  He will be admitted to medicine     Final diagnoses:  Community acquired pneumonia, unspecified laterality    ED Discharge Orders     None          Sermon Fairy, MD 02/16/24 1903  "

## 2024-02-16 NOTE — ED Triage Notes (Signed)
 Patient presents from home in which he receives visits form home health and family. For the past week, he has had decreased mobility and multiple falls. EMS noted wounds on his arms and legs. Patient has been laying on the floor after each fall for an unknown amount of time. He has also had intermittent diarrhea. EMS is concerned for a possible UTI. EMS administered a 500 ml fluid bolus which increased alertness.     HX: Hospice patient, dementia    EMS vitals: 130s HR 138/92 BP 167 CBG 2L O2 placed by EMS. They were unable to obtain an O2 sat level.

## 2024-02-17 DIAGNOSIS — J189 Pneumonia, unspecified organism: Secondary | ICD-10-CM | POA: Diagnosis not present

## 2024-02-17 DIAGNOSIS — I5020 Unspecified systolic (congestive) heart failure: Secondary | ICD-10-CM

## 2024-02-17 DIAGNOSIS — Z862 Personal history of diseases of the blood and blood-forming organs and certain disorders involving the immune mechanism: Secondary | ICD-10-CM

## 2024-02-17 DIAGNOSIS — G9341 Metabolic encephalopathy: Secondary | ICD-10-CM | POA: Diagnosis not present

## 2024-02-17 DIAGNOSIS — R6520 Severe sepsis without septic shock: Secondary | ICD-10-CM | POA: Diagnosis not present

## 2024-02-17 DIAGNOSIS — A419 Sepsis, unspecified organism: Secondary | ICD-10-CM | POA: Diagnosis not present

## 2024-02-17 DIAGNOSIS — J9601 Acute respiratory failure with hypoxia: Secondary | ICD-10-CM | POA: Diagnosis not present

## 2024-02-17 DIAGNOSIS — R1310 Dysphagia, unspecified: Secondary | ICD-10-CM

## 2024-02-17 DIAGNOSIS — I480 Paroxysmal atrial fibrillation: Secondary | ICD-10-CM | POA: Diagnosis not present

## 2024-02-17 MED ADMIN — Azithromycin Tab 500 MG: 500 mg | ORAL | NDC 65862064230

## 2024-02-17 MED ADMIN — Atorvastatin Calcium Tab 40 MG (Base Equivalent): 80 mg | ORAL | NDC 50268009511

## 2024-02-17 MED ADMIN — Rivaroxaban Tab 20 MG: 20 mg | ORAL | NDC 50458057901

## 2024-02-17 MED ADMIN — Losartan Potassium Tab 25 MG: 12.5 mg | ORAL | NDC 00904704761

## 2024-02-17 MED ADMIN — Metoprolol Succinate Tab ER 24HR 50 MG (Tartrate Equiv): 50 mg | ORAL | NDC 50268054111

## 2024-02-17 MED ADMIN — CEFTRIAXONE  1 GM IVPB MIXTURE: 1 g | INTRAVENOUS | NDC 60505614800

## 2024-02-17 MED ADMIN — Spironolactone Tab 25 MG: 12.5 mg | ORAL | NDC 99999010000

## 2024-02-17 MED ADMIN — POTASSIUM PHOSPHATE HIGH DOSE (MMOL) IVPB: 30 mmol | INTRAVENOUS | NDC 65219005409

## 2024-02-17 MED ADMIN — Magnesium Sulfate IV Soln 2 GM/50ML (40 MG/ML): 2 g | INTRAVENOUS | NDC 25021061281

## 2024-02-17 MED ADMIN — Azithromycin Tab 500 MG: 500 mg | ORAL | NDC 60687074211

## 2024-02-17 MED ADMIN — Magnesium Sulfate IV Soln 2 GM/50ML (40 MG/ML): 2 g | INTRAVENOUS | NDC 83634050041

## 2024-02-17 NOTE — Progress Notes (Signed)
 MEWS Progress Note  Patient Details Name: Shawn [name] MRN: [id number] DOB: 02-18-1935 Today's Date: 02/17/2024   MEWS Flowsheet Documentation:  Assess: MEWS Score Temp: 98 F (36.7 C) BP: 98/73 MAP (mmHg): 81 Pulse Rate: (!) 117 ECG Heart Rate: 81 Resp: 16 Level of Consciousness: Alert SpO2: 100 % O2 Device: High Flow Nasal Cannula Patient Activity (if Appropriate): In bed O2 Flow Rate (L/min): 7 L/min Assess: MEWS Score MEWS Temp: 0 MEWS Systolic: 1 MEWS Pulse: 2 MEWS RR: 0 MEWS LOC: 0 MEWS Score: 3 MEWS Score Color: Yellow Assess: SIRS CRITERIA SIRS Temperature : 0 SIRS Respirations : 0 SIRS Pulse: 1 SIRS WBC: 0 SIRS Score Sum : 1 SIRS Temperature : 0 SIRS Pulse: 1 SIRS Respirations : 0 SIRS WBC: 0 SIRS Score Sum : 1 Assess: if the MEWS score is Yellow or Red Were vital signs accurate and taken at a resting state?: Yes Does the patient meet 2 or more of the SIRS criteria?: No MEWS guidelines implemented : Yes, yellow Treat MEWS Interventions: Considered administering scheduled or prn medications/treatments as ordered Take Vital Signs Increase Vital Sign Frequency : Yellow: Q2hr x1, continue Q4hrs until patient remains green for 12hrs Escalate MEWS: Escalate: Yellow: Discuss with charge nurse and consider notifying provider and/or RRT Notify: Charge Nurse/RN Name of Charge Nurse/RN Notified: Abby, Magazine Features Editor Name/Title:  (No need) Notify: Rapid Response Name of Rapid Response RN Notified:  (No need)      Shawn [name] 02/17/2024, 7:06 PM

## 2024-02-17 NOTE — ED Notes (Signed)
 Breakfast tray given to patient.

## 2024-02-17 NOTE — ED Notes (Signed)
 Complete bed and brief change/clean up completed.

## 2024-02-17 NOTE — Progress Notes (Signed)
 OT Cancellation Note  Patient Details Name: CYNCERE SONTAG MRN: [id number] DOB: Oct 01, 1935   Cancelled Treatment:    Reason Eval/Treat Not Completed: Patient not medically ready Nursing had spoken to PT earlier and requested that therapy evaluations be held today as pt is not ready to participate. See PT cancel note for details.   Leita Howell, OTR/L,CBIS  Supplemental OT - [facility] and [facility] Secure Chat Preferred   02/17/2024, 11:49 AM

## 2024-02-17 NOTE — Progress Notes (Signed)
 Pt A/O X 1, Hx of dementia, unable to complete admission history.

## 2024-02-17 NOTE — Progress Notes (Signed)
 PT Cancellation Note  Patient Details Name: Shawn [name] MRN: [id number] DOB: 06/25/1935   Cancelled Treatment:    Reason Eval/Treat Not Completed: Patient not medically ready. Per RN, pt restless all night, confused and agitated, heavily incontinent and now sleeping on HFNC. RN asked PT to hold evaluation for now, deemed pt not appropriate currently.  Isaiah DEL. Tavaria Mackins, PT, DPT  [employer] 02/17/2024, 9:42 AM

## 2024-02-17 NOTE — Progress Notes (Signed)
 " Progress Note   Patient: Shawn [name] FMW:[id number] DOB: [birth year] DOA: 02/16/2024     1 DOS: the patient was seen and examined on 02/17/2024   Brief hospital course: 88 year old male PMH including dementia, dysphagia, multiple falls at home, apparently on hospice, presented 12/30 to ED with history of multiple falls, generalized weakness, altered mental status.  Admitted for acute hypoxic respiratory failure, severe sepsis secondary to pneumonia, acute metabolic encephalopathy superimposed on dementia, electrolyte abnormalities.  Consultants None  Procedures/Events 12/30 admission for pneumonia, sepsis, hypoxia  Assessment and Plan: Severe sepsis secondary to  Bilateral pneumonia  with associated Acute hypoxic respiratory failure Acute metabolic encephalopathy Lactic acid 4.7 on admission with WBC 27.5, tachypnea 28 Influenza, RSV, COVID, respiratory viral panel negative.  Procalcitonin 6.05.  Chest x-ray shows bilateral bibasilar pneumonia. Afebrile, hemodynamic stable.  Continue empiric antibiotic therapy with ceftriaxone  and azithromycin .  Follow culture data. Wean oxygen as tolerated.  Appears comfortable.  Acute metabolic encephalopathy Dementia Treat pneumonia.  Patient lives alone, has some short-term memory loss but usually alert and oriented per chart.  Hypomagnesemia Hypophosphatemia Replete electrolytes  Persistent afib/flutter Chronic systolic CHF LVEF 35-40% CAD, status post redo CABG Seen by cardiology this November.  Plan to hold off on cardioversion at this time, defer to cardiologist.  Continue Xarelto , Toprol -XL, continue spironolactone  and losartan   Chronic oropharyngeal dysphagia Speech evaluation  Generalized weakness Recurrent falls PT/OT evaluations  GOC Confirmed full code with HCPOA this a.m.  Disposition From: Home w/ HH      Subjective:  Seen in ED room 2, no family present Patient appears confused, history not clearly  reliable but denies shortness of breath or pain.  No nausea or vomiting.  Physical Exam: Vitals:   02/17/24 0430 02/17/24 0515 02/17/24 0645 02/17/24 0915  BP: 133/85 (!) 108/56  (!) 117/97  Pulse:  83  72  Resp: 16 16  17   Temp:   97.6 F (36.4 C) 97.6 F (36.4 C)  TempSrc:   Oral Oral  SpO2: 99% 94%  94%   Physical Exam Vitals reviewed.  Constitutional:      General: He is not in acute distress.    Appearance: He is not ill-appearing or toxic-appearing.  HENT:     Mouth/Throat:     Comments: Oral mucosa and tongue appear unremarkable Cardiovascular:     Rate and Rhythm: Normal rate. Rhythm irregular.     Heart sounds: No murmur heard. Pulmonary:     Effort: Pulmonary effort is normal. No respiratory distress.     Breath sounds: No wheezing, rhonchi or rales.  Abdominal:     General: Abdomen is flat. There is no distension.     Palpations: Abdomen is soft.     Tenderness: There is no abdominal tenderness. There is no guarding.  Musculoskeletal:     Right lower leg: No edema.     Left lower leg: No edema.  Neurological:     Mental Status: He is alert. He is disoriented.  Psychiatric:        Mood and Affect: Mood normal.        Behavior: Behavior normal.     Data Reviewed: WBC 23.6, improved compared to yesterday Procalcitonin 6.05 BMP noted WBC down to 23.6 Chest x-ray with bilateral infiltrates  Family Communication: updated Cherene Frederick, daughter, by telephone; [name] Ditch HCPOA by telephone  Disposition: Status is: Inpatient Remains inpatient appropriate because: sepsis  Planned Discharge Destination: Home with Home Health    Time spent: 51  minutes  Author: Toribio Door, MD 02/17/2024 9:23 AM  For on call review [email or web address].  "

## 2024-02-18 DIAGNOSIS — L8990 Pressure ulcer of unspecified site, unspecified stage: Secondary | ICD-10-CM | POA: Insufficient documentation

## 2024-02-18 DIAGNOSIS — F0390 Unspecified dementia without behavioral disturbance: Secondary | ICD-10-CM | POA: Diagnosis not present

## 2024-02-18 DIAGNOSIS — R1312 Dysphagia, oropharyngeal phase: Secondary | ICD-10-CM

## 2024-02-18 DIAGNOSIS — A419 Sepsis, unspecified organism: Secondary | ICD-10-CM | POA: Diagnosis not present

## 2024-02-18 DIAGNOSIS — J9601 Acute respiratory failure with hypoxia: Secondary | ICD-10-CM | POA: Diagnosis not present

## 2024-02-18 DIAGNOSIS — R6520 Severe sepsis without septic shock: Secondary | ICD-10-CM | POA: Diagnosis not present

## 2024-02-18 DIAGNOSIS — J189 Pneumonia, unspecified organism: Secondary | ICD-10-CM | POA: Diagnosis not present

## 2024-02-18 MED ADMIN — Nutritional Supplement Liquid: 237 mL | ORAL | NDC 7007462011

## 2024-02-18 MED ADMIN — Azithromycin Tab 500 MG: 500 mg | ORAL | NDC 65862064230

## 2024-02-18 MED ADMIN — Atorvastatin Calcium Tab 40 MG (Base Equivalent): 80 mg | ORAL | NDC 50268009511

## 2024-02-18 MED ADMIN — Rivaroxaban Tab 20 MG: 20 mg | ORAL | NDC 50458057901

## 2024-02-18 MED ADMIN — Losartan Potassium Tab 25 MG: 12.5 mg | ORAL | NDC 00904704761

## 2024-02-18 MED ADMIN — Metoprolol Succinate Tab ER 24HR 50 MG (Tartrate Equiv): 50 mg | ORAL | NDC 50268054111

## 2024-02-18 MED ADMIN — CEFTRIAXONE  1 GM IVPB MIXTURE: 1 g | INTRAVENOUS | NDC 60505614800

## 2024-02-18 MED ADMIN — Spironolactone Tab 25 MG: 12.5 mg | ORAL | NDC 99999010000

## 2024-02-18 MED ADMIN — POTASSIUM PHOSPHATE HIGH DOSE (MMOL) IVPB: 30 mmol | INTRAVENOUS | NDC 00517205101

## 2024-02-18 NOTE — Evaluation (Signed)
 Physical Therapy Evaluation Patient Details Name: Shawn [name] MRN: [id number] DOB: 06/09/1935 Today's Date: 02/18/2024  History of Present Illness  Shawn [name] is an 89 year-old male apparently on hospice, presented 12/30 to ED with history of multiple falls, generalized weakness, altered mental status.  Admitted for acute hypoxic respiratory failure, severe sepsis secondary to pneumonia, acute metabolic encephalopathy superimposed on dementia, electrolyte abnormalities, wounds.   PMH including dementia, dysphagia, multiple falls at home  Clinical Impression  Pt seen for PT/OT co-eval due to medical complexity, profound weakness and significant history of falls. Pt is a poor historian and no family present to provide information regarding home set up and prior level of function. RN was able to speak to family earlier in day, reports family does not live locally and was unaware to his currently medical status. Family reports that he has a caregiver during the day but is alone overnight, has a RW which he uses when he tries to walk but frequently falls when he tries to walk.  At evaluation, he requires MAX-TOTAL +2 for bed mobility and sit<>stands.  Pt noted to have possibly new R wrist drop, right side inattention, and increased right lateral trunk lean in sitting requiring constant support of CGA-TOTAL to maintain sitting balance. See problem list below for additional deficits. He is functioning well below his perceived baseline, and unsafe to return home with current level of support. PT recommends follow up therapy with SNF for <3hrs/day of skilled intervention. If pt is to return home, he will need 24/7 supervision and physical assistance of 1-2 people. Additionally, home set up and DME needs will need to be further assessed and recommendations made should the patient and family desire to return home. PT will continue to follow pt while he is admitted to acute hospital.       If plan is  discharge home, recommend the following: Two people to help with walking and/or transfers;Two people to help with bathing/dressing/bathroom;Direct supervision/assist for medications management;Help with stairs or ramp for entrance;Assist for transportation;Direct supervision/assist for financial management;Supervision due to cognitive status   Can travel by private vehicle   No    Equipment Recommendations Other (comment) (defer to next level of care for DME needs)  Recommendations for Other Services       Functional Status Assessment Patient has had a recent decline in their functional status and demonstrates the ability to make significant improvements in function in a reasonable and predictable amount of time.     Precautions / Restrictions Precautions Precautions: Fall;Other (comment) Recall of Precautions/Restrictions: Impaired Precaution/Restrictions Comments: significant h/o falls, wounds and bruises all over body from falls, monitor HR Restrictions Weight Bearing Restrictions Per Provider Order: No      Mobility  Bed Mobility Overal bed mobility: Needs Assistance Bed Mobility: Supine to Sit, Sit to Supine     Supine to sit: Max assist, +2 for physical assistance, +2 for safety/equipment Sit to supine: Max assist, +2 for physical assistance, +2 for safety/equipment   General bed mobility comments: +2 to transfer sit<>supine, required continual assistance to maintain sitting balance, stacked pillows placed under R elbow for additional support secondary to significant R wrist drop, R side inattention, R weakness causing increased R lateral lean    Transfers Overall transfer level: Needs assistance Equipment used: None Transfers: Sit to/from Stand Sit to Stand: Total assist, +2 physical assistance           General transfer comment: x2 from EOB with +2 assist, PT/OT blocking  knees to prevent bucking. Pt unable to extend hips or trunk due to profound weakness, total A  to maintain standing balance for 10-20sec to allow RN to complete pericare and bandage change to sacrum    Ambulation/Gait                  Stairs            Wheelchair Mobility     Tilt Bed    Modified Rankin (Stroke Patients Only)       Balance Overall balance assessment: Needs assistance Sitting-balance support: Feet supported, Bilateral upper extremity supported Sitting balance-Leahy Scale: Poor Sitting balance - Comments: significant R lateral lean, unable to adequately support self on R wrist/hand due to wrist drop. Provided stack of 2-3 pillow for pt to rest elbow on, improved balance temporarily however continues to demonstrate right lateral trunk lean with difficulty correcting. Kyphotic posture also causes increased anterior lean requiring intermittent support at shoulders and trunk   Standing balance support: Bilateral upper extremity supported Standing balance-Leahy Scale: Zero Standing balance comment: TOTAL +2 for static standing, unable to achieve full standing posture and remains in squat position                             Pertinent Vitals/Pain Pain Assessment Pain Assessment: Faces Faces Pain Scale: Hurts a little bit Pain Location: generalized Pain Intervention(s): Monitored during session, Repositioned    Home Living Family/patient expects to be discharged to:: Skilled nursing facility                   Additional Comments: limited information available regarding home set up and patient's PLOF as no family was present during evaluation and pt is poor historian. Per RN from family, he has a caregiver during the day but is alone at night (10p-7a). Pt has had numerous falls resulting in bruises and wounds all over, unknown time down after several falls    Prior Function Prior Level of Function : History of Falls (last six months);Needs assist  Cognitive Assist : Mobility (cognitive);ADLs (cognitive) Mobility (Cognitive):  Intermittent cues ADLs (Cognitive): Intermittent cues Physical Assist : Mobility (physical);ADLs (physical) Mobility (physical): Transfers;Gait;Stairs ADLs (physical): Bathing;Dressing;IADLs;Toileting;Grooming Mobility Comments: has RW which family reported to RN that he walks with, multiple falls in the last 6 months (10+) ADLs Comments: hired caregiver present during day to assist with ADLs and IADLs, no available help overnight     Extremity/Trunk Assessment   Upper Extremity Assessment Upper Extremity Assessment: Defer to OT evaluation    Lower Extremity Assessment Lower Extremity Assessment: Generalized weakness;RLE deficits/detail;LLE deficits/detail RLE Deficits / Details: MMT grossly 3-/5 RLE Sensation: WNL RLE Coordination: decreased fine motor LLE Deficits / Details: MMT grossly 3/5 LLE Sensation: WNL LLE Coordination: decreased fine motor    Cervical / Trunk Assessment Cervical / Trunk Assessment: Kyphotic  Communication   Communication Communication:  (noted to have some word finding, delayed response time) Factors Affecting Communication: Hearing impaired    Cognition Arousal: Alert Behavior During Therapy: Flat affect, Restless   PT - Cognitive impairments: No family/caregiver present to determine baseline, History of cognitive impairments, Orientation, Awareness, Memory, Safety/Judgement, Problem solving, Sequencing, Initiation   Orientation impairments: Time, Situation                   PT - Cognition Comments: able to state name, DOB and city/state, RN reports orientation has improved since this AM; noted to have  decreased attention and awareness to R side UE>LE Following commands: Impaired Following commands impaired: Follows one step commands inconsistently     Cueing Cueing Techniques: Verbal cues, Gestural cues, Tactile cues, Visual cues     General Comments General comments (skin integrity, edema, etc.): poor skin integrity with multiple  wounds to shoulders, back, sacrum and legs due to history of falls and unknown time down; large bruising noted to L flank and hip, numerous scratches and skin leans on arms, legs, feet, and face. BLE edema noted at ankles    Exercises     Assessment/Plan    PT Assessment Patient needs continued PT services  PT Problem List Decreased strength;Decreased coordination;Decreased cognition;Decreased range of motion;Decreased activity tolerance;Decreased knowledge of use of DME;Impaired tone;Decreased balance;Decreased mobility;Decreased knowledge of precautions;Decreased safety awareness;Decreased skin integrity       PT Treatment Interventions Balance training;Gait training;Functional mobility training;Therapeutic activities;Therapeutic exercise;Patient/family education;Neuromuscular re-education    PT Goals (Current goals can be found in the Care Plan section)  Acute Rehab PT Goals PT Goal Formulation: Patient unable to participate in goal setting    Frequency Min 2X/week     Co-evaluation PT/OT/SLP Co-Evaluation/Treatment: Yes Reason for Co-Treatment: Complexity of the patient's impairments (multi-system involvement);For patient/therapist safety;To address functional/ADL transfers PT goals addressed during session: Mobility/safety with mobility;Balance;Strengthening/ROM OT goals addressed during session: ADL's and self-care;Strengthening/ROM       AM-PAC PT 6 Clicks Mobility  Outcome Measure Help needed turning from your back to your side while in a flat bed without using bedrails?: Total Help needed moving from lying on your back to sitting on the side of a flat bed without using bedrails?: Total Help needed moving to and from a bed to a chair (including a wheelchair)?: Total Help needed standing up from a chair using your arms (e.g., wheelchair or bedside chair)?: Total Help needed to walk in hospital room?: Total Help needed climbing 3-5 steps with a railing? : Total 6 Click  Score: 6    End of Session Equipment Utilized During Treatment: Gait belt;Oxygen Activity Tolerance: Patient tolerated treatment well;Patient limited by lethargy Patient left: in bed;with call bell/phone within reach;Other (comment) (RN present for continued dressing changes to wounds) Nurse Communication: Mobility status;Need for lift equipment PT Visit Diagnosis: Unsteadiness on feet (R26.81);Muscle weakness (generalized) (M62.81);Other abnormalities of gait and mobility (R26.89);Repeated falls (R29.6);Adult, failure to thrive (R62.7);Hemiplegia and hemiparesis Hemiplegia - Right/Left: Right Hemiplegia - dominant/non-dominant: Dominant Hemiplegia - caused by: Unspecified    Time: 8660-8591 PT Time Calculation (min) (ACUTE ONLY): 29 min   Charges:   PT Evaluation $PT Eval Moderate Complexity: 1 Mod PT Treatments $Therapeutic Activity: 8-22 mins           Isaiah DEL. Fares Ramthun, PT, DPT   [employer] 02/18/2024, 3:59 PM

## 2024-02-18 NOTE — Progress Notes (Addendum)
 " Progress Note   Patient: Shawn [name] FMW:[id number] DOB: [birth year] DOA: 02/16/2024     2 DOS: the patient was seen and examined on 02/18/2024   Brief hospital course: 89 year old male PMH including dementia, dysphagia, multiple falls at home, apparently on hospice, presented 12/30 to ED with history of multiple falls, generalized weakness, altered mental status.  Admitted for acute hypoxic respiratory failure, severe sepsis secondary to pneumonia, acute metabolic encephalopathy superimposed on dementia, electrolyte abnormalities.  Consultants None  Procedures/Events 12/30 admission for pneumonia, sepsis, hypoxia  Assessment and Plan: Severe sepsis secondary to  Bilateral pneumonia  with associated Acute hypoxic respiratory failure Acute metabolic encephalopathy Lactic acid 4.7 on admission with WBC 27.5, tachypnea 28 Influenza, RSV, COVID, respiratory viral panel negative.  Procalcitonin 6.05.  Chest x-ray showed bilateral bibasilar pneumonia. Clinically somewhat better although quite weak.  Wean oxygen as tolerated.  Continue antibiotics.   Acute metabolic encephalopathy Dementia Improved today with treatment of pneumonia. He can usually recognize family.  He has aides at home.   Hypomagnesemia Hypophosphatemia Magnesium  within normal limits.  Replete phosphorus.  Monitor for refeeding syndrome.   Persistent afib/flutter Chronic systolic CHF LVEF 35-40% CAD, status post redo CABG Seen by cardiology this November.  Plan to hold off on cardioversion at this time, defer to cardiologist.  Continue Xarelto , Toprol -XL, continue spironolactone  and losartan    Chronic oropharyngeal dysphagia Speech evaluation   Generalized weakness Recurrent falls PT/OT evaluations appreciated, likely SNF   GOC Confirmed full code with HCPOA  Discussed in detail with daughter and granddaughter at bedside.  Granddaughter reports right wrist drop is chronic in nature.  They saw him in the  summer at which time he seemed to be doing well. Daughter reports significant decline from October with substantial weight loss over the last year and increased memory problems over the last few months.  He used to weigh over 200 pounds, so lost substantial weight.  We discussed problems with appetite and weight loss and failure to thrive.  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     Subjective:  Feels okay today, no complaints Nursing was concerned about his swallowing ability but he seems to have perked up Occupational Therapy was concerned about wrist drop  Physical Exam: Vitals:   02/18/24 0854 02/18/24 0900 02/18/24 0913 02/18/24 1022  BP:   107/67   Pulse:   (!) 121   Resp: 20 19 18 20   Temp:   97.9 F (36.6 C)   TempSrc:   Axillary   SpO2:   99%   Height:       Physical Exam Vitals reviewed.  Constitutional:      General: He is not in acute distress.    Appearance: He is not ill-appearing or toxic-appearing.  HENT:     Ears:     Comments: Hard of hearing. Cardiovascular:     Rate and Rhythm: Normal rate and regular rhythm.     Heart sounds: No murmur heard. Pulmonary:     Effort: Pulmonary effort is normal. No respiratory distress.     Breath sounds: No wheezing, rhonchi or rales.  Musculoskeletal:     Right lower leg: No edema.     Left lower leg: No edema.     Comments: Right wrist drop noted.  Moves all extremities.  Neurological:     Mental Status: He is alert.  Psychiatric:        Mood and Affect: Mood normal.  Behavior: Behavior normal.     Data Reviewed: Blood cultures no growth to date  Family Communication: Daughter Kirke, granddaughter at bedside  Disposition: Status is: Inpatient Remains inpatient appropriate because: Pneumonia     Time spent: 35 minutes  Author: Toribio Door, MD 02/18/2024 1:17 PM  For on call review [email or web address].    "

## 2024-02-18 NOTE — Evaluation (Signed)
 Occupational Therapy Evaluation Patient Details Name: Shawn [name] MRN: [id number] DOB: 1935-07-22 Today's Date: 02/18/2024   History of Present Illness   Shawn [name] is an 89 year-old male apparently on hospice, presented 12/30 to ED with history of multiple falls, generalized weakness, altered mental status.  Admitted for acute hypoxic respiratory failure, severe sepsis secondary to pneumonia, acute metabolic encephalopathy superimposed on dementia, electrolyte abnormalities, wounds.   PMH including dementia, dysphagia, multiple falls at home     Clinical Impressions PTA, patient lives at home with hired pca assist during day and is alone at night but patient a poor historian and family not bedside. Currently, patient presents with deficits outlined below (see OT Problem List for details) most significantly pain, poor skin integrity, generalized muscle weakness with R wrist/distal ROM/strength/coord deficits, decreased cognition, balance, activity tolerance and visual/perceptual skills limiting BADL's and functional mobility performance. Patient will benefit from continued inpatient follow up therapy, <3 hours/day. Patient requires continued Acute care hospital level OT services to progress safety and functional performance and allow for discharge.       If plan is discharge home, recommend the following:   Two [name] to help with walking and/or transfers;A lot of help with bathing/dressing/bathroom;Assistance with cooking/housework;Assistance with feeding;Direct supervision/assist for medications management;Direct supervision/assist for financial management;Assist for transportation;Help with stairs or ramp for entrance;Supervision due to cognitive status     Functional Status Assessment   Patient has had a recent decline in their functional status and demonstrates the ability to make significant improvements in function in a reasonable and predictable amount of time.      Equipment Recommendations   BSC/3in1;Tub/shower bench;Wheelchair (measurements OT);Wheelchair cushion (measurements OT);Hospital bed;Hoyer lift      Precautions/Restrictions   Precautions Precautions: Fall;Other (comment) Recall of Precautions/Restrictions: Impaired Precaution/Restrictions Comments: significant h/o falls, wounds and bruises all over body from falls, monitor HR Restrictions Weight Bearing Restrictions Per Provider Order: No     Mobility Bed Mobility Overal bed mobility: Needs Assistance Bed Mobility: Supine to Sit, Sit to Supine     Supine to sit: Max assist, +2 for physical assistance, +2 for safety/equipment Sit to supine: Max assist, +2 for physical assistance, +2 for safety/equipment   General bed mobility comments: +2 to transfer sit<>supine, required continual assistance to maintain sitting balance, stacked pillows placed under R elbow for additional support secondary to significant R wrist drop, R side inattention, R weakness causing increased R lateral lean    Transfers Overall transfer level: Needs assistance Equipment used: None Transfers: Sit to/from Stand Sit to Stand: Total assist, +2 physical assistance           General transfer comment: x2 from EOB with +2 assist, PT/OT blocking knees to prevent bucking. Pt unable to extend hips or trunk due to profound weakness, total A to maintain standing balance for 10-20sec to allow RN to complete pericare and bandage change to sacrum      Balance Overall balance assessment: Needs assistance Sitting-balance support: Feet supported, Bilateral upper extremity supported Sitting balance-Leahy Scale: Poor Sitting balance - Comments: significant R lateral lean, unable to adequately support self on R wrist/hand due to wrist drop. Provided stack of 2-3 pillow for pt to rest elbow on, improved balance temporarily however continues to demonstrate right lateral trunk lean with difficulty correcting. Kyphotic  posture also causes increased anterior lean requiring intermittent support at shoulders and trunk   Standing balance support: Bilateral upper extremity supported Standing balance-Leahy Scale: Zero Standing balance comment: TOTAL +2 for static  standing, unable to achieve full standing posture and remains in squat position                           ADL either performed or assessed with clinical judgement   ADL Overall ADL's : Needs assistance/impaired Eating/Feeding: Maximal assistance;Sitting Eating/Feeding Details (indicate cue type and reason): L UE use only Grooming: Wash/dry hands;Wash/dry face;Maximal assistance;Cueing for sequencing;Sitting Grooming Details (indicate cue type and reason): L UE use only Upper Body Bathing: Total assistance   Lower Body Bathing: Total assistance   Upper Body Dressing : Total assistance   Lower Body Dressing: Total assistance   Toilet Transfer:  (trials STS x 4-5 with max A +2)   Toileting- Clothing Manipulation and Hygiene: Total assistance       Functional mobility during ADLs: Total assistance;+2 for physical assistance;+2 for safety/equipment General ADL Comments: unable to use R distal UE for any tasks     Vision Ability to See in Adequate Light: 1 Impaired Vision Assessment?: Vision impaired- to be further tested in functional context Additional Comments: deareased R sided scanning and awareness     Perception Perception: Impaired Preception Impairment Details: Inattention/Neglect     Praxis Praxis: Impaired Praxis Impairment Details: Initiation, Motor planning, Organization     Pertinent Vitals/Pain Pain Assessment Pain Assessment: Faces Faces Pain Scale: Hurts a little bit Breathing: normal Negative Vocalization: none Facial Expression: smiling or inexpressive Body Language: relaxed Consolability: no need to console PAINAD Score: 0 Pain Location: generalized Pain Descriptors / Indicators: Grimacing Pain  Intervention(s): Limited activity within patient's tolerance, Monitored during session, Repositioned     Extremity/Trunk Assessment Upper Extremity Assessment Upper Extremity Assessment: Generalized weakness;Right hand dominant;RUE deficits/detail RUE Deficits / Details: new onset R wrist drop and distal weakness wiht very limited AROM from wrist through hand noted RUE Coordination: decreased fine motor;decreased gross motor   Lower Extremity Assessment Lower Extremity Assessment: Defer to PT evaluation RLE Deficits / Details: MMT grossly 3-/5 RLE Sensation: WNL RLE Coordination: decreased fine motor LLE Deficits / Details: MMT grossly 3/5 LLE Sensation: WNL LLE Coordination: decreased fine motor   Cervical / Trunk Assessment Cervical / Trunk Assessment: Kyphotic   Communication Communication Communication:  (noted to have some word finding, delayed response time) Factors Affecting Communication: Hearing impaired   Cognition Arousal: Alert Behavior During Therapy: Flat affect, Restless Cognition: History of cognitive impairments             OT - Cognition Comments: alert, [place] 2 person and city, decreased STM, problem solving and safety, pleasant and cooperative, decreased insight                 Following commands: Impaired Following commands impaired: Follows one step commands inconsistently     Cueing  General Comments   Cueing Techniques: Verbal cues;Gestural cues;Tactile cues;Visual cues  multiple skin wounds, bruises and abrasions from time down in home prior to arriving to hospital, nusing with therapy during eval to replace sacral pad and wound bandages, decreased ctivity tolerance           Home Living Family/patient expects to be discharged to:: Skilled nursing facility Living Arrangements: Alone                               Additional Comments: limited information available regarding home set up and patient's PLOF as no family was  present during evaluation and pt is  poor historian. Per RN from family, he has a caregiver during the day but is alone at night (10p-7a). Pt has had numerous falls resulting in bruises and wounds all over, unknown time down after several falls      Prior Functioning/Environment Prior Level of Function : History of Falls (last six months);Needs assist  Cognitive Assist : Mobility (cognitive);ADLs (cognitive) Mobility (Cognitive): Intermittent cues ADLs (Cognitive): Intermittent cues Physical Assist : Mobility (physical);ADLs (physical) Mobility (physical): Transfers;Gait;Stairs ADLs (physical): Bathing;Dressing;IADLs;Toileting;Grooming Mobility Comments: has RW which family reported to RN that he walks with, multiple falls in the last 6 months (10+) ADLs Comments: hired caregiver present during day to assist with ADLs and IADLs, no available help overnight    OT Problem List: Decreased strength;Decreased range of motion;Decreased activity tolerance;Impaired balance (sitting and/or standing);Impaired vision/perception;Decreased coordination;Decreased cognition;Decreased safety awareness;Decreased knowledge of use of DME or AE;Decreased knowledge of precautions;Cardiopulmonary status limiting activity;Impaired sensation;Impaired UE functional use;Pain   OT Treatment/Interventions: Self-care/ADL training;Therapeutic exercise;Neuromuscular education;Energy conservation;DME and/or AE instruction;Manual therapy;Splinting;Therapeutic activities;Cognitive remediation/compensation;Visual/perceptual remediation/compensation;Patient/family education;Balance training      OT Goals(Current goals can be found in the care plan section)   Acute Rehab OT Goals Patient Stated Goal: unable to state OT Goal Formulation: With patient Time For Goal Achievement: 03/03/24 Potential to Achieve Goals: Fair ADL Goals Pt Will Perform Eating: with min assist;with adaptive utensils;sitting Pt Will Perform Grooming:  with mod assist;sitting;with adaptive equipment Pt Will Perform Upper Body Bathing: with mod assist;sitting Pt Will Perform Upper Body Dressing: with mod assist;sitting Pt Will Transfer to Toilet: with mod assist;with +2 assist;stand pivot transfer;bedside commode   OT Frequency:  Min 1X/week    Co-evaluation   Reason for Co-Treatment: Complexity of the patient's impairments (multi-system involvement);For patient/therapist safety;To address functional/ADL transfers PT goals addressed during session: Mobility/safety with mobility;Balance;Strengthening/ROM OT goals addressed during session: ADL's and self-care;Strengthening/ROM      AM-PAC OT 6 Clicks Daily Activity     Outcome Measure Help from another person eating meals?: A Lot Help from another person taking care of personal grooming?: A Lot Help from another person toileting, which includes using toliet, bedpan, or urinal?: Total Help from another person bathing (including washing, rinsing, drying)?: Total Help from another person to put on and taking off regular upper body clothing?: Total Help from another person to put on and taking off regular lower body clothing?: Total 6 Click Score: 8   End of Session Equipment Utilized During Treatment: Gait belt;Oxygen Nurse Communication: Mobility status (nursing to reapply hand mitts)  Activity Tolerance: Patient limited by fatigue Patient left: in bed;with call bell/phone within reach;with bed alarm set;with nursing/sitter in room  OT Visit Diagnosis: Unsteadiness on feet (R26.81);Other abnormalities of gait and mobility (R26.89);History of falling (Z91.81);Repeated falls (R29.6);Muscle weakness (generalized) (M62.81);Cognitive communication deficit (R41.841);Pain                Time: 8660-8591 OT Time Calculation (min): 29 min Charges:  OT General Charges $OT Visit: 1 Visit OT Evaluation $OT Eval Moderate Complexity: 1 Mod  Darel Ricketts OT/L Acute Rehabilitation Department  [phone]  02/18/2024, 5:54 PM

## 2024-02-18 NOTE — Consult Note (Signed)
 WOC Nurse Consult Note: Reason for Consult: multiple wounds  Wound type: 1.  Stage 3 Pressure Injury R scapula, 2 separate areas with 50% red 50% tan tissue  2.  Stage 3 Pressure Injury L trochanter/hip 50% red 50% tan/Stage 2 Pressure Injury R hip small red moist area  3.  Stage 3 Pressure Injury lower vertebra/sacrum 50% red 50% tan  4.  Partial thickness below L knee likely trauma red moist; Full thickness R lower leg scattered areas of dry nonviable tissue; L medial leg full thickness mix of red and brown tissue; B lower legs noted to have scattered areas of dry nonviable tissue  5.  R and L posterior arms/elbow full thickness red moist/some dry hemorrhagic tissue  6.  Deep Tissue Pressure injury coccyx purple maroon discoloration  7.  Full thickness back/vertebra ? Trauma versus pressure dark brown tissue  Pressure Injury POA: Yes Measurement: see nursing flowsheet  Wound bed: as above  Drainage (amount, consistency, odor) see nursing flowsheet  Periwound: erythema surrounding coccyx wound/medial buttocks likely r/t pressure and moisture  Dressing procedure/placement/frequency:  Cleanse R scapula, lower vertebra/sacral, L trochanter/hip and back/vertebral wounds with Vashe, do not rinse.  Apply 1/4 thick layer of Santyl  to wound beds daily, cover with saline moist gauze and silicone foam or ABD pad and clothe tape whichever is preferred. Cleanse coccyx/medial buttocks with soap and water , dry and apply a thin layer of Desitin to area 2 times daily. Cover coccyx with silicone foam.   Cleanse B arm and leg wounds with Vashe, do not rinse.  Apply Xeroform gauze ([place]son [phone]) to wound beds every other day and secure with silicone foam/Kerlix roll gauze whichever is preferred.  POC discussed with bedside nurse. WOC team will not follow. Reconsult if further needs arise.   Thank you,    Powell Bar MSN, RN-BC, [employer]

## 2024-02-19 DIAGNOSIS — Z66 Do not resuscitate: Secondary | ICD-10-CM

## 2024-02-19 DIAGNOSIS — R6520 Severe sepsis without septic shock: Secondary | ICD-10-CM | POA: Diagnosis not present

## 2024-02-19 DIAGNOSIS — A419 Sepsis, unspecified organism: Secondary | ICD-10-CM | POA: Diagnosis not present

## 2024-02-19 DIAGNOSIS — F0390 Unspecified dementia without behavioral disturbance: Secondary | ICD-10-CM | POA: Diagnosis not present

## 2024-02-19 DIAGNOSIS — R1312 Dysphagia, oropharyngeal phase: Secondary | ICD-10-CM | POA: Diagnosis not present

## 2024-02-19 DIAGNOSIS — J189 Pneumonia, unspecified organism: Secondary | ICD-10-CM | POA: Diagnosis not present

## 2024-02-19 DIAGNOSIS — J9601 Acute respiratory failure with hypoxia: Secondary | ICD-10-CM | POA: Diagnosis not present

## 2024-02-19 MED ADMIN — Calcium Carbonate (Antacid) Chew Tab 500 MG: 200 mg | ORAL | NDC 66553000401

## 2024-02-19 MED ADMIN — Atorvastatin Calcium Tab 40 MG (Base Equivalent): 80 mg | ORAL | NDC 50268009511

## 2024-02-19 MED ADMIN — Magnesium Sulfate IV Soln 2 GM/50ML (40 MG/ML): 2 g | INTRAVENOUS | NDC 83634050041

## 2024-02-19 MED ADMIN — Rivaroxaban Tab 20 MG: 20 mg | ORAL | NDC 50458057901

## 2024-02-19 MED ADMIN — Losartan Potassium Tab 25 MG: 12.5 mg | ORAL | NDC 00904704761

## 2024-02-19 MED ADMIN — Metoprolol Succinate Tab ER 24HR 50 MG (Tartrate Equiv): 50 mg | ORAL | NDC 50268054111

## 2024-02-19 MED ADMIN — CEFTRIAXONE  1 GM IVPB MIXTURE: 1 g | INTRAVENOUS | NDC 60505614800

## 2024-02-19 MED ADMIN — Spironolactone Tab 25 MG: 12.5 mg | ORAL | NDC 99999010000

## 2024-02-19 MED ADMIN — POTASSIUM PHOSPHATE HIGH DOSE (MMOL) IVPB: 30 mmol | INTRAVENOUS | NDC 00517205101

## 2024-02-19 NOTE — Progress Notes (Signed)
 " Progress Note   Patient: Shawn [name] FMW:[id number] DOB: March 02, 1935 DOA: 02/16/2024     3 DOS: the patient was seen and examined on 02/19/2024   Brief hospital course: 89 year old male PMH including dementia, dysphagia, multiple falls at home, apparently on hospice, presented 12/30 to ED with history of multiple falls, generalized weakness, altered mental status.  Admitted for acute hypoxic respiratory failure, severe sepsis secondary to pneumonia, acute metabolic encephalopathy superimposed on dementia, electrolyte abnormalities.  Consultants None  Procedures/Events 12/30 admission for pneumonia, sepsis, hypoxia  Assessment and Plan: Severe sepsis secondary to  Bilateral pneumonia  with associated Acute hypoxic respiratory failure Acute metabolic encephalopathy Lactic acid 4.7 on admission with WBC 27.5, tachypnea 28 Influenza, RSV, COVID, respiratory viral panel negative.  Procalcitonin 6.05.  Chest x-ray showed bilateral bibasilar pneumonia. Slowly improving.  Off oxygen now.  Continue antibiotics.   Acute metabolic encephalopathy Dementia Open was resolved with treatment of pneumonia.   Hypomagnesemia Hypophosphatemia Magnesium  within normal limits.  Replete phosphorus.  Monitor for refeeding syndrome.   Persistent afib/flutter Chronic systolic CHF LVEF 35-40% CAD, status post redo CABG Seen by cardiology this November.  Plan to hold off on cardioversion at this time, defer to cardiologist.  Continue Xarelto , Toprol -XL, continue spironolactone  and losartan    Chronic oropharyngeal dysphagia Speech evaluation appreciated.  N.p.o. status recommended and consideration be given to an alternative methods of feeding.  In discussion with family, we will proceed with comfort feeds.   Generalized weakness Recurrent falls PT/OT evaluations appreciated, likely SNF  Chronic right wrist drop R resting hand splint for positioning    GOC Discussed with daughter, son and  granddaughter at bedside.  We reviewed the speech therapy findings and recommendations.  We reviewed various options including feeding tube, comfort feeds, CODE STATUS options, treatment options.  Patient does not want a feeding tube, which is appropriate.  They would like to proceed with comfort feeds and change CODE STATUS to DNR/DNI which is appropriate.  Son, daughter, granddaughter in alignment. Plan SNF next week      Subjective:  Feels fine, would like to eat.  No pain.  Breathing okay.  Physical Exam: Vitals:   02/18/24 2136 02/19/24 0326 02/19/24 0429 02/19/24 1449  BP: 111/63  114/81 123/78  Pulse: (!) 106  98 (!) 122  Resp: 16  18 18   Temp: 98.1 F (36.7 C)  98.3 F (36.8 C) 98.3 F (36.8 C)  TempSrc: Oral  Oral Oral  SpO2: 95% 94% 95% 95%  Height:       Physical Exam Vitals reviewed.  Constitutional:      General: He is not in acute distress.    Appearance: He is not ill-appearing or toxic-appearing.  Cardiovascular:     Rate and Rhythm: Normal rate and regular rhythm.     Heart sounds: No murmur heard. Pulmonary:     Effort: Pulmonary effort is normal. No respiratory distress.     Breath sounds: No wheezing, rhonchi or rales.  Neurological:     Mental Status: He is alert.  Psychiatric:        Mood and Affect: Mood normal.        Behavior: Behavior normal.     Data Reviewed: Calcium  low 7.9, replete Phosphorus low 2.4, replete Magnesium  1.9, replete Hemoglobin stable 10.7 WBC down substantially 11.7  Family Communication: Daughter, son at bedside, granddaughter at bedside  Disposition: Status is: Inpatient Remains inpatient appropriate because: Pneumonia     Time spent: 35 minutes  Author:  Toribio Door, MD 02/19/2024 4:25 PM  For on call review [email or web address].    "

## 2024-02-19 NOTE — Evaluation (Signed)
 Clinical/Bedside Swallow Evaluation Patient Details  Name: Shawn [name] MRN: [id number] Date of Birth: [birth year]  Today's Date: 02/19/2024 Time: SLP Start Time (ACUTE ONLY): 0944 SLP Stop Time (ACUTE ONLY): 0951 SLP Time Calculation (min) (ACUTE ONLY): 7 min  Past Medical History:  Past Medical History:  Diagnosis Date   Dementia (HCC)    Hyperlipidemia    Hypertension    Past Surgical History:  Past Surgical History:  Procedure Laterality Date   JOINT REPLACEMENT     HPI:  Shawn [name] is a 89 y.o. male who presented to the ED 02/16/24 for evaluation of generalized weakness, frequent falls and altered mental status. CXR 12/30 with bibasilar pna. Per chart reveiw, pt has had a significant weightloss. Pt with medical history significant for dementia, hypertension, hyperlipidemia, dysphagia and paroxysmal A-fib on Xarelto .    Assessment / Plan / Recommendation  Clinical Impression  Pt presents with clinical indicators of pharyngeal dysphagia.  Pt exhibited immediate cough with thin and nectar/mildly thick liquids.  Increased wet, gurgling quality noted with mildly thick liquids.  Pt required 3 swallows with liquid consistencies.  With puree there was a light, delayed throat clear.   With solid trials there was mild diffuse oral residue and delayed cough.  Pt has experienced a recent weightloss.  RN reports concern for esophagitis, as pt has complained of sore throat.  Delayed cough could indicate some esophageal dysphagia.    Recommend instrumental assessment before continuing PO diet.  Will plan for MBSS as radiology and SLP schedules permit.   SLP Visit Diagnosis: Dysphagia, unspecified (R13.10)    Aspiration Risk  Moderate aspiration risk    Diet Recommendation NPO (pending instrumental assessment)    Medication Administration: Crushed with puree (priority oral meds only pending MBSS)    Other Recommendations Oral Care Recommendations: Oral care QID     Swallow  Evaluation Recommendations  TBD   Assistance Recommended at Discharge  N/A  Functional Status Assessment  TBD  Frequency and Duration  (TBD)          Prognosis Prognosis for improved oropharyngeal function:  (TBD)      Swallow Study   General Date of Onset: 02/16/25 HPI: Shawn [name] is a 89 y.o. male who presented to the ED 02/16/24 for evaluation of generalized weakness, frequent falls and altered mental status. CXR 12/30 with bibasilar pna. Per chart reveiw, pt has had a significant weightloss. Pt with medical history significant for dementia, hypertension, hyperlipidemia, dysphagia and paroxysmal A-fib on Xarelto . Type of Study: Bedside Swallow Evaluation Previous Swallow Assessment: none in EMR Diet Prior to this Study: Dysphagia 3 (mechanical soft);Thin liquids (Level 0) Temperature Spikes Noted: No History of Recent Intubation: No Behavior/Cognition: Alert;Cooperative;Pleasant mood Oral Cavity Assessment: Within Functional Limits Oral Care Completed by SLP: No Oral Cavity - Dentition: Adequate natural dentition Vision: Functional for self-feeding Patient Positioning: Upright in bed Baseline Vocal Quality: Normal Volitional Cough: Weak Volitional Swallow: Able to elicit    Oral/Motor/Sensory Function Overall Oral Motor/Sensory Function: Mild impairment Facial ROM: Within Functional Limits Facial Symmetry: Within Functional Limits Lingual ROM: Reduced right;Reduced left Lingual Symmetry: Within Functional Limits Lingual Strength:  (unable to test) Velum: Within Functional Limits Mandible: Within Functional Limits   Ice Chips Ice chips: Not tested   Thin Liquid Thin Liquid: Impaired Presentation: Straw Pharyngeal  Phase Impairments: Throat Clearing - Immediate;Multiple swallows    Nectar Thick Nectar Thick Liquid: Impaired Presentation: Straw Pharyngeal Phase Impairments: Cough - Immediate;Multiple swallows   Honey Thick Honey Thick  Liquid: Not tested   Puree  Puree: Impaired Presentation: Spoon Pharyngeal Phase Impairments: Throat Clearing - Delayed   Solid     Solid: Impaired Oral Phase Functional Implications: Oral residue Pharyngeal Phase Impairments: Cough - Delayed      Anette FORBES Grippe, MA, CCC-SLP Acute Rehabilitation Services Office: [phone] 02/19/2024,10:04 AM

## 2024-02-19 NOTE — NC FL2 (Signed)
 " [place]  MEDICAID FL2 LEVEL OF CARE FORM     IDENTIFICATION  Patient Name: Shawn [name] Birthdate: 1935-10-03 Sex: male Admission Date (Current Location): 02/16/2024  [facility] and [place] Number:  Producer, Television/film/video and Address:         Provider Number: [id number]  Attending Physician Name and Address:  Jadine Toribio SQUIBB, MD  Relative Name and Phone Number:  Cherene Iris(dtr)#[phone]    Current Level of Care: Hospital Recommended Level of Care: Skilled Nursing Facility Prior Approval Number:    Date Approved/Denied:   PASRR Number:    Discharge Plan: SNF    Current Diagnoses: Patient Active Problem List   Diagnosis Date Noted   Dementia without behavioral disturbance (HCC) 02/18/2024   Pressure injury of skin 02/18/2024   Dysphagia 02/17/2024   Hypomagnesemia 02/17/2024   Hypophosphatemia 02/17/2024   HFrEF (heart failure with reduced ejection fraction) (HCC) 02/17/2024   History of lymphocytosis 02/17/2024   Severe sepsis (HCC) 02/16/2024   Community acquired pneumonia 02/16/2024   Acute metabolic encephalopathy 02/16/2024   Acute hypoxic respiratory failure (HCC) 02/16/2024   Essential hypertension 02/16/2024   Paroxysmal A-fib (HCC) 02/16/2024   Mixed hyperlipidemia 02/16/2024   Generalized weakness 02/16/2024   Recurrent falls 02/16/2024   History of dementia 02/16/2024    Orientation RESPIRATION BLADDER Height & Weight     Self, Place  Normal Incontinent Weight:   Height:  6' 5 (195.6 cm) (per pt)  BEHAVIORAL SYMPTOMS/MOOD NEUROLOGICAL BOWEL NUTRITION STATUS      Incontinent Diet (Dysphagia 3)  AMBULATORY STATUS COMMUNICATION OF NEEDS Skin   Extensive Assist Verbally Bruising, PU Stage and Appropriate Care (sacrum/legs  see d/c summary) PU Stage 1 Dressing: Daily                     Personal Care Assistance Level of Assistance  Bathing, Feeding Bathing Assistance: Maximum assistance Feeding assistance: Maximum assistance        Functional Limitations Info  Sight, Hearing, Speech Sight Info: Impaired (eyeglasses) Hearing Info: Impaired (Bilateral hearing aids) Speech Info: Adequate    SPECIAL CARE FACTORS FREQUENCY  PT (By licensed PT), OT (By licensed OT)     PT Frequency: 5x week OT Frequency: 5x week            Contractures Contractures Info: Not present    Additional Factors Info  Code Status, Allergies Code Status Info: Full Allergies Info: Penicillins           Current Medications (02/19/2024):  This is the current hospital active medication list Current Facility-Administered Medications  Medication Dose Route Frequency Provider Last Rate Last Admin   acetaminophen  (TYLENOL ) tablet 650 mg  650 mg Oral Q6H PRN Amponsah, Prosper M, MD       Or   acetaminophen  (TYLENOL ) suppository 650 mg  650 mg Rectal Q6H PRN Lou Claretta HERO, MD       atorvastatin  (LIPITOR ) tablet 80 mg  80 mg Oral Daily Amponsah, Prosper M, MD   80 mg at 02/19/24 9075   bisacodyl  (DULCOLAX) EC tablet 5 mg  5 mg Oral Daily PRN Amponsah, Prosper M, MD       calcium  carbonate (TUMS - dosed in mg elemental calcium ) chewable tablet 200 mg of elemental calcium   1 tablet Oral TID Jadine Toribio SQUIBB, MD   200 mg of elemental calcium  at 02/19/24 0924   cefTRIAXone  (ROCEPHIN ) 1 g in sodium chloride  0.9 % 100 mL IVPB  1 g  Intravenous Q24H Amponsah, Prosper M, MD   Stopped at 02/18/24 1954   collagenase  (SANTYL ) ointment   Topical Daily Jadine Toribio SQUIBB, MD   Given at 02/19/24 0910   feeding supplement (ENSURE PLUS HIGH PROTEIN) liquid 237 mL  237 mL Oral BID BM Jadine Toribio SQUIBB, MD   237 mL at 02/19/24 9070   guaiFENesin  (ROBITUSSIN) 100 MG/5ML liquid 5 mL  5 mL Oral Q4H PRN Lou Claretta HERO, MD       liver oil-zinc  oxide (DESITIN) 40 % ointment   Topical BID Jadine Toribio SQUIBB, MD   Given at 02/19/24 9089   losartan  (COZAAR ) tablet 12.5 mg  12.5 mg Oral Daily Amponsah, Prosper M, MD   12.5 mg at 02/19/24 9075   magnesium   sulfate IVPB 2 g 50 mL  2 g Intravenous Once Jadine Toribio SQUIBB, MD       metoprolol  succinate (TOPROL -XL) 24 hr tablet 50 mg  50 mg Oral Daily Amponsah, Prosper M, MD   50 mg at 02/19/24 9075   ondansetron  (ZOFRAN ) tablet 4 mg  4 mg Oral Q6H PRN Amponsah, Prosper M, MD       Or   ondansetron  (ZOFRAN ) injection 4 mg  4 mg Intravenous Q6H PRN Amponsah, Prosper M, MD       potassium PHOSPHATE  30 mmol in dextrose  5 % 500 mL infusion  30 mmol Intravenous Once Jadine Toribio SQUIBB, MD 85 mL/hr at 02/19/24 0910 30 mmol at 02/19/24 0910   rivaroxaban  (XARELTO ) tablet 20 mg  20 mg Oral Q supper Amponsah, Prosper M, MD   20 mg at 02/18/24 1754   senna-docusate (Senokot-S) tablet 1 tablet  1 tablet Oral QHS PRN Lou Claretta HERO, MD       spironolactone  (ALDACTONE ) tablet 12.5 mg  12.5 mg Oral Daily Lou Claretta HERO, MD   12.5 mg at 02/19/24 9076     Discharge Medications: Please see discharge summary for a list of discharge medications.  Relevant Imaging Results:  Relevant Lab Results:   Additional Information SS#[phone], Nathanel, RN     "

## 2024-02-19 NOTE — Progress Notes (Signed)
 Speech Pathology and nurse spoke with family regarding patients orders to keep NPO.  Family understood they are not  to feed them r/t aspiration, but stated they plan on getting patient food anyway in cafeteria.  Notified MD.  MD to meet at [facility].

## 2024-02-19 NOTE — TOC PASRR Note (Signed)
 30 Day PASRR Note   Patient Details  Name: Shawn [name] Date of Birth: [birth year]   Transition of Care [facility]) CM/SW Contact:    Bascom Service, RN Phone Number: 02/19/2024, 10:59 AM  To Whom It May Concern:  Please be advised that this patient will require a short-term nursing home stay - anticipated 30 days or less for rehabilitation and strengthening.   The plan is for return home.

## 2024-02-19 NOTE — Plan of Care (Signed)
" °  Problem: Education: Goal: Knowledge of General Education information will improve Description: Including pain rating scale, medication(s)/side effects and non-pharmacologic comfort measures Outcome: Progressing   Problem: Health Behavior/Discharge Planning: Goal: Ability to manage health-related needs will improve Outcome: Progressing   Problem: Clinical Measurements: Goal: Ability to maintain clinical measurements within normal limits will improve Outcome: Progressing   Problem: Activity: Goal: Risk for activity intolerance will decrease Outcome: Not Progressing   Problem: Nutrition: Goal: Adequate nutrition will be maintained Outcome: Not Progressing   Problem: Coping: Goal: Level of anxiety will decrease Outcome: Progressing   Problem: Elimination: Goal: Will not experience complications related to bowel motility Outcome: Progressing Goal: Will not experience complications related to urinary retention Outcome: Progressing   Problem: Pain Managment: Goal: General experience of comfort will improve and/or be controlled Outcome: Progressing   Problem: Safety: Goal: Ability to remain free from injury will improve Outcome: Progressing   Problem: Skin Integrity: Goal: Risk for impaired skin integrity will decrease Outcome: Progressing   Problem: Activity: Goal: Ability to tolerate increased activity will improve Outcome: Not Progressing   Problem: Respiratory: Goal: Ability to maintain adequate ventilation will improve Outcome: Progressing Goal: Ability to maintain a clear airway will improve Outcome: Progressing   Problem: Respiratory: Goal: Ability to maintain adequate ventilation will improve Outcome: Progressing Goal: Ability to maintain a clear airway will improve Outcome: Progressing   "

## 2024-02-19 NOTE — Progress Notes (Signed)
 Rn spoke with son [name], and informed of transfer to room 1537.

## 2024-02-19 NOTE — TOC Initial Note (Addendum)
 Transition of Care [facility]) - Initial/Assessment Note    Patient Details  Name: Shawn [name] MRN: [id number] Date of Birth: 09/12/1935  [facility]) CM/SW Contact:    Bascom Service, RN Phone Number: 02/19/2024, 10:41 AM  Clinical Narrative: Dementia;spoke to Kerry(dtr) about d/c plans-agree to ST SNF;she provided me with ss#[phone];noted per admission merging chart.wt around 160lbs. Will fax out await bed offers,then choice.  -11a-await pasrr.                 Expected Discharge Plan: Skilled Nursing Facility Barriers to Discharge: Continued Medical Work up   Patient Goals and CMS Choice Patient states their goals for this hospitalization and ongoing recovery are:: Rehab CMS Medicare.gov Compare Post Acute Care list provided to:: Patient Represenative (must comment) (Kerry(dtr)) Choice offered to / list presented to : Adult Children [place] ownership interest in [facility].provided to:: Adult Children    Expected Discharge Plan and Services   Discharge Planning Services: CM Consult Post Acute Care Choice: Skilled Nursing Facility                                        Prior Living Arrangements/Services   Lives with:: Self   Do you feel safe going back to the place where you live?: Yes          Current home services: DME, Homehealth aide (rw,private duty aids 12hrs/day)    Activities of Daily Living   ADL Screening (condition at time of admission) Independently performs ADLs?: No Does the patient have a NEW difficulty with bathing/dressing/toileting/self-feeding that is expected to last >3 days?: Yes (Initiates electronic notice to provider for possible OT consult) Does the patient have a NEW difficulty with getting in/out of bed, walking, or climbing stairs that is expected to last >3 days?: Yes (Initiates electronic notice to provider for possible PT consult) Does the patient have a NEW difficulty with communication that is  expected to last >3 days?: Yes (Initiates electronic notice to provider for possible SLP consult) Is the patient deaf or have difficulty hearing?: Yes (hearing aids at home) Does the patient have difficulty seeing, even when wearing glasses/contacts?: Yes Does the patient have difficulty concentrating, remembering, or making decisions?: Yes  Permission Sought/Granted Permission sought to share information with : Case Manager Permission granted to share information with : Yes, Verbal Permission Granted              Emotional Assessment              Admission diagnosis:  Sepsis (HCC) [A41.9] Community acquired pneumonia, unspecified laterality [J18.9] Patient Active Problem List   Diagnosis Date Noted   Dementia without behavioral disturbance (HCC) 02/18/2024   Pressure injury of skin 02/18/2024   Dysphagia 02/17/2024   Hypomagnesemia 02/17/2024   Hypophosphatemia 02/17/2024   HFrEF (heart failure with reduced ejection fraction) (HCC) 02/17/2024   History of lymphocytosis 02/17/2024   Severe sepsis (HCC) 02/16/2024   Community acquired pneumonia 02/16/2024   Acute metabolic encephalopathy 02/16/2024   Acute hypoxic respiratory failure (HCC) 02/16/2024   Essential hypertension 02/16/2024   Paroxysmal A-fib (HCC) 02/16/2024   Mixed hyperlipidemia 02/16/2024   Generalized weakness 02/16/2024   Recurrent falls 02/16/2024   History of dementia 02/16/2024   PCP:  Rolinda Millman, MD Pharmacy:   [facility] [phone] - [place], [place] - [address] AT [address] [place] [address] Phone: [phone] Fax: [phone]     Social Drivers of Health (SDOH) Social History: SDOH Screenings   Food Insecurity: No Food Insecurity (02/17/2024)  Housing: Low Risk (02/17/2024)  Transportation Needs: No Transportation Needs (02/17/2024)  Utilities: Not At Risk (02/17/2024)   SDOH Interventions:     Readmission Risk Interventions     No data  to display

## 2024-02-19 NOTE — Progress Notes (Signed)
 Speech Language Pathology Treatment: Dysphagia  Patient Details Name: Shawn [name] MRN: [id number] DOB: 24-Nov-1935 Today's Date: 02/19/2024 Time: 8652-8594 SLP Time Calculation (min) (ACUTE ONLY): 18 min  Assessment / Plan / Recommendation Clinical Impression  Pt seen in room with family to review MBSS results.  Reviewed imaging in room and rationale for NPO recommendations.  Provided education regarding anatomy and physiology of swallowing, SLP role in dysphagia management, and risk of aspiration pna.  Suspect pt may not be a good candidate for long term AMN, and family does not appear that they would like to pursue PEG tube placement at this time.  PEG tube will not eliminate risk of aspiration of secretions.  Outcomes with careful hand feeding are typically as good or better than long term AMN in terms of quantity of life, and NPO status has a negative effect on quality of life.  Diet modifications were not beneficial on MBS.  Pt is unable to participate in swallow exercise program at this time, and swallow exercises will not address underlying structural causes of dysphagia.  Pt cannot reliably execute swallow compensatory strategies.  At present there is no promising intervention for improvement in swallow function.  Pt to remain NPO at present while family consider whether short term AMN would be within goals of care.  SLP will follow for PO trials, and if mentation improves pt may be able to engage in trial swallow therapy.  Recommend palliative consult.    Recommend pt remain NPO with consideration of short term alternate means of nutrition, hydration, and medication, versus move towards comfort feeding with known risk of aspiration.    HPI HPI: Shawn [name] is a 89 y.o. male who presented to the ED 02/16/24 for evaluation of generalized weakness, frequent falls and altered mental status. CXR 12/30 with bibasilar pna. Per chart reveiw, pt has had a significant weightloss. Pt with medical  history significant for dementia, hypertension, hyperlipidemia, dysphagia and paroxysmal A-fib on Xarelto .      SLP Plan  Continue with current plan of care        Swallow Evaluation Recommendations   Recommendations: NPO Medication Administration: Via alternative means Recommended consults: Consider Palliative care     Recommendations  Diet recommendations: NPO Medication Administration: Via alternative means                  Oral care QID   Frequent or constant Supervision/Assistance Dysphagia, oropharyngeal phase (R13.12)     Continue with current plan of care     Anette FORBES Grippe, MA, CCC-SLP Acute Rehabilitation Services Office: [phone] 02/19/2024, 2:12 PM

## 2024-02-19 NOTE — Procedures (Addendum)
 Modified Barium Swallow Study  Patient Details  Name: Shawn [name] MRN: [id number] Date of Birth: [birth year]  Today's Date: 02/19/2024  Modified Barium Swallow completed.  Full report located under Chart Review in the Imaging Section.  History of Present Illness Adrick Kestler is a 89 y.o. male who presented to the ED 02/16/24 for evaluation of generalized weakness, frequent falls and altered mental status. CXR 12/30 with bibasilar pna. Per chart reveiw, pt has had a significant weightloss. Pt with medical history significant for dementia, hypertension, hyperlipidemia, dysphagia and paroxysmal A-fib on Xarelto .   Clinical Impression Pt prsesents with a severe oropharyngeal dysphagia c/b delayed swallow initiation, reduced base of tongue retraction, decreased hyolaryngeal elevation and excursion, incomplete laryngeal closure, impaired to absent epiglottic inversion suspected to be 2/2 to presumed cervical osteophytes (see image below), reduced UES opening, and diminished sensation.  These deficits resulted in aspiration of all consistencies of liquids.  There was significant pharyngeal residue 2/2 reduce UES opening duration and diameter which increased with viscosity resulting in increased risk of postprandial aspiration.  Pt is unable to participate in exercise program or consistently implement compensatory strategies.  Aspiration is intermittently sensed with reflexive cough partially clearing contrast material.  Pharyngeal residue is sensed with pt attempting multiple swallows and throat clearing to attempt to clear.  Pill simulation was not attempted 2/2 concerns for safety.  Pt does not appear safe for any PO intake at this time.  Given significant weightloss documented in chart, he has likely been unable to meet his nutritional need orally.  Consider palliative care consult.     Recommend pt remain NPO at this time.  DIGEST Swallow Severity Rating*  Safety:  3  Efficiency: 3  Overall  Pharyngeal Swallow Severity: 3 1: mild; 2: moderate; 3: severe; 4: profound  *The Dynamic Imaging Grade of Swallowing Toxicity is standardized for the head and neck cancer population, however, demonstrates promising clinical applications across populations to standardize the clinical rating of pharyngeal swallow safety and severity.    Presumed cervical osteophytes:   Factors that may increase risk of adverse event in presence of aspiration Noe & Lianne 2021): Reduced cognitive function;Frail or deconditioned;Weak cough;Aspiration of thick, dense, and/or acidic materials  Swallow Evaluation Recommendations Recommendations: NPO Medication Administration: Via alternative means Recommended consults: Consider Palliative care      Anette FORBES Grippe, MA, CCC-SLP Acute Rehabilitation Services Office: [phone] 02/19/2024,1:03 PM

## 2024-02-20 DIAGNOSIS — R6520 Severe sepsis without septic shock: Secondary | ICD-10-CM | POA: Diagnosis not present

## 2024-02-20 DIAGNOSIS — A419 Sepsis, unspecified organism: Secondary | ICD-10-CM | POA: Diagnosis not present

## 2024-02-20 DIAGNOSIS — R1312 Dysphagia, oropharyngeal phase: Secondary | ICD-10-CM | POA: Diagnosis not present

## 2024-02-20 DIAGNOSIS — F0390 Unspecified dementia without behavioral disturbance: Secondary | ICD-10-CM | POA: Diagnosis not present

## 2024-02-20 DIAGNOSIS — J189 Pneumonia, unspecified organism: Secondary | ICD-10-CM | POA: Diagnosis not present

## 2024-02-20 MED ADMIN — Calcium Carbonate (Antacid) Chew Tab 500 MG: 200 mg | ORAL | NDC 66553000401

## 2024-02-20 MED ADMIN — Atorvastatin Calcium Tab 40 MG (Base Equivalent): 80 mg | ORAL | NDC 50268009511

## 2024-02-20 MED ADMIN — Rivaroxaban Tab 20 MG: 20 mg | ORAL | NDC 50458057901

## 2024-02-20 MED ADMIN — Losartan Potassium Tab 25 MG: 12.5 mg | ORAL | NDC 00904704761

## 2024-02-20 MED ADMIN — Metoprolol Succinate Tab ER 24HR 50 MG (Tartrate Equiv): 50 mg | ORAL | NDC 50268054111

## 2024-02-20 MED ADMIN — CEFTRIAXONE  1 GM IVPB MIXTURE: 1 g | INTRAVENOUS | NDC 60505614800

## 2024-02-20 MED ADMIN — Spironolactone Tab 25 MG: 12.5 mg | ORAL | NDC 99999010000

## 2024-02-20 MED ADMIN — POTASSIUM PHOSPHATE HIGH DOSE (MMOL) IVPB: 30 mmol | INTRAVENOUS | NDC 65219005409

## 2024-02-20 MED ADMIN — Magnesium Sulfate IV Soln 2 GM/50ML (40 MG/ML): 2 g | INTRAVENOUS | NDC 83634050041

## 2024-02-21 DIAGNOSIS — F0390 Unspecified dementia without behavioral disturbance: Secondary | ICD-10-CM | POA: Diagnosis not present

## 2024-02-21 DIAGNOSIS — R1312 Dysphagia, oropharyngeal phase: Secondary | ICD-10-CM | POA: Diagnosis not present

## 2024-02-21 DIAGNOSIS — J189 Pneumonia, unspecified organism: Secondary | ICD-10-CM | POA: Diagnosis not present

## 2024-02-21 DIAGNOSIS — A419 Sepsis, unspecified organism: Secondary | ICD-10-CM | POA: Diagnosis not present

## 2024-02-21 DIAGNOSIS — R6520 Severe sepsis without septic shock: Secondary | ICD-10-CM | POA: Diagnosis not present

## 2024-02-21 MED ADMIN — Calcium Carbonate (Antacid) Chew Tab 500 MG: 200 mg | ORAL | NDC 66553000401

## 2024-02-21 MED ADMIN — Atorvastatin Calcium Tab 40 MG (Base Equivalent): 80 mg | ORAL | NDC 00904629261

## 2024-02-21 MED ADMIN — Rivaroxaban Tab 20 MG: 20 mg | ORAL | NDC 50458057901

## 2024-02-21 MED ADMIN — Losartan Potassium Tab 25 MG: 12.5 mg | ORAL | NDC 00904704761

## 2024-02-21 MED ADMIN — Metoprolol Succinate Tab ER 24HR 50 MG (Tartrate Equiv): 50 mg | ORAL | NDC 50268054111

## 2024-02-21 MED ADMIN — Spironolactone Tab 25 MG: 12.5 mg | ORAL | NDC 99999010000

## 2024-02-21 MED ADMIN — Acetaminophen Tab 325 MG: 650 mg | ORAL | NDC 50580047511

## 2024-02-22 DIAGNOSIS — R6520 Severe sepsis without septic shock: Secondary | ICD-10-CM | POA: Diagnosis not present

## 2024-02-22 DIAGNOSIS — Z7189 Other specified counseling: Secondary | ICD-10-CM

## 2024-02-22 DIAGNOSIS — A419 Sepsis, unspecified organism: Secondary | ICD-10-CM | POA: Diagnosis not present

## 2024-02-22 DIAGNOSIS — Z9189 Other specified personal risk factors, not elsewhere classified: Secondary | ICD-10-CM

## 2024-02-22 DIAGNOSIS — Z515 Encounter for palliative care: Secondary | ICD-10-CM | POA: Diagnosis not present

## 2024-02-22 DIAGNOSIS — R531 Weakness: Secondary | ICD-10-CM | POA: Diagnosis not present

## 2024-02-22 DIAGNOSIS — R1310 Dysphagia, unspecified: Secondary | ICD-10-CM | POA: Diagnosis not present

## 2024-02-22 DIAGNOSIS — R296 Repeated falls: Secondary | ICD-10-CM | POA: Diagnosis not present

## 2024-02-22 DIAGNOSIS — I480 Paroxysmal atrial fibrillation: Secondary | ICD-10-CM | POA: Diagnosis not present

## 2024-02-22 DIAGNOSIS — E43 Unspecified severe protein-calorie malnutrition: Secondary | ICD-10-CM | POA: Diagnosis not present

## 2024-02-22 MED ADMIN — Nutritional Supplement Liquid: 237 mL | ORAL | NDC 7007462011

## 2024-02-22 MED ADMIN — Atorvastatin Calcium Tab 40 MG (Base Equivalent): 80 mg | ORAL | NDC 50268009511

## 2024-02-22 MED ADMIN — Nutritional Supplement Liquid: 237 mL | ORAL | NDC 7007468235

## 2024-02-22 MED ADMIN — Rivaroxaban Tab 20 MG: 20 mg | ORAL | NDC 50458057901

## 2024-02-22 MED ADMIN — Losartan Potassium Tab 25 MG: 12.5 mg | ORAL | NDC 00904704761

## 2024-02-22 MED ADMIN — Metoprolol Succinate Tab ER 24HR 50 MG (Tartrate Equiv): 50 mg | ORAL | NDC 50268054111

## 2024-02-22 MED ADMIN — Spironolactone Tab 25 MG: 12.5 mg | ORAL | NDC 99999010000

## 2024-02-22 MED ADMIN — POTASSIUM PHOSPHATE HIGH DOSE (MMOL) IVPB: 30 mmol | INTRAVENOUS | NDC 65219005409

## 2024-02-22 MED ADMIN — Magnesium Sulfate IV Soln 2 GM/50ML (40 MG/ML): 2 g | INTRAVENOUS | NDC 83634050041

## 2024-02-22 NOTE — Consult Note (Addendum)
 "                              Consultation Note Date: 02/22/2024   Patient Name: Shawn [name]  DOB: [birth year]  MRN: [id number]  Age / Sex: 89 y.o., male  PCP: Rolinda Millman, MD Referring Physician: Jadine Toribio SQUIBB, MD  Reason for Consultation: Establishing goals of care  HPI/Patient Profile: 89 y.o. male  with past medical history significant of dementia, hypertension, hyperlipidemia, dysphagia, and paroxysmal atrial fibrillation on Xarelto  admitted on 02/16/2024 with generalized weakness, frequent falls, and altered mental status.  He has chronic cough, poor appetite, and seems confused and agitated compared to his baseline.  During ED encounter, patient was confused, able to follow commands but unable to answer any questions.  ED course: Chest x-ray shows bibasilar pneumonia.  EKG shows sinus tachycardia with atypical RBBB.  Patient received IV NS 1 L bolus x 2, started IV Rocephin  and IV azithromycin .  Tried hospitalist was consulted for admission.  Note that patient has had 1 inpatient admission in the last 6 months.  This is hospital day #6.   PMT has been consulted to assist with goals of care conversation. Patient/Family face treatment option decisions, advanced directive decisions and anticipatory care needs.   Family face treatment option decision, advance directive decisions and anticipatory care needs.   Clinical Assessment and Goals of Care:  I have reviewed medical records including: EPIC notes: Reviewed hospitalist progress note from 02/21/2024 detailing plan of care mainly for acute hypoxic respiratory failure secondary to bilateral pneumonia (resolved) acute metabolic encephalopathy, electrolyte imbalance, and other comorbidities including chronic oropharyngeal dysphagia.  Reviewed speech pathologist note from 02/22/2024, noting that comfort feeds were initiated with family understanding the risk of of aspiration.  Also reviewed physical therapist note from 02/21/2024.   Reviewed to track clinical course and prognostication.  Vital signs: Vital signs 1 02/22/2024 at 0 5:13 AM reviewed: Temperature 97.8, blood pressure 108/67, pulse rate 93 bpm, RR 18, patient is saturating well at 97% on room air. MAR: Reviewed PRN meds received over the last 24 hours.  No as needed medication administered.  Reviewed to assess needs for medication adjustment to optimize comfort.  Available advanced directives in ACP: Not on file Labs: Reviewed lab results from 02/22/2024: Phosphorus low at 2.3.  Potassium phosphate  ordered by hospitalist.  Reviewed to assist with prescribing and prognostication  Met patient to perform physical examination  and then had a meeting with son, Shawn [name] to discuss diagnosis prognosis, GOC, EOL wishes, disposition and options.  I introduced Palliative Medicine as specialized medical care for people living with serious illness. It focuses on providing relief from the symptoms and stress of a serious illness. The goal is to improve quality of life for both the patient and the family.   Created space and opportunity for son to explore thoughts and feelings regarding patient's current medical condition.    During the encounter, son, Shawn [name] was engaged to review their understanding of the reason for the current hospital admission. The patient and/or family were able to articulate the primary reason for hospitalization.  The son reports that the patient has experienced generalized weakness with frequent falls, including 3 falls during the week of Christmas, along with progressive confusion.  He also notes coughing episodes with choking and a chronic cough that has worsened over the past few days with increased mucus production.  The patient has had poor appetite and  expressed that he was not feeling well, requesting to be taken to the hospital.  Hence the reason for him being here. Shawn [name] also demonstrated awareness of the patients ongoing medical conditions significant of  dementia and acknowledged how this comorbidity may contribute to the current episode of illness.   The overall severity of the patients illness was discussed with Shawn [name].  We discussed best and worst-case scenarios in the context of the patient's current medical condition and underlying comorbidities.  I discussed that patient is currently being treated for multiple medical issues but mainly for acute metabolic encephalopathy, chronic systolic CHF, persistent A-fib/flutter, and generalized weakness.  I also mentioned that patient is also being treated and seen by speech therapist/pathologist due to his ongoing oropharyngeal dysphagia.  I shared my worries with Shawn [name] that this is what we call adult failure to thrive syndrome.  It was explained that the patient is experiencing a significant burden of disease contributing to a complex clinical picture. The discussion included education about the high risk for further health decline, potential for setbacks, and the likelihood of recurrent hospitalizations. The patient and/or family were informed that, even with recovery from the current episode, the overall prognosis remains guarded due to the cumulative impact of ongoing health challenges.  We discussed the importance of establishing clear goals of care, ensuring that all interventions and treatments align with the patients beliefs, values, and preferences. Shawn [name] was encouraged to share his wishes and priorities, and we reviewed how our care plan can best support these goals, whether that involves pursuing aggressive interventions or focusing on comfort measures.  We discussed the family's hope for clinical improvement, improvement in patient's appetite and swallow function, as well as their worries or concerns regarding continued decline despite of medical interventions in place. This perspectives were acknowledged and incorporated into ongoing care planning.  I educated the patient and family about the concept of  failure to thrive in the context of serious illness, describing it as a state of progressive functional and nutritional decline that is not reversible with standard medical interventions. We discussed how this diagnosis reflects the bodys inability to recover or regain strength, and how it often signals a transition to a more comfort-focused approach.  We reviewed the options and implications of artificial feeding (such as feeding tube placement) and other life-prolonging measures. I explained the potential risks and benefits, including the possibility that such interventions may not improve quality of life or overall outcomes in advanced illness. We contrasted these with a comfort-based approach, which prioritizes symptom management and enjoyment of food as tolerated. Based on our discussion, Shawn [name] made it clear that the family is not wanting for patient to be on any type of feeding tube (both NGT and PEG), and that they opted to allow comfort foods understanding aspiration risks. I shared that this is reasonable given patient's current medical state.   I provided education on the different code status options:  DNR-Limited: Some interventions may be withheld, but other treatments may continue. DNR Comfort: No resuscitation or aggressive interventions; all care is focused on comfort.  We further explored the pathway for care escalation should the patients condition worsen. The family was given the opportunity to consider whether they would want escalation in the level of care (such as transfer to intensive care or use of non-invasive ventilation), or if they would prefer to shift to a comfort-focused approach, prioritizing symptom management and quality of life. The family expressed understanding of these options and the importance of  aligning future interventions with the patients goals. Shawn [name] is undecided if to escalate or de-escalate level of care when patient's condition declines, and stated that the  family will decide at that point if and when this happens.   I provided education on the philosophy of hospice and palliative care, highlighting the focus on symptom management, comfort, and support for both patient and family. I shared that hospice is a federal Medicare benefit, and I reviewed what hospice services entail, including interdisciplinary support (nursing, social work, building services engineer), medication management, equipment provision, and respite care. I clarified eligibility criteria and the process for enrollment.  The current medical plan was reviewed, with a focus on treating reversible conditions and medical optimization. I summarized that based on our conversation today, Shawn [name] opted that we will continue to address treatable issues as appropriate, while also allowing time to assess outcomes and adjust the plan as needed, hoping for clinical improvement. No to feeding tubes and allow comfort foods as tolerated, understanding the aspiration risks involved. Shawn [name] would also like for family to have a follow-up GOC discussion when his other siblings are available to attend.   We talked about transition to comfort measures in house and what that would entail inclusive of medications to control pain, dyspnea, agitation, nausea, itching, and hiccups. We discussed stopping all uneccessary measures such as cardiac monitoring, blood draws, needle sticks, and frequent vital signs. Utilized reflective listening throughout our time together.   Reviewed that the patient is in the final phase of progressive memory loss and cognitive decline, with inability to communicate, recognize loved ones, eat, or move independently. Emphasized focus on comfort, dignity, and prevention of distress.   Discussed the difference between a aggressive medical intervention path  and a palliative comfort care path for this patient at this time was had.  Values and goals of care important to patient and family were attempted to be  elicited. Natural trajectory and expectations at EOL were discussed.    Discussed the importance of continued conversation with family and the medical providers regarding overall plan of care and treatment options, ensuring decisions are within the context of the patients values and GOCs.   Questions and concerns were addressed.  The family was encouraged to call with questions or concerns.  PMT will continue to support holistically.   Social History: Patient lives alone but has a caregiver all week long that comes and help him with ADLs. He is widowed but married for 57 years. Has 3 adult children, 2 sons and 1 daughter.  Worked for [employer], retired 25 years ago. Enjoys traveling, has great sense of humor.   Functional and Nutritional State: Patient is currently with functional limitation due to acute and chronic illnesses. At baseline he ambulates with a walker. His appetite has been great historically.   Palliative Symptoms: Increasing confusion, generalized weakness, dysphagia  Advance Directives: A detailed discussion regarding advanced directives was held with the patient and family today, highlighting the benefits of having clear documentation to guide care in accordance with the patients wishes. The patient has previously completed advanced directive documents. I advised the patient and family to provide a copy for inclusion in our medical records to ensure their preferences are honored.   Code Status: DNR-Limited Concepts specific to code status, artificial feeding and hydration, continued IV antibiotics and rehospitalization was held. The difference between aggressive medical intervention and a palliative comfort path for this patient at this time was had.   Primary Decision Maker: Children: Cherene Frederick,  and Shawn [name] Ditch.      SUMMARY OF RECOMMENDATIONS   # Complex medical decision making/goals of care Code Status: Maintain DNR/DNI Continue with current medical  interventions, allow time for outcomes, hopeful for improvement Ongoing dysphagia.  Family desires no feeding tube placement, including NGT or PEG tube, instead allow for oral intake of comfort foods as tolerated, understanding risk for aspiration. Shawn [name] (son) requesting another goals of care family meeting to further determine treatment pathway should patient continues to decline. I emphasized the importance of continued conversation with family and the medical providers regarding overall plan of care ensuring decisions are within context of patient's values and goals of care. Spiritual care consult-declined at this time Palliative medicine team will continue to follow.   # Symptom management Per Primary team Palliative medicine is available to assist as needed.    # Psychosocial support Continue to provide psycho-social and emotional support to patient and family  # Discharge planning To be determined  # Treatment plan discussed with:  Patient's son Shawn [name], nursing staff and medical team   Code Status/Advance Care Planning: DNR   Palliative Prophylaxis:  Aspiration, Delirium Protocol, Eye Care, Frequent Pain Assessment, and Oral Care   Prognosis:  Prognosis is guarded given significant disease burden, and high risk for decompensation  Discharge Planning: To Be Determined      Primary Diagnoses: Present on Admission:  Severe sepsis [facility])    Physical Exam Constitutional:      Appearance: He is ill-appearing.  HENT:     Head: Normocephalic and atraumatic.     Mouth/Throat:     Mouth: Mucous membranes are moist.     Comments: Heard intermittent coughing Cardiovascular:     Rate and Rhythm: Normal rate.  Pulmonary:     Effort: Pulmonary effort is normal.  Abdominal:     General: Abdomen is flat. Bowel sounds are normal.     Palpations: Abdomen is soft.  Musculoskeletal:     Comments: Generalized weakness  Skin:    General: Skin is warm.  Neurological:     Mental  Status: He is alert. Mental status is at baseline.     Comments: Confusion intermittent  Psychiatric:        Mood and Affect: Mood normal.     Vital Signs: BP 108/67 (BP Location: Left Arm)   Pulse 93   Temp 97.8 F (36.6 C)   Resp 18   Ht 6' 5 (1.956 m) Comment: per pt  SpO2 97%  Pain Scale: 0-10   Pain Score: 0-No pain   SpO2: SpO2: 97 % O2 Device:SpO2: 97 % O2 Flow Rate: .O2 Flow Rate (L/min): 1 L/min   Palliative Assessment/Data: 40 to 50%   I personally spent a total of 90 minutes in the care of the patient today including preparing to see the patient, getting/reviewing separately obtained history, performing a medically appropriate exam/evaluation, counseling and educating, referring and communicating with other health care professionals, documenting clinical information in the EHR, independently interpreting results, and coordinating care.   Thank you for allowing us  to participate in the care of Aida JINNY Ditch Kathlyne JULIANNA Tracie Mickey, NP  Palliative Medicine Team Team phone # [phone]  Thank you for allowing the Palliative Medicine Team to assist in the care of this patient. Please utilize secure chat with additional questions, if there is no response within 30 minutes please call the above phone number.  Palliative Medicine Team providers are available by phone from 7am to 7pm daily  and can be reached through the team cell phone.  Should this patient require assistance outside of these hours, please call the patient's attending physician.   "

## 2024-02-23 MED ORDER — ADULT MULTIVITAMIN W/MINERALS CH: 1.0000 | ORAL_TABLET | Freq: Every day | ORAL | Status: DC

## 2024-02-23 MED ORDER — ENSURE PLUS HIGH PROTEIN PO LIQD: 237.0000 mL | Freq: Three times a day (TID) | ORAL | Status: DC

## 2024-02-23 MED ADMIN — Atorvastatin Calcium Tab 40 MG (Base Equivalent): 80 mg | ORAL | NDC 50268009511

## 2024-02-23 MED ADMIN — Nutritional Supplement Liquid: 237 mL | ORAL | NDC 7007462011

## 2024-02-23 MED ADMIN — Spironolactone Tab 25 MG: 12.5 mg | ORAL | NDC 99999010000

## 2024-02-23 MED ADMIN — THERA M PLUS PO TABS: 1 | ORAL | NDC 1650058694

## 2024-02-23 NOTE — Discharge Summary (Addendum)
 " Physician Discharge Summary   Patient: Shawn [name] MRN: [id number] DOB: 05-28-1935  Admit date:     02/16/2024  Discharge date: 02/23/2024  Discharge Physician: Toribio Door   PCP: Rolinda Millman, MD   Recommendations at discharge:  Ongoing care for dementia, failure to thrive, dysphagia cardiac conditions as below, severe malnutrition  Discharge Diagnoses: Principal Problem:   Severe sepsis [facility]) Active Problems:   Community acquired pneumonia   Acute metabolic encephalopathy   Acute hypoxic respiratory failure (HCC)   Essential hypertension   Paroxysmal A-fib (HCC)   Mixed hyperlipidemia   Generalized weakness   Recurrent falls   History of dementia   Dysphagia   Hypomagnesemia   Hypophosphatemia   HFrEF (heart failure with reduced ejection fraction) (HCC)   History of lymphocytosis   Dementia without behavioral disturbance (HCC)   Pressure injury of skin   Protein-calorie malnutrition, severe  Resolved Problems:   * No resolved hospital problems. *  Hospital Course: 89 year old male PMH including dementia, dysphagia, multiple falls at home, apparently on hospice, presented 12/30 to ED with history of multiple falls, generalized weakness, altered mental status.  Admitted for acute hypoxic respiratory failure, severe sepsis secondary to pneumonia, acute metabolic encephalopathy superimposed on dementia, electrolyte abnormalities.  Overall improved with resolution of pneumonia, sepsis, metabolic encephalopathy and hypoxia.  Severe dysphagia with recommendation for n.p.o., but family elected comfort feeds.  Plan for SNF.  Consultants None  Procedures/Events None   Severe sepsis resolved, secondary to  Bilateral pneumonia, resolved with associated Acute hypoxic respiratory failure, resolved Acute metabolic encephalopathy, resolved Lactic acid 4.7 on admission with WBC 27.5, tachypnea 28 Influenza, RSV, COVID, respiratory viral panel negative.  Procalcitonin  6.05.  Chest x-ray showed bilateral bibasilar pneumonia. Hypoxia resolved.  Appears stable.  Completed antibiotics.   Acute metabolic encephalopathy Dementia Encephalopathy resolved.  Dementia stable.   Hypomagnesemia Hypophosphatemia Treated   Persistent afib/flutter Chronic systolic CHF LVEF 35-40% CAD, status post redo CABG Seen by cardiology November 2025.  Plan to hold off on cardioversion at this time, defer to cardiologist.  Continue Xarelto , Toprol -XL, continue spironolactone  and losartan    Chronic oropharyngeal dysphagia Speech evaluation appreciated.  N.p.o. status recommended and consideration be given to an alternative methods of feeding.  In discussion with family, we will proceed with comfort feeds.  Has coughing with eating.  Family understands risk. Oral intake much improved today.  Will hold off on tube insertion.  Plan for SNF.   Generalized weakness Recurrent falls PT/OT evaluations appreciated.  Plan SNF.   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  Family reports chronic but definitely worse here.  X-ray no acute abnormalities.  Place splint as ordered.  Can follow-up as an outpatient.    GOC DNR/DNI, comfort feeds, risk of aspiration and severe illness or death understood by family. Plan for SNF   Severe malnutrition in context of chronic illness DYS 3 diet for comfort feeds.  Ensure Plus High Protein po TID, each supplement provides 350 kcal and 20 grams of protein. Magic cup TID with meals, each supplement provides 290 kcal and 9 grams of protein Multivitamin with minerals daily.  Detailed discussion with family, progressive dysphagia, acute on chronic wrist drop.  Multiple other issues.  No compelling indication of CVA.  02/18/24 WOC Nurse Progress Note: Wound type: 1.  Stage 3 Pressure Injury R scapula, 2 separate areas with 50% red 50% tan tissue 2.  Stage 3 Pressure Injury L trochanter/hip 50% red 50% tan/Stage  2 Pressure Injury  R hip small red moist area 3.  Stage 3 Pressure Injury lower vertebra/sacrum 50% red 50% tan Pressure Injury POA: Yes  Cleanse R scapula, lower vertebra/sacral, L trochanter/hip and back/vertebral wounds with Vashe, do not rinse.  Apply 1/4 thick layer of Santyl to wound beds daily, cover with saline moist gauze and silicone foam or ABD pad and clothe tape whichever is preferred. Cleanse coccyx/medial buttocks with soap and water , dry and apply a thin layer of Desitin to area 2 times daily. Cover coccyx with silicone foam.      Disposition: Skilled nursing facility Diet recommendation:  Diet Orders (From admission, onward)     Start     Ordered   02/19/24 1546  DIET DYS 3 Room service appropriate? Yes; Fluid consistency: Thin  Diet effective now       Question Answer Comment  Room service appropriate? Yes   Fluid consistency: Thin      02/19/24 1546            DISCHARGE MEDICATION: Allergies as of 02/23/2024       Reactions   Penicillins Anaphylaxis        Medication List     TAKE these medications    atorvastatin  80 MG tablet Commonly known as: LIPITOR  Take 80 mg by mouth daily.   feeding supplement Liqd Take 237 mLs by mouth 3 (three) times daily between meals.   losartan  25 MG tablet Commonly known as: COZAAR  Take 12.5 mg by mouth daily.   metoprolol  succinate 50 MG 24 hr tablet Commonly known as: TOPROL -XL Take 50 mg by mouth daily. Take with or immediately following a meal.   multivitamin with minerals Tabs tablet Take 1 tablet by mouth daily. Start taking on: February 24, 2024   rivaroxaban  20 MG Tabs tablet Commonly known as: XARELTO  Take 20 mg by mouth daily.   spironolactone  25 MG tablet Commonly known as: ALDACTONE  Take 12.5 mg by mouth daily.               Discharge Care Instructions  (From admission, onward)           Start     Ordered   02/23/24 0000  Discharge wound care:       Comments: Wound care  Every other day     Comments: Cleanse B arm and leg wounds with Vashe, do not rinse.  Apply Xeroform gauze ([id number] [id number]) to wound beds every other day and secure with silicone foam/Kerlix roll gauze whichever is preferred.  02/18/24 0734     02/18/24 1000    Wound care  Daily      Comments: Cleanse R scapula, lower vertebra/sacral, L trochanter/hip and back/vertebral wounds with Vashe, do not rinse.  Apply 1/4 thick layer of Santyl to wound beds daily, cover with saline moist gauze and silicone foam or ABD pad and clothe tape whichever is preferred.   02/23/24 1225            Contact information for follow-up providers     Rolinda Millman, MD. Schedule an appointment as soon as possible for a visit in 1 week(s).   Specialty: Family Medicine Contact information: [phone] [address] [place] [address] [phone]              Contact information for after-discharge care     Destination     [facility] .   Service: Assisted Living Contact information: [address] [place]  [phone]  [phone]                    Discharge Exam: There were no vitals filed for this visit. Physical Exam Vitals reviewed.  Constitutional:      General: He is not in acute distress.    Appearance: He is not ill-appearing or toxic-appearing.  Cardiovascular:     Rate and Rhythm: Normal rate and regular rhythm.     Heart sounds: No murmur heard. Pulmonary:     Effort: Pulmonary effort is normal. No respiratory distress.     Breath sounds: No wheezing, rhonchi or rales.  Musculoskeletal:     Comments: Right wrist drop  Neurological:     Mental Status: He is alert.  Psychiatric:        Mood and Affect: Mood normal.        Behavior: Behavior normal.      Condition at discharge: fair  The results of significant diagnostics from this hospitalization (including imaging, microbiology, ancillary and laboratory) are listed below for reference.   Imaging  Studies: DG Wrist 2 Views Right Result Date: 02/20/2024 CLINICAL DATA:  Acquired right wrist drop EXAM: RIGHT WRIST - 2 VIEW COMPARISON:  None Available. FINDINGS: There is no evidence of fracture or dislocation. Moderate narrowing of radiocarpal joint is noted suggesting degenerative change. Mild degenerative changes seen involving first carpometacarpal joint. Soft tissues are unremarkable. IMPRESSION: Degenerative changes as described above. No acute abnormality seen. Electronically Signed   By: Lynwood Landy Raddle M.D.   On: 02/20/2024 17:39   DG Swallowing Func-Speech Pathology Result Date: 02/19/2024 Table formatting from the original result was not included. Images from the original result were not included. Modified Barium Swallow Study Patient Details Name: Shawn [name] MRN: [id number] Date of Birth: 05-29-1935 Today's Date: 02/19/2024 HPI/PMH: HPI: Shawn [name] is a 89 y.o. male who presented to the ED 02/16/24 for evaluation of generalized weakness, frequent falls and altered mental status. CXR 12/30 with bibasilar pna. Per chart reveiw, pt has had a significant weightloss. Pt with medical history significant for dementia, hypertension, hyperlipidemia, dysphagia and paroxysmal A-fib on Xarelto . Clinical Impression: Pt prsesents with a severe oropharyngeal dysphagia c/b delayed swallow initiation, reduced base of tongue retraction, decreased hyolaryngeal elevation and excursion, incomplete laryngeal closure, impaired to absent epiglottic inversion suspected to be 2/2 to presumed cervical osteophytes (see image below), reduced UES opening, and diminished sensation.  These deficits resulted in aspiration of all consistencies of liquids.  There was significant pharyngeal residue 2/2 reduce UES opening duration and diameter which increased with viscosity resulting in increased risk of postprandial aspiration.  Pt is unable to participate in exercise program or consistently implement compensatory strategies.   Aspiration is intermittently sensed with reflexive cough partially clearing contrast material.  Pharyngeal residue is sensed with pt attempting multiple swallows and throat clearing to attempt to clear.  Pill simulation was not attempted 2/2 concerns for safety.  Pt does not appear safe for any PO intake at this time.  Given significant weightloss documented in chart, he has likely been unable to meet his nutritional need orally.  Consider palliative care consult.   Recommend pt remain NPO at this time. DIGEST Swallow Severity Rating*  Safety:  3  Efficiency: 3  Overall Pharyngeal Swallow Severity: 3 1: mild; 2: moderate; 3: severe; 4: profound *The Dynamic Imaging Grade of Swallowing Toxicity is standardized for the head and neck cancer population, however, demonstrates promising clinical applications across populations to standardize the clinical rating  of pharyngeal swallow safety and severity. Presumed cervical osteophytes: Factors that may increase risk of adverse event in presence of aspiration Noe & Lianne 2021): Factors that may increase risk of adverse event in presence of aspiration Noe & Lianne 2021): Reduced cognitive function; Frail or deconditioned; Weak cough; Aspiration of thick, dense, and/or acidic materials Recommendations/Plan: Swallowing Evaluation Recommendations Swallowing Evaluation Recommendations Recommendations: NPO Medication Administration: Via alternative means Recommended consults: Consider Palliative care Treatment Plan Treatment Plan Treatment recommendations: Therapy as outlined in treatment plan below Follow-up recommendations: Acute inpatient rehab (3 hours/day) Functional status assessment: Patient has had a recent decline in their functional status and demonstrates the ability to make significant improvements in function in a reasonable and predictable amount of time. Treatment frequency: Min 2x/week Treatment duration: 2 weeks Interventions: Patient/family education;  Trials of upgraded texture/liquids Recommendations Recommendations for follow up therapy are one component of a multi-disciplinary discharge planning process, led by the attending physician.  Recommendations may be updated based on patient status, additional functional criteria and insurance authorization. Assessment: Orofacial Exam: Orofacial Exam Oral Cavity: Oral Hygiene: WFL Oral Cavity - Dentition: Adequate natural dentition Orofacial Anatomy: WFL Oral Motor/Sensory Function: -- (see BSE) Anatomy: Anatomy: Suspected cervical osteophytes Boluses Administered: Boluses Administered Boluses Administered: Thin liquids (Level 0); Mildly thick liquids (Level 2, nectar thick); Moderately thick liquids (Level 3, honey thick); Puree; Solid  Oral Impairment Domain: Oral Impairment Domain Lip Closure: Escape progressing to mid-chin Tongue control during bolus hold: Posterior escape of greater than half of bolus (unable to follow directions) Bolus preparation/mastication: Slow prolonged chewing/mashing with complete recollection Bolus transport/lingual motion: Slow tongue motion Oral residue: Trace residue lining oral structures Location of oral residue : Tongue Initiation of pharyngeal swallow : Pyriform sinuses  Pharyngeal Impairment Domain: Pharyngeal Impairment Domain Soft palate elevation: No bolus between soft palate (SP)/pharyngeal wall (PW) Laryngeal elevation: Partial superior movement of thyroid  cartilage/partial approximation of arytenoids to epiglottic petiole Anterior hyoid excursion: Partial anterior movement Epiglottic movement: Partial inversion; No inversion Laryngeal vestibule closure: Incomplete, narrow column air/contrast in laryngeal vestibule Pharyngeal stripping wave : Present - diminished Pharyngeal contraction (A/P view only): N/A Pharyngoesophageal segment opening: Minimal distention/minimal duration, marked obstruction of flow Tongue base retraction: Wide column of contrast or air between tongue  base and PPW Pharyngeal residue: Majority of contrast within or on pharyngeal structures Location of pharyngeal residue: Valleculae; Pyriform sinuses; Pharyngeal wall  Esophageal Impairment Domain: Esophageal Impairment Domain Esophageal clearance upright position: -- (N/A) Pill: Pill Consistency administered: -- (Not tested) Penetration/Aspiration Scale Score: Penetration/Aspiration Scale Score 1.  Material does not enter airway: Puree; Solid 8.  Material enters airway, passes BELOW cords without attempt by patient to eject out (silent aspiration) : Moderately thick liquids (Level 3, honey thick); Mildly thick liquids (Level 2, nectar thick); Thin liquids (Level 0) Compensatory Strategies: Compensatory Strategies Compensatory strategies: No   General Information: Caregiver present: No  Diet Prior to this Study: NPO   No data recorded  No data recorded  No data recorded  History of Recent Intubation: No  Behavior/Cognition: Alert; Cooperative; Pleasant mood Self-Feeding Abilities: Able to self-feed No data recorded No data recorded Volitional Swallow: Able to elicit Exam Limitations: Poor positioning Goal Planning: Prognosis for improved oropharyngeal function: Guarded Barriers to Reach Goals: Time post onset; Severity of deficits; Cognitive deficits No data recorded Patient/Family Stated Goal: unable to state, no family present Consulted and agree with results and recommendations: Nurse; Physician Pain: Pain Assessment Pain Assessment: PAINAD Faces Pain Scale: 2 Breathing: 0 Negative  Vocalization: 0 Facial Expression: 0 Body Language: 0 Consolability: 0 PAINAD Score: 0 Pain Location: generalized Pain Descriptors / Indicators: Grimacing Pain Intervention(s): Limited activity within patient's tolerance; Monitored during session; Repositioned End of Session: Start Time:SLP Start Time (ACUTE ONLY): 1208 Stop Time: SLP Stop Time (ACUTE ONLY): 1225 Time Calculation:SLP Time Calculation (min) (ACUTE ONLY): 17 min Charges:  SLP Evaluations $ SLP Speech Visit: 1 Visit SLP Evaluations $BSS Swallow: 1 Procedure $MBS Swallow: 1 Procedure SLP visit diagnosis: SLP Visit Diagnosis: Dysphagia, oropharyngeal phase (R13.12) Past Medical History: Past Medical History: Diagnosis Date  Dementia (HCC)   Hyperlipidemia   Hypertension  Past Surgical History: Past Surgical History: Procedure Laterality Date  JOINT REPLACEMENT   Anette FORBES Grippe, MA, CCC-SLP Acute Rehabilitation Services Office: [phone] 02/19/2024, 1:06 PM  DG Chest Port 1 View if patient is in a treatment room. Result Date: 02/16/2024 CLINICAL DATA:  Sepsis. EXAM: PORTABLE CHEST 1 VIEW COMPARISON:  None Available. FINDINGS: Bibasilar opacities concerning for pneumonia. No pleural effusion. No detectable pneumothorax. The cardiac silhouette is within limits. Median sternotomy wires. No acute osseous pathology. IMPRESSION: Bibasilar pneumonia. Electronically Signed   By: Vanetta Chou M.D.   On: 02/16/2024 18:47    Microbiology: Results for orders placed or performed during the hospital encounter of 02/16/24  Culture, blood (Routine x 2)     Status: None   Collection Time: 02/16/24  5:45 PM   Specimen: BLOOD RIGHT FOREARM  Result Value Ref Range Status   Specimen Description   Final    BLOOD RIGHT FOREARM Performed at [facility], [address] [place], [place] [address]    Special Requests   Final    BOTTLES DRAWN AEROBIC AND ANAEROBIC Blood Culture adequate volume Performed at [facility], [address] [place], [place] [address]    Culture   Final    NO GROWTH 5 DAYS Performed at [facility], [address] [place], [place] [address]    Report Status 02/21/2024 FINAL  Final  Resp panel by RT-PCR (RSV, Flu A&B, Covid) Anterior Nasal Swab     Status: None   Collection Time: 02/16/24  5:49 PM   Specimen: Anterior Nasal Swab  Result Value Ref Range Status   SARS Coronavirus 2 by RT PCR NEGATIVE NEGATIVE Final     Comment: (NOTE) SARS-CoV-2 target nucleic acids are NOT DETECTED.  The SARS-CoV-2 RNA is generally detectable in upper respiratory specimens during the acute phase of infection. The lowest concentration of SARS-CoV-2 viral copies this assay can detect is 138 copies/mL. A negative result does not preclude SARS-Cov-2 infection and should not be used as the sole basis for treatment or other patient management decisions. A negative result may occur with  improper specimen collection/handling, submission of specimen other than nasopharyngeal swab, presence of viral mutation(s) within the areas targeted by this assay, and inadequate number of viral copies(<138 copies/mL). A negative result must be combined with clinical observations, patient history, and epidemiological information. The expected result is Negative.  Fact Sheet for Patients:  bloggercourse.com  Fact Sheet for Healthcare Providers:  seriousbroker.it  This test is no t yet approved or cleared by the [place]  FDA and  has been authorized for detection and/or diagnosis of SARS-CoV-2 by FDA under an Emergency Use Authorization (EUA). This EUA will remain  in effect (meaning this test can be used) for the duration of the COVID-19 declaration under Section 564(b)(1) of the Act, 21 U.S.C.section 360bbb-3(b)(1), unless the authorization  is terminated  or revoked sooner.       Influenza A by PCR NEGATIVE NEGATIVE Final   Influenza B by PCR NEGATIVE NEGATIVE Final    Comment: (NOTE) The Xpert Xpress SARS-CoV-2/FLU/RSV plus assay is intended as an aid in the diagnosis of influenza from Nasopharyngeal swab specimens and should not be used as a sole basis for treatment. Nasal washings and aspirates are unacceptable for Xpert Xpress SARS-CoV-2/FLU/RSV testing.  Fact Sheet for Patients: bloggercourse.com  Fact Sheet for Healthcare  Providers: seriousbroker.it  This test is not yet approved or cleared by the [place]  FDA and has been authorized for detection and/or diagnosis of SARS-CoV-2 by FDA under an Emergency Use Authorization (EUA). This EUA will remain in effect (meaning this test can be used) for the duration of the COVID-19 declaration under Section 564(b)(1) of the Act, 21 U.S.C. section 360bbb-3(b)(1), unless the authorization is terminated or revoked.     Resp Syncytial Virus by PCR NEGATIVE NEGATIVE Final    Comment: (NOTE) Fact Sheet for Patients: bloggercourse.com  Fact Sheet for Healthcare Providers: seriousbroker.it  This test is not yet approved or cleared by the [place]  FDA and has been authorized for detection and/or diagnosis of SARS-CoV-2 by FDA under an Emergency Use Authorization (EUA). This EUA will remain in effect (meaning this test can be used) for the duration of the COVID-19 declaration under Section 564(b)(1) of the Act, 21 U.S.C. section 360bbb-3(b)(1), unless the authorization is terminated or revoked.  Performed at [facility], [address], [place], [place] [address]   Culture, blood (Routine x 2)     Status: None   Collection Time: 02/16/24  5:57 PM   Specimen: BLOOD  Result Value Ref Range Status   Specimen Description   Final    BLOOD RIGHT ANTECUBITAL Performed at [facility], [address] [place], [place] [address]    Special Requests   Final    BOTTLES DRAWN AEROBIC AND ANAEROBIC Blood Culture adequate volume Performed at [facility], [address] [place], [place] [address]    Culture   Final    NO GROWTH 5 DAYS Performed at [facility], [address], [name], [place] [address]    Report Status 02/21/2024 FINAL  Final  MRSA Next Gen by PCR, Nasal     Status: None   Collection Time: 02/16/24  9:04  PM   Specimen: Nasal Mucosa; Nasal Swab  Result Value Ref Range Status   MRSA by PCR Next Gen NOT DETECTED NOT DETECTED Final    Comment: (NOTE) The GeneXpert MRSA Assay (FDA approved for NASAL specimens only), is one component of a comprehensive MRSA colonization surveillance program. It is not intended to diagnose MRSA infection nor to guide or monitor treatment for MRSA infections. Test performance is not FDA approved in patients less than 4 years old. Performed at [facility], [address], [place], [place] [address]   Respiratory (~20 pathogens) panel by PCR     Status: None   Collection Time: 02/16/24 11:50 PM  Result Value Ref Range Status   Adenovirus NOT DETECTED NOT DETECTED Final   Coronavirus 229E NOT DETECTED NOT DETECTED Final    Comment: (NOTE) The Coronavirus on the Respiratory Panel, DOES NOT test for the novel  Coronavirus (2019 nCoV)    Coronavirus HKU1 NOT DETECTED NOT DETECTED Final   Coronavirus NL63 NOT DETECTED NOT DETECTED Final   Coronavirus OC43 NOT DETECTED NOT  DETECTED Final   Metapneumovirus NOT DETECTED NOT DETECTED Final   Rhinovirus / Enterovirus NOT DETECTED NOT DETECTED Final   Influenza A NOT DETECTED NOT DETECTED Final   Influenza B NOT DETECTED NOT DETECTED Final   Parainfluenza Virus 1 NOT DETECTED NOT DETECTED Final   Parainfluenza Virus 2 NOT DETECTED NOT DETECTED Final   Parainfluenza Virus 3 NOT DETECTED NOT DETECTED Final   Parainfluenza Virus 4 NOT DETECTED NOT DETECTED Final   Respiratory Syncytial Virus NOT DETECTED NOT DETECTED Final   Bordetella pertussis NOT DETECTED NOT DETECTED Final   Bordetella Parapertussis NOT DETECTED NOT DETECTED Final   Chlamydophila pneumoniae NOT DETECTED NOT DETECTED Final   Mycoplasma pneumoniae NOT DETECTED NOT DETECTED Final    Comment: Performed at [facility], [address] [place], [place] [address]    Labs: CBC: Recent Labs  Lab 02/16/24 1745  02/17/24 0322 02/19/24 0539  WBC 27.5* 23.6* 11.7*  NEUTROABS 25.9*  --   --   HGB 11.7* 9.8* 10.7*  HCT 37.2* 30.7* 33.5*  MCV 103.9* 103.4* 100.6*  PLT 299 173 234   Basic Metabolic Panel: Recent Labs  Lab 02/16/24 1745 02/16/24 2104 02/17/24 0322 02/17/24 1910 02/18/24 0545 02/19/24 0539 02/20/24 0436 02/21/24 0912 02/22/24 0413  NA 142  --  143  --   --  137 138 136  --   K 4.4  --  4.5  --   --  4.3 4.2 4.4  --   CL 104  --  111  --   --  106 105 101  --   CO2 27  --  24  --   --  26 26 29   --   GLUCOSE 227*  --  198*  --   --  105* 137* 149*  --   BUN 23  --  21  --   --  12 11 12   --   CREATININE 1.06  --  0.88  --   --  0.68 0.69 0.80  --   CALCIUM  8.7*  --  7.9*  --   --  7.9* 8.0* 8.2*  --   MG  --    < >  --    < > 2.2 1.9 1.9 1.9 1.9  PHOS  --    < >  --    < > 2.3* 2.4* 2.2* 2.8 2.3*   < > = values in this interval not displayed.   Liver Function Tests: Recent Labs  Lab 02/16/24 1745  AST 36  ALT 24  ALKPHOS 180*  BILITOT 1.3*  PROT 6.8  ALBUMIN 3.0*   CBG: Recent Labs  Lab 02/22/24 0303  GLUCAP 138*    Discharge time spent: greater than 30 minutes.  Signed: Toribio Door, MD [facility] 02/23/2024 "

## 2024-02-24 ENCOUNTER — Encounter: Payer: Self-pay | Admitting: Cardiology

## 2024-03-20 DEATH — deceased

## 2024-03-22 ENCOUNTER — Ambulatory Visit: Admitting: Cardiology
